# Patient Record
Sex: Male | Born: 1995
Health system: Southern US, Community
[De-identification: ages and names within clinical notes are randomized; demographics above are authoritative.]

## PROBLEM LIST (undated history)

## (undated) DIAGNOSIS — G43909 Migraine, unspecified, not intractable, without status migrainosus: Secondary | ICD-10-CM

## (undated) DIAGNOSIS — E119 Type 2 diabetes mellitus without complications: Secondary | ICD-10-CM

## (undated) DIAGNOSIS — F259 Schizoaffective disorder, unspecified: Secondary | ICD-10-CM

---

## 2020-08-25 ENCOUNTER — Encounter (HOSPITAL_COMMUNITY): Payer: Self-pay | Admitting: Emergency Medicine

## 2020-08-25 ENCOUNTER — Emergency Department (HOSPITAL_COMMUNITY): Payer: Managed Care, Other (non HMO)

## 2020-08-25 ENCOUNTER — Ambulatory Visit (HOSPITAL_COMMUNITY)
Admission: EM | Admit: 2020-08-25 | Discharge: 2020-08-25 | Disposition: A | Discharge status: Other Acute Inpatient Hospital | Payer: Managed Care, Other (non HMO)

## 2020-08-25 ENCOUNTER — Other Ambulatory Visit: Payer: Self-pay

## 2020-08-25 ENCOUNTER — Emergency Department (HOSPITAL_COMMUNITY)
Admission: EM | Admit: 2020-08-25 | Discharge: 2020-08-25 | Discharge status: Against Medical Advice | Payer: Managed Care, Other (non HMO) | Attending: Emergency Medicine | Admitting: Emergency Medicine

## 2020-08-25 DIAGNOSIS — R45850 Homicidal ideations: Secondary | ICD-10-CM | POA: Diagnosis not present

## 2020-08-25 DIAGNOSIS — R45851 Suicidal ideations: Secondary | ICD-10-CM | POA: Diagnosis not present

## 2020-08-25 DIAGNOSIS — F29 Unspecified psychosis not due to a substance or known physiological condition: Secondary | ICD-10-CM | POA: Diagnosis not present

## 2020-08-25 DIAGNOSIS — Z20822 Contact with and (suspected) exposure to covid-19: Secondary | ICD-10-CM | POA: Insufficient documentation

## 2020-08-25 DIAGNOSIS — R519 Headache, unspecified: Secondary | ICD-10-CM | POA: Insufficient documentation

## 2020-08-25 DIAGNOSIS — Z0279 Encounter for issue of other medical certificate: Secondary | ICD-10-CM | POA: Diagnosis present

## 2020-08-25 DIAGNOSIS — F23 Brief psychotic disorder: Secondary | ICD-10-CM | POA: Diagnosis not present

## 2020-08-25 HISTORY — DX: Migraine, unspecified, not intractable, without status migrainosus: G43.909

## 2020-08-25 LAB — COMPREHENSIVE METABOLIC PANEL
ALT: 17 U/L (ref 0–44)
AST: 21 U/L (ref 15–41)
Albumin: 4.2 g/dL (ref 3.5–5.0)
Alkaline Phosphatase: 59 U/L (ref 38–126)
Anion gap: 11 (ref 5–15)
BUN: 12 mg/dL (ref 6–20)
CO2: 26 mmol/L (ref 22–32)
Calcium: 9.1 mg/dL (ref 8.9–10.3)
Chloride: 101 mmol/L (ref 98–111)
Creatinine, Ser: 0.92 mg/dL (ref 0.61–1.24)
GFR, Estimated: >60 mL/min (ref >60)
Glucose, Bld: 95 mg/dL (ref 70–99)
Potassium: 4.1 mmol/L (ref 3.5–5.1)
Sodium: 138 mmol/L (ref 135–145)
Total Bilirubin: 0.7 mg/dL (ref 0.3–1.2)
Total Protein: 7 g/dL (ref 6.5–8.1)

## 2020-08-25 LAB — CBC
HCT: 45.6 % (ref 39.0–52.0)
Hemoglobin: 14.8 g/dL (ref 13.0–17.0)
MCH: 28.5 pg (ref 26.0–34.0)
MCHC: 32.5 g/dL (ref 30.0–36.0)
MCV: 87.7 fL (ref 80.0–100.0)
Platelets: 293 10*3/uL (ref 150–400)
RBC: 5.2 MIL/uL (ref 4.22–5.81)
RDW: 12.1 % (ref 11.5–15.5)
WBC: 8.2 10*3/uL (ref 4.0–10.5)
nRBC: 0 % (ref 0.0–0.2)

## 2020-08-25 LAB — RESPIRATORY PANEL BY RT PCR (FLU A&B, COVID)
Influenza A by PCR: NEGATIVE
Influenza B by PCR: NEGATIVE
SARS Coronavirus 2 by RT PCR: NEGATIVE

## 2020-08-25 LAB — ACETAMINOPHEN LEVEL: Acetaminophen (Tylenol), Serum: <10 ug/mL — ABNORMAL LOW (ref 10–30)

## 2020-08-25 LAB — SALICYLATE LEVEL: Salicylate Lvl: <7 mg/dL — ABNORMAL LOW (ref 7.0–30.0)

## 2020-08-25 LAB — RAPID URINE DRUG SCREEN, HOSP PERFORMED
Amphetamines: NOT DETECTED
Barbiturates: NOT DETECTED
Benzodiazepines: NOT DETECTED
Cocaine: NOT DETECTED
Opiates: NOT DETECTED
Tetrahydrocannabinol: NOT DETECTED

## 2020-08-25 LAB — TSH: TSH: 1.019 u[IU]/mL (ref 0.350–4.500)

## 2020-08-25 LAB — ETHANOL: Alcohol, Ethyl (B): <10 mg/dL (ref <10)
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
## 2020-08-25 NOTE — ED Notes (Signed)
Pt sent to [facility] via safe transport for medical clearance. Safety maintained.

## 2020-08-25 NOTE — ED Triage Notes (Signed)
24 yo caucasian male presents with family stating, "I have a lot of pressure in my head. I feel like my brain expands and decreases. This started about a little over a month now. When the migraines come on, I start having spiritual encounters as if electricity is going through my body. The headaches are in the frontal lobes of my head and that's when the dissociation happens. I need to be more balanced". Pt's father states the pt have become more unbalanced stating the pt say that he is afraid he is going to hurt both of his parents, that the pt can touch people and burn them and that people are after him. The father reports that the pt think his parents are part of the Illuminati. Parents deny psychosis in family.

## 2020-08-25 NOTE — ED Notes (Signed)
Patient does not have a locker. 

## 2020-08-25 NOTE — ED Provider Notes (Signed)
[facility] EMERGENCY DEPARTMENT Provider Note   CSN: [id number] Arrival date & time: 08/25/20  1506     History Chief Complaint  Patient presents with   Migraine    Scott Vargas is a 24 y.o. male.  HPI Patient presents from behavioral health Hospital with psychosis. Sent for medical clearance. Patient states that he had a spiritual awakening in July of last year. States he felt as if lining went to his head and went through to his toes. Since then he has had headaches in the front of his head. States it feels that there is a throbbing in there. No numbness weakness. No confusion. Although he states he is also started having hallucinations where he will hear voices and sometimes see some things. Reviewing notes from behavioral health urgent care shows that he has had psychosis and reportedly has been worried that he would hurt his family. Reportedly is also made some homicidal and suicidal statements. Patient denies substance abuse. No previous psychiatric history. Patient states he is always been spiritual. Denies drug use.    Past Medical History:  Diagnosis Date   Migraine     There are no problems to display for this patient.        No family history on file.  Social History   Tobacco Use   Smoking status: Not on file  Substance Use Topics   Alcohol use: Not on file   Drug use: Not on file    Home Medications Prior to Admission medications   Not on File    Allergies    Patient has no known allergies.  Review of Systems   Review of Systems  Constitutional: Negative for chills and fever.  HENT: Negative for congestion.   Respiratory: Negative for shortness of breath.   Cardiovascular: Negative for chest pain.  Gastrointestinal: Negative for abdominal pain.  Genitourinary: Negative for flank pain.  Musculoskeletal: Negative for back pain.  Skin: Negative for rash.  Neurological: Positive for headaches. Negative for weakness.   Hematological: Does not bruise/bleed easily.  Psychiatric/Behavioral: Positive for hallucinations and suicidal ideas.    Physical Exam Updated Vital Signs BP (!) 160/106 (BP Location: Right Arm)    Pulse 88    Temp 98.5 F (36.9 C) (Oral)    Resp 16    Ht 5\' 10"  (1.778 m)    Wt 77.1 kg    SpO2 99%    BMI 24.39 kg/m   Physical Exam Vitals and nursing note reviewed.  Constitutional:      Appearance: Normal appearance.  HENT:     Head: Atraumatic.  Eyes:     Pupils: Pupils are equal, round, and reactive to light.  Cardiovascular:     Rate and Rhythm: Normal rate and regular rhythm.  Pulmonary:     Effort: Pulmonary effort is normal.  Abdominal:     Tenderness: There is no abdominal tenderness.  Skin:    General: Skin is warm.     Capillary Refill: Capillary refill takes less than 2 seconds.  Neurological:     Mental Status: He is alert and oriented to person, place, and time.  Psychiatric:     Comments: Patient is awake and answers questions appropriately. Does not appear to be responding to internal stimuli.     ED Results / Procedures / Treatments   Labs (all labs ordered are listed, but only abnormal results are displayed) Labs Reviewed  SALICYLATE LEVEL - Abnormal; Notable for the following components:  Result Value   Salicylate Lvl <7.0 (*)    All other components within normal limits  ACETAMINOPHEN LEVEL - Abnormal; Notable for the following components:   Acetaminophen (Tylenol), Serum <10 (*)    All other components within normal limits  RESPIRATORY PANEL BY RT PCR (FLU A&B, COVID)  COMPREHENSIVE METABOLIC PANEL  ETHANOL  CBC  RAPID URINE DRUG SCREEN, HOSP PERFORMED  TSH    EKG None  Radiology CT Head Wo Contrast  Result Date: 08/25/2020 CLINICAL DATA:  Psychosis EXAM: CT HEAD WITHOUT CONTRAST TECHNIQUE: Contiguous axial images were obtained from the base of the skull through the vertex without intravenous contrast. COMPARISON:  None. FINDINGS:  Brain: No evidence of acute infarction, hemorrhage, hydrocephalus, extra-axial collection, visible mass lesion or mass effect. Vascular: No hyperdense vessel or unexpected calcification. Skull: No calvarial fracture or suspicious osseous lesion. No scalp swelling or hematoma. Sinuses/Orbits: Paranasal sinuses and mastoid air cells are predominantly clear. Included orbital structures are unremarkable. Other: None. IMPRESSION: Normal noncontrast CT of the head. Electronically Signed   By: Kreg Shropshire M.D.   On: 08/25/2020 19:41    Procedures Procedures (including critical care time)  Medications Ordered in ED Medications - No data to display  ED Course  I have reviewed the triage vital signs and the nursing notes.  Pertinent labs & imaging results that were available during my care of the patient were reviewed by me and considered in my medical decision making (see chart for details).    MDM Rules/Calculators/A&P                          Patient presents with psychosis.  Transferred from behavioral health urgent care.  New onset psychosis.  Risks of suicidal and homicidal.  Has been seen behavioral health.  Lab work reassuring.  Medically cleared.  To be seen by TTS and likely needs inpatient treatment. Final Clinical Impression(s) / ED Diagnoses Final diagnoses:  Psychosis, unspecified psychosis type [facility])    Rx / DC Orders ED Discharge Orders    None       Benjiman Core, MD 08/26/20 0025
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
## 2020-08-25 NOTE — ED Triage Notes (Signed)
Pt referred to ED by [facility] to get a CT to r/o any head problem. Pt initially when there to ask for some anxiety medication.

## 2020-08-25 NOTE — ED Notes (Signed)
Notified by other staff and brother that pt has left the facility. Pt ambulated out independently. Brother attempted to convince pt to stay but pt refused.

## 2020-08-25 NOTE — Discharge Instructions (Signed)
Discharge to the [facility]

## 2020-10-30 ENCOUNTER — Ambulatory Visit: Payer: 59 | Admitting: Psychiatry

## 2020-12-14 ENCOUNTER — Ambulatory Visit: Payer: Managed Care, Other (non HMO) | Admitting: Psychiatry

## 2020-12-27 ENCOUNTER — Ambulatory Visit: Payer: Managed Care, Other (non HMO) | Admitting: Neurology

## 2020-12-29 ENCOUNTER — Ambulatory Visit (INDEPENDENT_AMBULATORY_CARE_PROVIDER_SITE_OTHER): Payer: Managed Care, Other (non HMO) | Admitting: Psychiatry

## 2020-12-29 ENCOUNTER — Other Ambulatory Visit: Payer: Self-pay

## 2020-12-29 ENCOUNTER — Encounter: Payer: Self-pay | Admitting: Psychiatry

## 2020-12-29 VITALS — BP 145/85 | HR 75 | Ht 69.5 in | Wt 196.0 lb

## 2020-12-29 DIAGNOSIS — F259 Schizoaffective disorder, unspecified: Secondary | ICD-10-CM

## 2020-12-29 MED ORDER — DIVALPROEX SODIUM ER 500 MG PO TB24: ORAL_TABLET | ORAL | 1 refills | Status: DC

## 2020-12-29 NOTE — Progress Notes (Signed)
[facility] MD/PA/NP Initial Note  12/29/2020 9:24 AM Scott Vargas  MRN:  [id number]  Chief Complaint:  Chief Complaint    Anxiety; Paranoid; Schizoaffective Disorder      HPI:   Currently in IOP and was in PHP and psych hosp in Jan.   Paranoia and anxiety and head pressure which is annoying.  Haven't been able to connect to people.  Feels people are out to get him.  Super wound up and I need to chill out.  Only thing that helps is smoking weed and then only with friends.  Now only smokes cigars.   Social skills awful. Dream a lot.  Trazodone and prazosin help NM.   Sleep good 6-8 hours daily. Previously voice giving him ideas and thoughts.   July 25 ,20220 spirtiual encounter and it changed my brain. Way too emotional.   Then started [employer] and had intense spiritual experiences.   Depressed over the last couple of months just hanging out at home. 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   Voice is much better and not daily now.    Paranoia is better now gone.  Still stressed out going to the grocery store and super self aware. Had SI for a lot of his life and don't know why.  History of violent dreams.  Better place than a couple of months ago.  Sometimes violent thoughts but not now.  Was communicating threats a couple of months ago but never hurt anyone.  Does not want to hurt anyone now.   November 169# and now 195#.  Doing a litte working out.  Fatigue has limited working out.  Reviewed the meds.  When awakens feels a bit in a tunnel and it takes a while to wake up. Fatigue for months.  Downhill slide started Oct-November leading to hosptial at [facility].  Dx schizoaffective.    Some mental health issues his whole life but never this bad.     Visit Diagnosis: No diagnosis found.  Past Psychiatric History:  Past Psychiatric Medication Trials: Lorazepam Propranolol Lamotrigine No Depakote or  lithium  Past Medical History:  Past Medical History:  Diagnosis Date  . Migraine    History reviewed. No pertinent surgical history.  Family Psychiatric History: pat aunt bipolar   Family History: History reviewed. No pertinent family history. 3 brothers.  Social History:  Social History   Socioeconomic History  . Marital status: Single    Spouse name: Not on file  . Number of children: Not on file  . Years of education: Not on file  . Highest education level: Not on file  Occupational History  . Not on file  Tobacco Use  . Smoking status: Not on file  . Smokeless tobacco: Not on file  Substance and Sexual Activity  . Alcohol use: Not on file  . Drug use: Not on file  . Sexual activity: Not on file  Other Topics Concern  . Not on file  Social History Narrative  . Not on file   Social Determinants of Health   Financial Resource Strain: Not on file  Food Insecurity: Not on file  Transportation Needs: Not on file  Physical Activity: Not on file  Stress: Not on file  Social Connections: Not on file    Allergies: No Known Allergies  Metabolic Disorder Labs: No results found for: HGBA1C, MPG No results found for: PROLACTIN No results found for:  CHOL, TRIG, HDL, CHOLHDL, VLDL, LDLCALC Lab Results  Component Value Date   TSH 1.019 08/25/2020    Therapeutic Level Labs: No results found for: LITHIUM No results found for: VALPROATE No components found for:  CBMZ  Current Medications: Current Outpatient Medications  Medication Sig Dispense Refill  . hydrOXYzine (VISTARIL) 50 MG capsule Take 50 mg by mouth 3 (three) times daily as needed. 1 tab orally once a day.    . prazosin (MINIPRESS) 2 MG capsule Take 2 mg by mouth at bedtime. Pt is not sure about the mg of this medication    . risperiDONE (RISPERDAL) 2 MG tablet Take 2 mg by mouth at bedtime. 1/2 tablet once a week.    . traZODone (DESYREL) 100 MG tablet Take 100 mg by mouth at bedtime. Pt is not sure  about the mg of this medication.     No current facility-administered medications for this visit.    Medication Side Effects: fatigue/weakness  Orders placed this visit:  No orders of the defined types were placed in this encounter.   Psychiatric Specialty Exam:  Review of Systems  Constitutional: Positive for fatigue. Negative for appetite change, diaphoresis and fever.  HENT: Negative for congestion, hearing loss, tinnitus and voice change.   Eyes: Positive for photophobia. Negative for visual disturbance.  Respiratory: Negative for chest tightness, shortness of breath and wheezing.   Cardiovascular: Negative for chest pain and palpitations.  Gastrointestinal: Negative for constipation, diarrhea, nausea and vomiting.  Endocrine: Negative for polyuria.  Genitourinary: Positive for difficulty urinating. Negative for enuresis, frequency and hematuria.  Musculoskeletal: Negative for back pain, gait problem and neck pain.  Skin: Negative for rash.  Allergic/Immunologic: Negative for environmental allergies.  Neurological: Positive for headaches. Negative for tremors and syncope.  Psychiatric/Behavioral: Positive for agitation, confusion, decreased concentration, dysphoric mood and hallucinations. Negative for self-injury. The patient is nervous/anxious.     Blood pressure (!) 145/85, pulse 75, height 5' 9.5" (1.765 m), weight 196 lb (88.9 kg).Body mass index is 28.53 kg/m.  General Appearance: mildly disorganized  Eye Contact:  Fair  Speech:  Normal Rate and Talkative  Volume:  Normal  Mood:  Anxious and Dysphoric  Affect:  Restricted  Thought Process:  Goal Directed  Orientation:  Full (Time, Place, and Person)  Thought Content: Illogical, Hallucinations: Auditory and Paranoid Ideation   Suicidal Thoughts:  No  Homicidal Thoughts:  some intrusive violent thought without desire to harm anyone  Memory:  WNL  Judgement:  Fair  Insight:  Fair  Psychomotor Activity:  Restlessness   Concentration:  Concentration: Fair  Recall:  Good  Fund of Knowledge: Good  Language: Good  Assets:  Desire for Improvement Housing Intimacy Leisure Time Physical Health Social Support  ADL's:  Intact  Cognition: WNL  Prognosis:  Fair   Screenings: MDQ positive throughout.  Receiving Psychotherapy: IOP  Treatment Plan/Recommendations: Met with the patient alone to discuss diagnosis and treatment plan.  Then met again with the parents to discuss the same.  We will meet again next time to discuss this in more thoroughness.  Reviewed documents provided about hospitalization and medications.  Reviewed his recent hospital records and work-up.  This describes new onset of psychosis.  The patient was diagnosed with schizoaffective disorder which seems consistent with the combination of psychotic symptoms and some mood symptoms of irritability although the diagnosis may be further refined as time goes on.  He has had a partial response to [place] sustained injections monthly.  Discussed the  pros and cons of long-acting injectables.  It is important to keep his psychosis under control.  However it is not as well-controlled as we would like.  At follow-up we will discuss alternatives.  He is having some side effects.  Typically with schizoaffective disorder we will combine an antipsychotic with mood stabilizer.  He has some irritability and intrusive angry thoughts.  Depakote may be helpful for this.  We will initiate low-dose Depakote.  Discussed the side effects in detail.  His next Invega injection is due on March 12 and he will receive it.  He agrees to this plan.  Discussed potential metabolic side effects associated with atypical antipsychotics, as well as potential risk for movement side effects. Advised pt to contact office if movement side effects occur.   FU ASAP as workin bc of urgency  Lynder Parents, MD, DFAPA     Purnell Shoemaker, MD

## 2020-12-31 ENCOUNTER — Encounter: Payer: Self-pay | Admitting: Psychiatry

## 2021-01-03 ENCOUNTER — Ambulatory Visit: Payer: Managed Care, Other (non HMO) | Admitting: Psychiatry

## 2021-01-05 ENCOUNTER — Telehealth: Payer: Self-pay | Admitting: Psychiatry

## 2021-01-05 ENCOUNTER — Other Ambulatory Visit: Payer: Self-pay

## 2021-01-05 MED ORDER — TRAZODONE HCL 50 MG PO TABS
50.0000 mg | ORAL_TABLET | Freq: Every day | ORAL | 0 refills | Status: DC
Start: 2021-01-05 — End: 2021-04-03

## 2021-01-05 NOTE — Telephone Encounter (Signed)
Rx sent 

## 2021-01-05 NOTE — Telephone Encounter (Signed)
Pt would like a refill for Trazadone 50mg . Please send to [facility] [address] in [place], [place].  Father said that pt had his first appt last week and that all his medications will be handled by Dr. [place].

## 2021-01-12 ENCOUNTER — Ambulatory Visit (INDEPENDENT_AMBULATORY_CARE_PROVIDER_SITE_OTHER): Payer: 59 | Admitting: Psychiatry

## 2021-01-12 ENCOUNTER — Other Ambulatory Visit: Payer: Self-pay | Admitting: Psychiatry

## 2021-01-12 ENCOUNTER — Other Ambulatory Visit: Payer: Self-pay

## 2021-01-12 ENCOUNTER — Encounter: Payer: Self-pay | Admitting: Psychiatry

## 2021-01-12 DIAGNOSIS — F515 Nightmare disorder: Secondary | ICD-10-CM | POA: Diagnosis not present

## 2021-01-12 DIAGNOSIS — F259 Schizoaffective disorder, unspecified: Secondary | ICD-10-CM

## 2021-01-12 MED ORDER — INVEGA SUSTENNA 234 MG/1.5ML IM SUSY: 234.0000 mg | PREFILLED_SYRINGE | Freq: Once | INTRAMUSCULAR | 0 refills | Status: DC

## 2021-01-12 MED ORDER — HYDROXYZINE PAMOATE 50 MG PO CAPS: 50.0000 mg | ORAL_CAPSULE | Freq: Three times a day (TID) | ORAL | 0 refills | Status: DC | PRN

## 2021-01-12 MED ORDER — PRAZOSIN HCL 2 MG PO CAPS: 2.0000 mg | ORAL_CAPSULE | Freq: Every day | ORAL | 0 refills | Status: DC

## 2021-01-12 MED ORDER — DIVALPROEX SODIUM ER 500 MG PO TB24: ORAL_TABLET | ORAL | 1 refills | Status: DC

## 2021-01-12 NOTE — Progress Notes (Signed)
Scott Vargas [id number] 09-21-1996 25 y.o.  Subjective:   Patient ID:  Scott Vargas is a 25 y.o. (DOB [birth year]) male.  Chief Complaint:  Chief Complaint  Patient presents with  . Schizoaffective disorder, unspecified type (HCC)  . Follow-up    HPI Scott Vargas presents to the office today for follow-up of schizoaffective disorder with recent hospitalizations. First seen in our office 12/29/2020.  His symptoms were improved but not resolved and included depression, paranoia, intrusive violent images, chronic suicidal thoughts. He was just out of the hospital so no medications were in changed which included Invega IM every 4 weeks, risperidone 2 mg nightly, prazosin 2 mg nightly, trazodone 100 mg nightly, hydroxyzine 50 mg 3 times daily as needed  01/12/2021 appointment with the following noted: Working and going pretty smooth.  Scott Vargas out with friends and it went better than I thought. 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   No pot use since here. Mood pretty chill. Still gets voices negative and positive like "people don't like you".  Voices come and go.  Dealt with it a long time but never told anyone.  Better if busy.   Not sure about character of voices.  They come to me.  Worse when not busy and any time of day.  Voices are frustrating.  Voices got worse July 2020 in sprittual experience.  Not depressed.  Pleased friends accepted him better than expected. Attending young adults group at [facility] which is more centered spiriitually.   Still seeing therapist. Some people bother him but not sure why.  Trying to make new friends. In [place] got 2 restraining orders from 2 different churches.  No outstanding charges. Alcohol 2 -3 per week. Started Depakote without problems.  Pretty well with anger. Sleep OK with mixed type.  NM awaken him once weekly. No SE.  Then with parents Scott Vargas & dad Not as depressed as he was. M & F notes increase anxiety as got closer to injection  date.  F sees it's "night and day"  Difference and I have my son back. M agrees he's exhibited bravery in doing difficult things. Last injection date March 12- day outpatient [facility] [place] [place].   Past Psychiatric Medication Trials: History Dr. Krista Blue outpatient [facility] hosp 10/2020 Lorazepam Propranolol Lamotrigine Invega sustaina started Jan 2022 in hospital [facility] Risperidone 6 Abilify 10 NR and wt gain Lithium 1200 in 2020 level 0.8 Trazodone, hydroxyzine, prazosin No Depakote or lithium  Review of Systems:  Review of Systems  Cardiovascular: Negative for palpitations.  Neurological: Negative for tremors and weakness.    Medications: I have reviewed the patient's current medications.  Current Outpatient Medications  Medication Sig Dispense Refill  . traZODone (DESYREL) 50 MG tablet Take 1 tablet (50 mg total) by mouth at bedtime. 90 tablet 0  . divalproex (DEPAKOTE ER) 500 MG 24 hr tablet 2 at night 180 tablet 1  . hydrOXYzine (VISTARIL) 50 MG capsule Take 1 capsule (50 mg total) by mouth 3 (three) times daily as needed. 90 capsule 0  . paliperidone (INVEGA SUSTENNA) 234 MG/1.5ML SUSY injection Inject 234 mg into the muscle once for 1 dose. Every 3 weeks, next due 01/27/21 1.5 mL 0  . prazosin (MINIPRESS) 2 MG capsule Take 1 capsule (2 mg total) by mouth at bedtime. Pt is not sure about the mg of this medication 90 capsule 0  . risperiDONE (RISPERDAL) 2 MG tablet Take 2 mg by mouth at bedtime. 1/2 tablet once a week prn (Patient not  taking: Reported on 01/12/2021)     No current facility-administered medications for this visit.    Medication Side Effects: None  Allergies: No Known Allergies  Past Medical History:  Diagnosis Date  . Migraine     History reviewed. No pertinent family history.  Social History   Socioeconomic History  . Marital status: Single    Spouse name: Not on file  . Number of children: Not on file  . Years of education: Not on file  .  Highest education level: Not on file  Occupational History  . Not on file  Tobacco Use  . Smoking status: Light Tobacco Smoker  . Smokeless tobacco: Never Used  Substance and Sexual Activity  . Alcohol use: Not on file  . Drug use: Not on file  . Sexual activity: Not on file  Other Topics Concern  . Not on file  Social History Narrative  . Not on file   Social Determinants of Health   Financial Resource Strain: Not on file  Food Insecurity: Not on file  Transportation Needs: Not on file  Physical Activity: Not on file  Stress: Not on file  Social Connections: Not on file  Intimate Partner Violence: Not on file    Past Medical History, Surgical history, Social history, and Family history were reviewed and updated as appropriate.   Fraternal twin brother 2 other brothers  Please see review of systems for further details on the patient's review from today.   Objective:   Physical Exam:  There were no vitals taken for this visit.  Physical Exam Constitutional:      General: He is not in acute distress. Musculoskeletal:        General: No deformity.  Neurological:     Mental Status: He is alert and oriented to person, place, and time.     Coordination: Coordination normal.  Psychiatric:        Attention and Perception: He is inattentive. He perceives auditory hallucinations. He does not perceive visual hallucinations.        Mood and Affect: Mood is anxious. Mood is not depressed. Affect is not labile, blunt, angry, tearful or inappropriate.        Speech: Speech normal.        Behavior: Behavior normal.        Thought Content: Thought content is paranoid. Thought content is not delusional. Thought content does not include homicidal or suicidal ideation. Thought content does not include homicidal or suicidal plan.        Cognition and Memory: Cognition and memory normal.        Judgment: Judgment normal.     Comments: Insight intact Mild paranoia better Mild  inattention     Lab Review:     Component Value Date/Time   NA 138 08/25/2020 1522   K 4.1 08/25/2020 1522   CL 101 08/25/2020 1522   CO2 26 08/25/2020 1522   GLUCOSE 95 08/25/2020 1522   BUN 12 08/25/2020 1522   CREATININE 0.92 08/25/2020 1522   CALCIUM 9.1 08/25/2020 1522   PROT 7.0 08/25/2020 1522   ALBUMIN 4.2 08/25/2020 1522   AST 21 08/25/2020 1522   ALT 17 08/25/2020 1522   ALKPHOS 59 08/25/2020 1522   BILITOT 0.7 08/25/2020 1522   GFRNONAA >60 08/25/2020 1522       Component Value Date/Time   WBC 8.2 08/25/2020 1522   RBC 5.20 08/25/2020 1522   HGB 14.8 08/25/2020 1522   HCT 45.6 08/25/2020 1522  PLT 293 08/25/2020 1522   MCV 87.7 08/25/2020 1522   MCH 28.5 08/25/2020 1522   MCHC 32.5 08/25/2020 1522   RDW 12.1 08/25/2020 1522    No results found for: POCLITH, LITHIUM   No results found for: PHENYTOIN, PHENOBARB, VALPROATE, CBMZ   .res Assessment: Plan:    Jarrah was seen today for schizoaffective disorder, unspecified type (hcc) and follow-up.  Diagnoses and all orders for this visit:  Schizoaffective disorder, unspecified type (HCC) -     divalproex (DEPAKOTE ER) 500 MG 24 hr tablet; 2 at night -     paliperidone (INVEGA SUSTENNA) 234 MG/1.5ML SUSY injection; Inject 234 mg into the muscle once for 1 dose. Every 3 weeks, next due 01/27/21 -     Discontinue: hydrOXYzine (VISTARIL) 50 MG capsule; Take 1 capsule (50 mg total) by mouth 3 (three) times daily as needed. 1 tab orally once a day.  Nightmare -     prazosin (MINIPRESS) 2 MG capsule; Take 1 capsule (2 mg total) by mouth at bedtime. Pt is not sure about the mg of this medication   Greater than 50% of 60 min face to face time with patient was spent on counseling and coordination of care. We extensively discussed the new diagnosis of schizoaffective disorder with the patient and his parents.  We discussed prognosis.  We discussed factors such as marijuana use which could worsen outcome.  We discussed  the importance of med compliance and how that can change future outcomes with psychotic symptoms as well as relapse risk.  We discussed side effects of antipsychotic medications.  We discussed the trial and error nature of finding the optimal antipsychotic at optimal dose.  Answered other questions that the patient's parents had  Overall his psychotic symptoms are much improved but not resolved.   Normal Head CT scan.  They asked whether further neuro work-up was indicated and there does not appear to be focal neurologic signs or symptoms to suggest that.  IOP is winding down next week. Rec continued therapy individually.  Support groups [facility], [facility], [facility]  Continue meds.  Get next [place] 1 week early by 1 week for a couple of mos DT observed wearing off.  We discussed that it can take several months for long-acting injectables to have optimal effect and reach steady state.  He is tolerating the antipsychotic well.  He is tolerating the Depakote well and is not currently having violent thoughts   Tobacco use only rarely bc DDI  Supportive therapy for pt and family dealing with his new onset psychosis.  FU 6 weeks  Meredith Staggers, MD, DFAPA   Please see After Visit Summary for patient specific instructions.  Future Appointments  Date Time Provider Department Center  03/07/2021  1:30 PM Cottle, Steva Ready., MD [facility] None    No orders of the defined types were placed in this encounter.   -------------------------------

## 2021-01-15 NOTE — Telephone Encounter (Signed)
Can you update directions

## 2021-01-27 ENCOUNTER — Other Ambulatory Visit: Payer: Self-pay | Admitting: Psychiatry

## 2021-01-27 DIAGNOSIS — F259 Schizoaffective disorder, unspecified: Secondary | ICD-10-CM

## 2021-02-05 ENCOUNTER — Ambulatory Visit (INDEPENDENT_AMBULATORY_CARE_PROVIDER_SITE_OTHER): Payer: 59

## 2021-02-05 ENCOUNTER — Other Ambulatory Visit: Payer: Self-pay

## 2021-02-05 ENCOUNTER — Telehealth: Payer: Self-pay | Admitting: Psychiatry

## 2021-02-05 DIAGNOSIS — F259 Schizoaffective disorder, unspecified: Secondary | ICD-10-CM | POA: Diagnosis not present

## 2021-02-05 NOTE — Telephone Encounter (Signed)
Rtc to Dad and discussed injection. They do physically have the medication and can bring in for his injection. We decided for him to come at 1 pm today and will get pt scheduled every 28-30 days there after.

## 2021-02-05 NOTE — Telephone Encounter (Signed)
Please review

## 2021-02-05 NOTE — Telephone Encounter (Signed)
Pt father called and said that they went to [facility] to get pt's Invega injection, but the order that was sent to [facility] was for 156mg  and the rx was for 234mg . Pt would like to know if they can come in today to get the injection administered. Pt can't go any longer without it.

## 2021-02-05 NOTE — Progress Notes (Signed)
Nurse Note:  Patient here for his [place] 234 mg/1.5 ml injection, he received in his right upper gluteal muscle. Pt tolerated well voiced no complaints. Pt is due for the next injection in 3 weeks, scheduled 02/26/2021 @ 1 pm. Pt brings in medication from his pharmacy.   EXP: 05/2022 LOT [id number]

## 2021-02-05 NOTE — Telephone Encounter (Signed)
Agreed - thank you.

## 2021-02-11 ENCOUNTER — Other Ambulatory Visit: Payer: Self-pay | Admitting: Psychiatry

## 2021-02-11 DIAGNOSIS — F259 Schizoaffective disorder, unspecified: Secondary | ICD-10-CM

## 2021-02-13 ENCOUNTER — Ambulatory Visit: Payer: Managed Care, Other (non HMO) | Admitting: Psychiatry

## 2021-02-14 NOTE — Telephone Encounter (Signed)
Never mind on the question.  I see he is on the schedule for May 2.

## 2021-02-14 NOTE — Telephone Encounter (Signed)
Please review

## 2021-02-14 NOTE — Telephone Encounter (Signed)
Is he on the schedule for next injection?

## 2021-02-26 ENCOUNTER — Ambulatory Visit (INDEPENDENT_AMBULATORY_CARE_PROVIDER_SITE_OTHER): Payer: 59

## 2021-02-26 ENCOUNTER — Other Ambulatory Visit: Payer: Self-pay

## 2021-02-26 DIAGNOSIS — F259 Schizoaffective disorder, unspecified: Secondary | ICD-10-CM

## 2021-02-26 NOTE — Progress Notes (Signed)
Nurse Note: Pt here for his Invega [place] 234 mg/1.5 ml injection, pt returned after 3 weeks per Dr. Jennelle Human order. Pt has apt next weed on May 11 th to discuss next apt for injection. Pt received injection on Rt upper gluteal area. Tolerated well. Will set up apt following his apt with the doctor.   EXP 06-2022 LOT [id number]

## 2021-03-07 ENCOUNTER — Encounter: Payer: Self-pay | Admitting: Psychiatry

## 2021-03-07 ENCOUNTER — Ambulatory Visit (INDEPENDENT_AMBULATORY_CARE_PROVIDER_SITE_OTHER): Payer: 59 | Admitting: Psychiatry

## 2021-03-07 ENCOUNTER — Other Ambulatory Visit: Payer: Self-pay

## 2021-03-07 DIAGNOSIS — F259 Schizoaffective disorder, unspecified: Secondary | ICD-10-CM

## 2021-03-07 DIAGNOSIS — F515 Nightmare disorder: Secondary | ICD-10-CM | POA: Diagnosis not present

## 2021-03-07 NOTE — Patient Instructions (Addendum)
Trial L-carnitine 1000 mg 1 twice daily to see if energy and focus are better.

## 2021-03-07 NOTE — Progress Notes (Signed)
Scott Vargas [id number] [birth year] 24 y.o.  Subjective:   Patient ID:  Scott Vargas is a 25 y.o. (DOB 31-Mar-1996) male.  Chief Complaint:  Chief Complaint  Patient presents with  . Follow-up  . Schizoaffective disorder, unspecified type (HCC)    HPI Scott Vargas presents to the office today for follow-up of schizoaffective disorder with recent hospitalizations. First seen in our office 12/29/2020.  His symptoms were improved but not resolved and included depression, paranoia, intrusive violent images, chronic suicidal thoughts. He was just out of the hospital so no medications were in changed which included Invega IM every 4 weeks, risperidone 2 mg nightly, prazosin 2 mg nightly, trazodone 100 mg nightly, hydroxyzine 50 mg 3 times daily as needed  01/12/2021 appointment with the following noted: Working and going pretty smooth.  Scott Vargas out with friends and it went better than I thought. 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   No pot use since here. Mood pretty chill. Still gets voices negative and positive like "people don't like you".  Voices come and go.  Dealt with it a long time but never told anyone.  Better if busy.   Not sure about character of voices.  They come to me.  Worse when not busy and any time of day.  Voices are frustrating.  Voices got worse July 2020 in sprittual experience.  Not depressed.  Pleased friends accepted him better than expected. Attending young adults group at [facility] which is more centered spiriitually.   Still seeing therapist. Some people bother him but not sure why.  Trying to make new friends. In [place] got 2 restraining orders from 2 different churches.  No outstanding charges. Alcohol 2 -3 per week. Started Depakote without problems.  Pretty well with anger. Sleep OK with mixed type.  NM awaken him once weekly. No SE.  Then with parents Scott Vargas & dad Not as depressed as he was. M & F notes increase anxiety as got closer to injection  date.  F sees it's "night and day"  Difference and I have my son back. M agrees he's exhibited bravery in doing difficult things. Last injection date March 12- day outpatient [facility] [place].  Plan: Get next [name] 1 week early by 1 week for a couple of mos DT observed wearing off.  03/07/2021 appointment with the following noted:  Alone and with Mother Last received Invega Sustenna 234 mg per 1.5 mL injection on 02/26/2021.  Last 2 injections have it 3-week intervals instead of typical 4-week intervals bc of ongoing psychotic sx.  Doing well and exercising and biking.  Doing ok with voices.  Still hear them somewhat but less prominent and ignoring them more.  Mood pretty calm.  Not lately much fear or anxiety.  Maybe a little better with small talk with people. 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 No problems with meds.  Sometimes lower motivation.    Less hydroxyzine 1/week.   Dreams good and bad still.  Rarely will be awakened by them. No anger outbursts. Mild paranoia out in public about what people are thinking.  With Mom:  Superb cp to 2 mos ago.  Some days lacks motivation.  Better on days he works.   Past Psychiatric Medication Trials: History Dr. Krista Blue outpatient [facility] hosp 10/2020 Lorazepam Propranolol Lamotrigine Invega sustaina started Jan 2022 in hospital [facility] Risperidone 6 Abilify 10 NR and wt gain Lithium 1200 in 2020 level 0.8 Trazodone, hydroxyzine, prazosin lithium Depakote 1000 started 12/29/20  Review  of Systems:  Review of Systems  Constitutional: Positive for fatigue.  Cardiovascular: Negative for palpitations.  Neurological: Negative for tremors and weakness.    Medications: I have reviewed the patient's current medications.  Current Outpatient Medications  Medication Sig Dispense Refill  . divalproex (DEPAKOTE ER) 500 MG 24 hr tablet 2 at night 180 tablet 1  . hydrOXYzine (VISTARIL) 50 MG capsule Take 1 capsule (50 mg  total) by mouth 3 (three) times daily as needed. 90 capsule 0  . prazosin (MINIPRESS) 2 MG capsule Take 1 capsule (2 mg total) by mouth at bedtime. Pt is not sure about the mg of this medication 90 capsule 0  . traZODone (DESYREL) 50 MG tablet Take 1 tablet (50 mg total) by mouth at bedtime. 90 tablet 0  . paliperidone (INVEGA SUSTENNA) 234 MG/1.5ML SUSY injection Inject 234 mg into the muscle once for 1 dose. Every 3 weeks, next due 02/26/21 1.5 mL 1  . risperiDONE (RISPERDAL) 2 MG tablet Take 2 mg by mouth at bedtime. 1/2 tablet once a week prn (Patient not taking: No sig reported)     No current facility-administered medications for this visit.    Medication Side Effects: None  Allergies: No Known Allergies  Past Medical History:  Diagnosis Date  . Migraine     History reviewed. No pertinent family history.  Social History   Socioeconomic History  . Marital status: Single    Spouse name: Not on file  . Number of children: Not on file  . Years of education: Not on file  . Highest education level: Not on file  Occupational History  . Not on file  Tobacco Use  . Smoking status: Light Tobacco Smoker  . Smokeless tobacco: Never Used  Substance and Sexual Activity  . Alcohol use: Not on file  . Drug use: Not on file  . Sexual activity: Not on file  Other Topics Concern  . Not on file  Social History Narrative  . Not on file   Social Determinants of Health   Financial Resource Strain: Not on file  Food Insecurity: Not on file  Transportation Needs: Not on file  Physical Activity: Not on file  Stress: Not on file  Social Connections: Not on file  Intimate Partner Violence: Not on file    Past Medical History, Surgical history, Social history, and Family history were reviewed and updated as appropriate.   Fraternal twin brother 2 other brothers  Please see review of systems for further details on the patient's review from today.   Objective:   Physical Exam:   There were no vitals taken for this visit.  Physical Exam Constitutional:      General: He is not in acute distress. Musculoskeletal:        General: No deformity.  Neurological:     Mental Status: He is alert and oriented to person, place, and time.     Coordination: Coordination normal.  Psychiatric:        Attention and Perception: He is inattentive. He perceives auditory hallucinations. He does not perceive visual hallucinations.        Mood and Affect: Mood is not anxious or depressed. Affect is not labile, blunt, angry, tearful or inappropriate.        Speech: Speech normal.        Behavior: Behavior normal.        Thought Content: Thought content is paranoid. Thought content is not delusional. Thought content does not include homicidal or suicidal  ideation. Thought content does not include homicidal or suicidal plan.        Cognition and Memory: Cognition and memory normal.        Judgment: Judgment normal.     Comments: Insight intact Mild paranoia gradually better  Mild inattention     Lab Review:     Component Value Date/Time   NA 138 08/25/2020 1522   K 4.1 08/25/2020 1522   CL 101 08/25/2020 1522   CO2 26 08/25/2020 1522   GLUCOSE 95 08/25/2020 1522   BUN 12 08/25/2020 1522   CREATININE 0.92 08/25/2020 1522   CALCIUM 9.1 08/25/2020 1522   PROT 7.0 08/25/2020 1522   ALBUMIN 4.2 08/25/2020 1522   AST 21 08/25/2020 1522   ALT 17 08/25/2020 1522   ALKPHOS 59 08/25/2020 1522   BILITOT 0.7 08/25/2020 1522   GFRNONAA >60 08/25/2020 1522       Component Value Date/Time   WBC 8.2 08/25/2020 1522   RBC 5.20 08/25/2020 1522   HGB 14.8 08/25/2020 1522   HCT 45.6 08/25/2020 1522   PLT 293 08/25/2020 1522   MCV 87.7 08/25/2020 1522   MCH 28.5 08/25/2020 1522   MCHC 32.5 08/25/2020 1522   RDW 12.1 08/25/2020 1522    No results found for: POCLITH, LITHIUM   No results found for: PHENYTOIN, PHENOBARB, VALPROATE, CBMZ   .res Assessment: Plan:    [place] was  seen today for follow-up and schizoaffective disorder, unspecified type (hcc).  Diagnoses and all orders for this visit:  Schizoaffective disorder, unspecified type (HCC)  Nightmare   Greater than 50% of 60 min face to face time with patient was spent on counseling and coordination of care. We extensively discussed the new diagnosis of schizoaffective disorder with the patient and his mom.  We discussed prognosis.  We discussed factors such as marijuana use which could worsen outcome.  We discussed the importance of med compliance and how that can change future outcomes with psychotic symptoms as well as relapse risk.  We discussed side effects of antipsychotic medications.  We discussed the trial and error nature of finding the optimal antipsychotic at optimal dose.    Overall his psychotic symptoms are much improved but not resolved. Improving paranoia and voices.   Normal Head CT scan.  They asked whether further neuro work-up was indicated and there does not appear to be focal neurologic signs or symptoms to suggest that.  Start counseling with Elio Forget when possible.  Support groups [facility], [facility], [facility].  Going to [employer] support group by Zoom a couple of times.  Faith based.  Continue meds.  Change Invega Sustaina back to every 4 weeks at 234mg  q weeks DT observed wearing off.  We discussed that it can take several months for long-acting injectables to have optimal effect and reach steady state.  He is tolerating the antipsychotic well.  He is tolerating the Depakote well and is not currently having violent thoughts .  Uncertain yet if this will be needed the longer he takes the .   Trial L-carnitine 1000 mg 1 twice daily to see if energy and focus are better.  Disc dietary issues and risk weight gain.  Discussed potential metabolic side effects associated with atypical antipsychotics, as well as potential risk for movement side effects. Advised pt to contact office if  movement side effects occur.    Tobacco use only rarely bc DDI.  Avoid THC.    Supportive therapy for pt and family dealing  with his new onset psychosis.  FU 6-8 weeks  Meredith Staggers, MD, DFAPA   Please see After Visit Summary for patient specific instructions.  No future appointments.  No orders of the defined types were placed in this encounter.   -------------------------------

## 2021-04-02 ENCOUNTER — Other Ambulatory Visit: Payer: Self-pay

## 2021-04-02 ENCOUNTER — Ambulatory Visit (INDEPENDENT_AMBULATORY_CARE_PROVIDER_SITE_OTHER): Payer: 59

## 2021-04-02 DIAGNOSIS — F259 Schizoaffective disorder, unspecified: Secondary | ICD-10-CM | POA: Diagnosis not present

## 2021-04-02 NOTE — Progress Notes (Signed)
Nurse Note: Pt here for his Gean Birchwood 234 mg/1.5 ml injection, pt is scheduled every4 weeks/28 days per Dr. Jennelle Human last office visit. Pt received injection on Left upper gluteal area. Tolerated well. Advised to set up next apt in 28-30 days. Pt voices no complaints or issues.   EXP 08-2022 LOT [id number]

## 2021-04-03 ENCOUNTER — Other Ambulatory Visit: Payer: Self-pay | Admitting: Psychiatry

## 2021-04-03 DIAGNOSIS — F515 Nightmare disorder: Secondary | ICD-10-CM

## 2021-04-17 ENCOUNTER — Other Ambulatory Visit: Payer: Self-pay | Admitting: Psychiatry

## 2021-04-17 DIAGNOSIS — F259 Schizoaffective disorder, unspecified: Secondary | ICD-10-CM

## 2021-04-23 ENCOUNTER — Ambulatory Visit: Payer: 59 | Admitting: Mental Health

## 2021-05-01 ENCOUNTER — Ambulatory Visit: Payer: 59

## 2021-05-04 ENCOUNTER — Other Ambulatory Visit: Payer: Self-pay

## 2021-05-04 ENCOUNTER — Ambulatory Visit (INDEPENDENT_AMBULATORY_CARE_PROVIDER_SITE_OTHER): Payer: Self-pay

## 2021-05-04 DIAGNOSIS — F259 Schizoaffective disorder, unspecified: Secondary | ICD-10-CM

## 2021-05-04 NOTE — Progress Notes (Signed)
Nurse Note: Pt here for his Gean Birchwood 234 mg/1.5 ml injection, pt is scheduled every4 weeks/28 days per Dr. Jennelle Human last office visit. Pt received injection on Left upper gluteal area. Tolerated well. Advised to set up next apt in 28-30 days. Pt voices no complaints or issues.    EXP 09-2022 LOT [id number]

## 2021-05-28 ENCOUNTER — Ambulatory Visit: Payer: 59

## 2021-05-28 ENCOUNTER — Ambulatory Visit: Payer: 59 | Admitting: Psychiatry

## 2021-05-29 ENCOUNTER — Ambulatory Visit (INDEPENDENT_AMBULATORY_CARE_PROVIDER_SITE_OTHER): Payer: BC Managed Care – PPO | Admitting: Mental Health

## 2021-05-29 ENCOUNTER — Other Ambulatory Visit: Payer: Self-pay

## 2021-05-29 DIAGNOSIS — F259 Schizoaffective disorder, unspecified: Secondary | ICD-10-CM | POA: Diagnosis not present

## 2021-05-29 NOTE — Progress Notes (Signed)
[facility] Counsel[facility] Initial Adult Exam  Name: Scott Vargas Date: 05/29/2021 MRN: [id number] DOB: 30-Oct-1995 PCP: [name], MD  Time spent: 53 minutes  Reason for Visit /Presenting Problem:  Patient stated he had a "breakdown" last Fall, was inpatient psychiatric at [facility]. He began seeing Dr. Jennelle Human for aftercare. Prior to admission, reported having paranoia, disconnected from reality. Was occurring for about 2 years, increasingly last November to December. Reports his thinking became more irrational thinking. An example is "I was believing in an alternate reality, what my friends were thinking about me". He stated he has some religious preoccupation prior to admission, realizing his thinking was irrational.  He stated he continues to cope w/ anxiety and depression, anxiety primary. He has made attempts to get out more w/ friends, went to play pool recently w/ friends. He was struggling to be around crowds but this has improved. Lost some friendships due to challenges w/ his mental health. Symptoms at that time, paranoia were at night primarily, then in the day as they intensified.  When around friends, strangers, he has difficulty relaxing, this has always been challenging. Playing video games at home he is calm. Reports his aunt copes w/ bipolar.  He has 2 brothers who live in [place], [place]; patient lives w/ his parents and twin brother. He is trying to find new work as he currently has worked in Aeronautical engineer. Feels he needs to be busier, working full time "I think I would feel better about myself". He tries to remind himself "this is just for now", how he can make changes.  He has an associates in Clinical research associate. Plans to apply in manufacturing related industry.   Mental Status Exam:    Appearance:    Casual     Behavior:   Appropriate  Motor:   WNL  Speech/Language:    Clear and Coherent  Affect:   Full range   Mood:   Anxious, pleasant  Thought process:   normal   Thought content:     WNL  Sensory/Perceptual disturbances:     none  Orientation:   x4  Attention:   Good  Concentration:   Good  Memory:   Intact  Fund of knowledge:    Consistent with age and development  Insight:     Good  Judgment:    Good  Impulse Control:   Good     Reported Symptoms:  anxiety, depression, rumination, avoidant behavior (crowds at times)  Risk Assessment: Danger to Self:  No Self-injurious Behavior: No Danger to Others: No Duty to Warn:no Physical Aggression / Violence:No  Access to Firearms a concern: No  Gang Involvement:No  Patient / guardian was educated about steps to take if suicide or homicide risk level increases between visits: yes While future psychiatric events cannot be accurately predicted, the patient does not currently require acute inpatient psychiatric care and does not currently meet [facility] involuntary commitment criteria.  Substance Abuse History: Current substance abuse: none   Past Psychiatric History:   Outpatient Providers: Dr Jennelle Human History of Psych Hospitalization:  [facility], [place] [address] Psychological Testing: none  Abuse History: Victim - none Report needed: No. Victim of Neglect:No. Perpetrator of  -none   Witness / Exposure to Domestic Violence: No   Protective Services Involvement: No  Witness to [employer] Violence:  No   Family History: No family history on file.  Living situation: the patient lives with their family  Legal History: Pending legal issue / charges: none History of legal  issue / charges: none  Medical History/Surgical History: Past Medical History:  Diagnosis Date   Migraine      Medications: Current Outpatient Medications  Medication Sig Dispense Refill   divalproex (DEPAKOTE ER) 500 MG 24 hr tablet 2 at night 180 tablet 1   hydrOXYzine (VISTARIL) 50 MG capsule Take 1 capsule (50 mg total) by mouth 3 (three) times daily as needed. 90 capsule 0   paliperidone (INVEGA SUSTENNA) 234  MG/1.5ML SUSY injection Inject 234 mg into the muscle once for 1 dose. Every 4 weeks 1.5 mL 1   prazosin (MINIPRESS) 2 MG capsule TAKE 1 CAPSULE (2 MG TOTAL) BY MOUTH AT BEDTIME. 90 capsule 0   risperiDONE (RISPERDAL) 2 MG tablet Take 2 mg by mouth at bedtime. 1/2 tablet once a week prn (Patient not taking: No sig reported)     traZODone (DESYREL) 50 MG tablet TAKE 1 TABLET BY MOUTH EVERYDAY AT BEDTIME 90 tablet 0   No current facility-administered medications for this visit.    No Known Allergies  Diagnoses:    ICD-10-CM   1. Schizoaffective disorder, unspecified type (HCC)  F25.9       Plan of Care: TBD   Waldron Session, [facility]

## 2021-05-30 ENCOUNTER — Telehealth: Payer: Self-pay

## 2021-05-30 NOTE — Telephone Encounter (Signed)
Pt was in for his counseling apt with Elio Forget, [facility] yesterday, August 2nd and reports he forgot he had a scheduled apt with Dr. Jennelle Human on Monday, August 1st. He reports to [place] that he would like an increase in his monthly injection of Invega Sustenna 234 mg/1.5 ml.reporting he has increased anxiety. He is scheduled this coming Monday, August 8th for his next injection.   Do you need to try and see him before next injection or I can call him?

## 2021-05-30 NOTE — Telephone Encounter (Signed)
Noted thanks °

## 2021-05-30 NOTE — Telephone Encounter (Signed)
He is now my schedule for tomorrow August 4.  I will reevaluate at that time and make a decision

## 2021-05-31 ENCOUNTER — Other Ambulatory Visit: Payer: Self-pay | Admitting: Psychiatry

## 2021-05-31 ENCOUNTER — Other Ambulatory Visit: Payer: Self-pay

## 2021-05-31 ENCOUNTER — Ambulatory Visit (INDEPENDENT_AMBULATORY_CARE_PROVIDER_SITE_OTHER): Payer: BC Managed Care – PPO | Admitting: Psychiatry

## 2021-05-31 ENCOUNTER — Encounter: Payer: Self-pay | Admitting: Psychiatry

## 2021-05-31 DIAGNOSIS — F259 Schizoaffective disorder, unspecified: Secondary | ICD-10-CM

## 2021-05-31 DIAGNOSIS — F515 Nightmare disorder: Secondary | ICD-10-CM | POA: Diagnosis not present

## 2021-05-31 MED ORDER — HYDROXYZINE PAMOATE 50 MG PO CAPS: 50.0000 mg | ORAL_CAPSULE | Freq: Three times a day (TID) | ORAL | 0 refills | Status: DC | PRN

## 2021-05-31 MED ORDER — RISPERIDONE 2 MG PO TABS: 2.0000 mg | ORAL_TABLET | Freq: Every day | ORAL | 0 refills | Status: DC

## 2021-05-31 MED ORDER — INVEGA SUSTENNA 234 MG/1.5ML IM SUSY: PREFILLED_SYRINGE | INTRAMUSCULAR | 1 refills | Status: DC

## 2021-05-31 MED ORDER — DIVALPROEX SODIUM ER 500 MG PO TB24
ORAL_TABLET | ORAL | 1 refills | Status: DC
Start: 2021-05-31 — End: 2022-01-16

## 2021-05-31 NOTE — Patient Instructions (Addendum)
Add risperidone 1/2 tablet nightly for 1-2 weeks and if no improvement in paranoia and anxiety then increase to 1 nightly  try hydroxyzine instead of trazodone for sleep to see if dreams are better.

## 2021-05-31 NOTE — Progress Notes (Signed)
Scott Vargas Vargas [id number] November 26, 1995 25 y.o.  Subjective:   Patient ID:  Scott Vargas is a 25 y.o. (DOB November 26, 1995) male.  Chief Complaint:  Chief Complaint  Patient presents with   Follow-up   Schizoaffective disorder, unspecified type (HCC)    HPI Scott Vargas presents to the office today for follow-up of schizoaffective disorder with recent hospitalizations. First seen in our office 12/29/2020.  His symptoms were improved but not resolved and included depression, paranoia, intrusive violent images, chronic suicidal thoughts. He was just out of the hospital so no medications were in changed which included Invega IM every 4 weeks, risperidone 2 mg nightly, prazosin 2 mg nightly, trazodone 100 mg nightly, hydroxyzine 50 mg 3 times daily as needed  01/12/2021 appointment with the following noted: Working and going pretty smooth.  E[place] HowardHung out with friends and it went better than I thought. 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   No pot use since here. Mood pretty chill. Still gets voices negative and positive like "people don't like you".  Voices come and go.  Dealt with it a long time but never told anyone.  Better if busy.   Not sure about character of voices.  They come to me.  Worse when not busy and any time of day.  Voices are frustrating.  Voices got worse July 2020 in sprittual experience.  Not depressed.  Pleased friends accepted him better than expected. [facility] which is more centered spiriitually.   Still seeing therapist. Some people bother him but not sure why.  Trying to make new friends. [place] got 2 restraining orders from 2 different churches.  No outstanding charges. Alcohol 2 -3 per week. Started Depakote without problems.  Pretty well with anger. Sleep OK with mixed type.  NM awaken him once weekly. No SE.  Then with parents [name] & dad Not as depressed as he was. M & F notes increase anxiety as got closer to injection date.   F sees it's "night and day"  Difference and I have my son back. M agrees he's exhibited bravery in doing difficult things. [facility] [place]  Plan: Get next [name]  03/07/2021 appointment with the following noted:  Alone and with Mother Last received Invega Sustenna 234 mg per 1.5 mL injection on 02/26/2021.  Last 2 injections have it 3-week intervals instead of typical 4-week intervals bc of ongoing psychotic sx. Doing well and exercising and biking.  Doing ok with voices.  Still hear them somewhat but less prominent and ignoring them more.  Mood pretty calm.  Not lately much fear or anxiety.  Maybe a little better with small talk with people. 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 No problems with meds.  Sometimes lower motivation.    Less hydroxyzine 1/week.   Dreams good and bad still.  Rarely will be awakened by them. No anger outbursts. Mild paranoia out in public about what people are thinking. With Mom:  Superb cp to 2 mos ago.  Some days lacks motivation.  Better on days he works. Plan: Trial L-carnitine 1000 mg 1 twice daily to see if energy and focus are better. Change Invega Sustaina back to every 4 weeks at 234mg  q weeks   05/31/2021 appt noted: Called a couple days ago complaining of more anxiety. Lately with job search and mostly unemployed has  a lot of free time and not feeling good about himself.  Looking for job through [employer]. Sees GF regularly in [place].  Likes the summer. When in groups or around strangers has anxiety but it is improved over the past.  Does not feel as paranoid.  But will feel people are looking at him too much.  Causes anxiety but not anger.  Makes me not want to be around people.Takes hydroxyzine with anxiety and it helps. AH "not so bad".  Dreams can be scary or disturbing.   My mind was hijacked several months ago.  Meds  leveled me out.   Taking trazodone at night for sleep.  Tolerating meds except weight gain from 170 to 210# over the year.  Hungry all the time. Risperidone has not bee taken.  Past Psychiatric Medication Trials: History Dr. Krista Blue outpatient [facility] 10/2020 Lorazepam Propranolol Lamotrigine Invega sustaina started Jan 2022 in hospital [facility] Risperidone 6 Abilify 10 NR and wt gain Lithium 1200 in 2020 level 0.8 Trazodone, hydroxyzine, prazosin lithium Depakote 1000 started 12/29/20  Review of Systems:  Review of Systems  Constitutional:  Positive for fatigue.  Cardiovascular:  Negative for chest pain and palpitations.  Neurological:  Negative for tremors and weakness.   Medications: I have reviewed the patient's current medications.  Current Outpatient Medications  Medication Sig Dispense Refill   divalproex (DEPAKOTE ER) 500 MG 24 hr tablet 2 at night 180 tablet 1   hydrOXYzine (VISTARIL) 50 MG capsule Take 1 capsule (50 mg total) by mouth 3 (three) times daily as needed. 90 capsule 0   prazosin (MINIPRESS) 2 MG capsule TAKE 1 CAPSULE (2 MG TOTAL) BY MOUTH AT BEDTIME. 90 capsule 0   propranolol (INDERAL) 10 MG tablet Take 10 mg by mouth at bedtime.     traZODone (DESYREL) 50 MG tablet TAKE 1 TABLET BY MOUTH EVERYDAY AT BEDTIME 90 tablet 0   paliperidone (INVEGA SUSTENNA) 234 MG/1.5ML SUSY injection Inject 234 mg into the muscle once for 1 dose. Every 4 weeks 1.5 mL 1   risperiDONE (RISPERDAL) 2 MG tablet Take 2 mg by mouth at bedtime. 1/2 tablet once a week prn (Patient not taking: No sig reported)     No current facility-administered medications for this visit.    Medication Side Effects: weight  Allergies: No Known Allergies  Past Medical History:  Diagnosis Date   Migraine     History reviewed. No pertinent family history.  Social History   Socioeconomic History   Marital status: Single    Spouse name: Not on file   Number of children: Not on file   Years  of education: Not on file   Highest education level: Not on file  Occupational History   Not on file  Tobacco Use   Smoking status: Light Smoker   Smokeless tobacco: Never  Substance and Sexual Activity   Alcohol use: Not on file   Drug use: Not on file   Sexual activity: Not on file  Other Topics Concern   Not on file  Social History Narrative   Not on file   Social Determinants of Health   Financial Resource Strain: Not on file  Food Insecurity: Not on file  Transportation Needs: Not on file  Physical Activity: Not on file  Stress: Not on file  Social Connections: Not on file  Intimate Partner Violence: Not on file    Past Medical History, Surgical history, Social history, and Family history were reviewed and updated as appropriate.  Fraternal twin brother 2 other brothers  Please see review of systems for further details on the patient's review from today.   Objective:   Physical Exam:  There were no vitals taken for this visit.  Physical Exam Constitutional:      General: He is not in acute distress. Musculoskeletal:        General: No deformity.  Neurological:     Mental Status: He is alert and oriented to person, place, and time.     Coordination: Coordination normal.  Psychiatric:        Attention and Perception: He is inattentive. He perceives auditory hallucinations. He does not perceive visual hallucinations.        Mood and Affect: Mood is not anxious or depressed. Affect is not labile, blunt, angry, tearful or inappropriate.        Speech: Speech normal.        Behavior: Behavior normal.        Thought Content: Thought content is paranoid. Thought content is not delusional. Thought content does not include homicidal or suicidal ideation. Thought content does not include homicidal or suicidal plan.        Cognition and Memory: Cognition and memory normal.        Judgment: Judgment normal.     Comments: Insight intact Mild paranoia gradually better  but persists Voices milder Mild inattention    Lab Review:     Component Value Date/Time   NA 138 08/25/2020 1522   K 4.1 08/25/2020 1522   CL 101 08/25/2020 1522   CO2 26 08/25/2020 1522   GLUCOSE 95 08/25/2020 1522   BUN 12 08/25/2020 1522   CREATININE 0.92 08/25/2020 1522   CALCIUM 9.1 08/25/2020 1522   PROT 7.0 08/25/2020 1522   ALBUMIN 4.2 08/25/2020 1522   AST 21 08/25/2020 1522   ALT 17 08/25/2020 1522   ALKPHOS 59 08/25/2020 1522   BILITOT 0.7 08/25/2020 1522   GFRNONAA >60 08/25/2020 1522       Component Value Date/Time   WBC 8.2 08/25/2020 1522   RBC 5.20 08/25/2020 1522   HGB 14.8 08/25/2020 1522   HCT 45.6 08/25/2020 1522   PLT 293 08/25/2020 1522   MCV 87.7 08/25/2020 1522   MCH 28.5 08/25/2020 1522   MCHC 32.5 08/25/2020 1522   RDW 12.1 08/25/2020 1522    No results found for: POCLITH, LITHIUM   No results found for: PHENYTOIN, PHENOBARB, VALPROATE, CBMZ   .res Assessment: Plan:    Amaury was seen today for follow-up and schizoaffective disorder, unspecified type (hcc).  Diagnoses and all orders for this visit:  Schizoaffective disorder, unspecified type (HCC)  Nightmare  Greater than 50% of 60 min face to face time with patient was spent on counseling and coordination of care. We extensively discussed the new diagnosis of schizoaffective disorder with the patient and his mom.  We discussed prognosis.  We discussed factors such as marijuana use which could worsen outcome.  We discussed the importance of med compliance and how that can change future outcomes with psychotic symptoms as well as relapse risk.  We discussed side effects of antipsychotic medications.  We discussed the trial and error nature of finding the optimal antipsychotic at optimal dose.    Overall his psychotic symptoms are much improved but not resolved. Improving paranoia and voices.   Normal Head CT scan.  They asked whether further neuro work-up was indicated and there does  not appear to be focal neurologic signs or  symptoms to suggest that.  Start counseling with Elio Forget when possible.  Support groups [facility], [facility], [facility].  Going to [employer] support group by Zoom a couple of times.  Faith based.  Continue meds. Except try hydroxyzine instead of trazodone for sleep to see if dreams are better.  Continue Invega Sustaina back to every 4 weeks at 234mg  q weeks DT observed wearing off.  We discussed that it can take several months for long-acting injectables to have optimal effect and reach steady state.  He is tolerating the antipsychotic well. Add risperidone 1/2 tablet nightly for 1-2 weeks and if no improvement in paranoia and anxiety then increase to 1 nightly  He is tolerating the Depakote well and is not currently having violent thoughts .  Uncertain yet if this will be needed the longer he takes the .   Trial L-carnitine 1000 mg 1 twice daily to see if energy and focus are better with minimal results and stopped.  Disc dietary issues and risk weight gain.  Discussed potential metabolic side effects associated with atypical antipsychotics, as well as potential risk for movement side effects. Advised pt to contact office if movement side effects occur.  Option metformin, he declines   Tobacco use only rarely bc DDI.  Avoid THC.    Supportive therapy for pt and family dealing with his new onset psychosis.  FU 6-8 weeks  [place], MD, DFAPA   Please see After Visit Summary for patient specific instructions.  Future Appointments  Date Time Provider Department Center  06/04/2021  2:45 PM CP-NURSE CP-CP None  06/27/2021  1:00 PM 06/29/2021, [facility] CP-CP None  07/11/2021  1:00 PM 07/13/2021, [facility] CP-CP None  07/25/2021  1:00 PM 07/27/2021, [facility] CP-CP None    No orders of the defined types were placed in this encounter.   -------------------------------

## 2021-05-31 NOTE — Telephone Encounter (Signed)
Please review pharmacy note

## 2021-06-04 ENCOUNTER — Ambulatory Visit: Payer: Self-pay

## 2021-06-04 ENCOUNTER — Other Ambulatory Visit: Payer: Self-pay

## 2021-06-04 DIAGNOSIS — F259 Schizoaffective disorder, unspecified: Secondary | ICD-10-CM

## 2021-06-05 NOTE — Progress Notes (Signed)
Nurse Note: Pt here for his Gean Birchwood 234 mg/1.5 ml injection, pt is scheduled every4 weeks/28 days per Dr. Jennelle Human last office visit. Pt received injection on Left upper gluteal area. Tolerated well. Advised to set up next apt in 28-30 days. Pt voices no complaints or issues.    EXP 09-2022 LOT [id number] EXP 10/2022

## 2021-06-06 DIAGNOSIS — L219 Seborrheic dermatitis, unspecified: Secondary | ICD-10-CM | POA: Diagnosis not present

## 2021-06-06 DIAGNOSIS — I10 Essential (primary) hypertension: Secondary | ICD-10-CM | POA: Diagnosis not present

## 2021-06-27 ENCOUNTER — Other Ambulatory Visit: Payer: Self-pay

## 2021-06-27 ENCOUNTER — Ambulatory Visit (INDEPENDENT_AMBULATORY_CARE_PROVIDER_SITE_OTHER): Payer: BC Managed Care – PPO | Admitting: Mental Health

## 2021-06-27 DIAGNOSIS — F259 Schizoaffective disorder, unspecified: Secondary | ICD-10-CM
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
## 2021-06-29 ENCOUNTER — Other Ambulatory Visit: Payer: Self-pay | Admitting: Psychiatry

## 2021-06-29 DIAGNOSIS — F259 Schizoaffective disorder, unspecified: Secondary | ICD-10-CM

## 2021-06-30 ENCOUNTER — Other Ambulatory Visit: Payer: Self-pay | Admitting: Psychiatry

## 2021-06-30 DIAGNOSIS — F515 Nightmare disorder: Secondary | ICD-10-CM

## 2021-07-03 ENCOUNTER — Ambulatory Visit: Payer: BC Managed Care – PPO

## 2021-07-11 ENCOUNTER — Ambulatory Visit (INDEPENDENT_AMBULATORY_CARE_PROVIDER_SITE_OTHER): Payer: BC Managed Care – PPO | Admitting: Mental Health

## 2021-07-11 ENCOUNTER — Ambulatory Visit: Payer: BC Managed Care – PPO

## 2021-07-11 ENCOUNTER — Other Ambulatory Visit: Payer: Self-pay

## 2021-07-11 DIAGNOSIS — F259 Schizoaffective disorder, unspecified: Secondary | ICD-10-CM

## 2021-07-11 NOTE — Progress Notes (Signed)
[facility] Psychotherapy Note  Name: Scott Vargas Date: 07/11/2021 MRN: [id number] DOB: 1996/10/28 PCP: [name], MD  Time spent: 54 minutes  Treatment:  individual therapy  Mental Status Exam:    Appearance:    Casual     Behavior:   Appropriate  Motor:   WNL  Speech/Language:    Clear and Coherent  Affect:   Full range   Mood:   Anxious, pleasant  Thought process:   normal  Thought content:     WNL  Sensory/Perceptual disturbances:     none  Orientation:   x4  Attention:   Good  Concentration:   Good  Memory:   Intact  Fund of knowledge:    Consistent with age and development  Insight:     Good  Judgment:    Good  Impulse Control:   Good     Reported Symptoms:  anxiety, depression, rumination, avoidant behavior (crowds at times)  Risk Assessment: Danger to Self:  No Self-injurious Behavior: No Danger to Others: No Duty to Warn:no Physical Aggression / Violence:No  Access to Firearms a concern: No  Gang Involvement:No  Patient / guardian was educated about steps to take if suicide or homicide risk level increases between visits: yes While future psychiatric events cannot be accurately predicted, the patient does not currently require acute inpatient psychiatric care and does not currently meet [facility] involuntary commitment criteria.   Family History:  Raised by both parents.  3 brothers- age 26, 95 and 1 (patient's feternal twin)  Living situation: the patient lives with their family  Legal History: Pending legal issue / charges: none History of legal issue / charges: none  Medical History/Surgical History: Past Medical History:  Diagnosis Date   Migraine     Diagnoses:    ICD-10-CM   1. Schizoaffective disorder, unspecified type (HCC)  F25.9       Subjective:   Patient presents on time for today's session.  He shared how he did not attend a concert as discussed last session with his friend.  He stated he decided to stay home  and watch football as this is one of his favorite pastimes.  When exploring his anxiety in social situations, which is where he stated it manifest primarily, patient stated he has had less issues recently due to his being at home often.  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  He stated that at this point, he is trying to "give myself time" before seeking full-time employment.  He identified feeling "emotionally exhausted" and we further explored this sentiment.  He stated he remains tired often throughout the day but feels his medications have been helpful.  Through guided discovery, he identified his tendency to compare himself to some same age peers, friends that have full-time jobs and have moved forward with her life in some aspects such as with college education.  He identified the need to work on being more social, trying to spend more time with friends and get out of his house more often.  He states he deals with "negative thoughts" that typically directed toward himself, about himself.  He identified that these are both based in circumstantial life issues but also are incongruent and relate to what he has been coping with over the past 1 to 2 years which relate to his diagnosis.  Assisted him in identifying calming, self supportive thoughts with which to cope between sessions during these occurrences.  Plan of Care: Patient to continue to work on identifying thoughts that make his anxiety increased in social situations and to continue to allow himself to engage in social situations to continue to try and work past his anxiety.  Patient to ground himself with rational, calming self talk when having intrusive thoughts.   Waldron Session, [facility]

## 2021-07-11 NOTE — Progress Notes (Signed)
Nurse Note:   Pt in today for his monthly Invega Sustenna 234 mg/1.5 ml injection, pt is scheduled every 4 weeks/28days per Dr. Jennelle Human. Pt received injection in right upper gluteal area. No complains voice. He reports doing pretty good. He set up his apt on the way out.  EXP 12/2022 LOT [id number]

## 2021-07-25 ENCOUNTER — Ambulatory Visit: Payer: Self-pay | Admitting: Mental Health

## 2021-08-08 ENCOUNTER — Other Ambulatory Visit: Payer: Self-pay

## 2021-08-08 ENCOUNTER — Ambulatory Visit (INDEPENDENT_AMBULATORY_CARE_PROVIDER_SITE_OTHER): Payer: BC Managed Care – PPO

## 2021-08-08 DIAGNOSIS — F259 Schizoaffective disorder, unspecified: Secondary | ICD-10-CM

## 2021-08-08 NOTE — Progress Notes (Signed)
Nurse Note:  Pt here for his monthly Invega Sustenna 234 mg/1.5 ML given in his left upper gluteal buttocks area. Pt tolerated well. No complaints voiced. Pt advised to schedule next apt on his way out for 4 weeks.   LOT #[id number] EXP 01/2023

## 2021-08-09 ENCOUNTER — Ambulatory Visit (INDEPENDENT_AMBULATORY_CARE_PROVIDER_SITE_OTHER): Payer: BC Managed Care – PPO | Admitting: Mental Health

## 2021-08-09 DIAGNOSIS — F259 Schizoaffective disorder, unspecified: Secondary | ICD-10-CM | POA: Diagnosis not present

## 2021-08-09 NOTE — Progress Notes (Signed)
[facility] Psychotherapy Note  Name: Scott Vargas Date: 08/09/2021 MRN: [id number] DOB: January 31, 1996 PCP: [name], MD  Time spent: 51 minutes  Treatment:  individual therapy  Mental Status Exam:    Appearance:    Casual     Behavior:   Appropriate  Motor:   WNL  Speech/Language:    Clear and Coherent  Affect:   Full range   Mood:   Anxious, pleasant  Thought process:   normal  Thought content:     WNL  Sensory/Perceptual disturbances:     none  Orientation:   x4  Attention:   Good  Concentration:   Good  Memory:   Intact  Fund of knowledge:    Consistent with age and development  Insight:     Good  Judgment:    Good  Impulse Control:   Good     Reported Symptoms:  anxiety, depression, rumination, avoidant behavior (crowds at times)  Risk Assessment: Danger to Self:  No Self-injurious Behavior: No Danger to Others: No Duty to Warn:no Physical Aggression / Violence:No  Access to Firearms a concern: No  Gang Involvement:No  Patient / guardian was educated about steps to take if suicide or homicide risk level increases between visits: yes While future psychiatric events cannot be accurately predicted, the patient does not currently require acute inpatient psychiatric care and does not currently meet [facility] involuntary commitment criteria.   Diagnoses:    ICD-10-CM   1. Schizoaffective disorder, unspecified type (HCC)  F25.9        Subjective:   Patient presents on time for today's session. Patient shared progress, how he applied for a job recently and is waiting to hear back. 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 He's hopeful about the job, but states that he can go back to his previous job if this job does not work out. Identify the name to return to work, feeling ready at this point. Report some anxiety at a recent sporting event which he attended with his father.  Facilitate has an inviting subsequent thoughts related or he was able to identify "I just don't want to stand out, I don't want people looking at me". Through guided discovery, he was able to rationalize this and other situations discussed, where he plans to remind himself that these thoughts are coming out of his feelings of anxiety.       Plan: Patient is to use CBT, mindfulness and coping skills to help manage decrease symptoms associated with their diagnosis.  Patient continues to embrace ways to take steps toward his independence and self acceptance.  Patient to utilize his support system during this time due to the recent passing of his friend.   Long-term goal:   Reduce overall level, frequency, and intensity of the feelings of anxiety in social situations from every occurrence causing anxiety to 1 our of 5 occurrences.       Short-term goal:  Patient to continue to work on identifying thoughts that make his anxiety increased in social situations and to continue to allow himself to engage in social situations to continue to try and work past his anxiety.  Patient to ground himself with rational, calming self talk when having intrusive thoughts.   Assessment of progress:  progressing   This record has been created using [employer].  Chart creation errors have been sought, but may not always have been located and  corrected. Such creation errors do not reflect on the standard of medical care.        Waldron Session, [facility]

## 2021-08-13 ENCOUNTER — Encounter: Payer: Self-pay | Admitting: Psychiatry

## 2021-08-13 ENCOUNTER — Other Ambulatory Visit: Payer: Self-pay

## 2021-08-13 ENCOUNTER — Ambulatory Visit (INDEPENDENT_AMBULATORY_CARE_PROVIDER_SITE_OTHER): Payer: BC Managed Care – PPO | Admitting: Psychiatry

## 2021-08-13 DIAGNOSIS — F5105 Insomnia due to other mental disorder: Secondary | ICD-10-CM

## 2021-08-13 DIAGNOSIS — F259 Schizoaffective disorder, unspecified: Secondary | ICD-10-CM

## 2021-08-13 DIAGNOSIS — F515 Nightmare disorder: Secondary | ICD-10-CM | POA: Diagnosis not present

## 2021-08-13 NOTE — Progress Notes (Signed)
Doc Mandala [id number] 11-14-1995 25 y.o.  Subjective:   Patient ID:  Scott Vargas is a 25 y.o. (DOB Jan 17, 1996) male.  Chief Complaint:  Chief Complaint  Patient presents with   Follow-up   Schizoaffective disorder   Sleeping Problem    HPI Scott Vargas presents to the office today for follow-up of schizoaffective disorder with recent hospitalizations. First seen in our office 12/29/2020.  His symptoms were improved but not resolved and included depression, paranoia, intrusive violent images, chronic suicidal thoughts. He was just out of the hospital so no medications were in changed which included Invega IM every 4 weeks, risperidone 2 mg nightly, prazosin 2 mg nightly, trazodone 100 mg nightly, hydroxyzine 50 mg 3 times daily as needed  01/12/2021 appointment with the following noted: Working and going pretty smooth.  Scott Vargas out with friends and it went better than I thought. 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   No pot use since here. Mood pretty chill. Still gets voices negative and positive like "people don't like you".  Voices come and go.  Dealt with it a long time but never told anyone.  Better if busy.   Not sure about character of voices.  They come to me.  Worse when not busy and any time of day.  Voices are frustrating.  Voices got worse July 2020 in sprittual experience.  Not depressed.  Pleased friends accepted him better than expected. Attending young adults group at [facility] which is more centered spiriitually.   Still seeing therapist. Some people bother him but not sure why.  Trying to make new friends. In [place] got 2 restraining orders from 2 different churches.  No outstanding charges. Alcohol 2 -3 per week. Started Depakote without problems.  Pretty well with anger. Sleep OK with mixed type.  NM awaken him once weekly. No SE.  Then with parents Scott Vargas & dad Not as depressed as he was. M & F notes increase anxiety as got closer to injection date.  F  sees it's "night and day"  Difference and I have my son back. M agrees he's exhibited bravery in doing difficult things. Last injection date March 12- day outpatient [facility] [place].  Plan: Get next Scott Vargas 1 week early by 1 week for a couple of mos DT observed wearing off.  03/07/2021 appointment with the following noted:  Alone and with Mother Last received Invega Sustenna 234 mg per 1.5 mL injection on 02/26/2021.  Last 2 injections have it 3-week intervals instead of typical 4-week intervals bc of ongoing psychotic sx. Doing well and exercising and biking.  Doing ok with voices.  Still hear them somewhat but less prominent and ignoring them more.  Mood pretty calm.  Not lately much fear or anxiety.  Maybe a little better with small talk with people. 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 No problems with meds.  Sometimes lower motivation.    Less hydroxyzine 1/week.   Dreams good and bad still.  Rarely will be awakened by them. No anger outbursts. Mild paranoia out in public about what people are thinking. With Mom:  Superb cp to 2 mos ago.  Some days lacks motivation.  Better on days he works. Plan: Trial L-carnitine 1000 mg 1 twice daily to see if energy and focus are better. Change Invega Sustaina back to every 4 weeks at 234mg  q weeks   05/31/2021 appt noted: Called a couple days ago complaining of more anxiety. Lately with job search and mostly unemployed  has a lot of free time and not feeling good about himself.  Looking for job through [employer]. Sees GF regularly in [place].  Likes the summer. When in groups or around strangers has anxiety but it is improved over the past.  Does not feel as paranoid.  But will feel people are looking at him too much.  Causes anxiety but not anger.  Makes me not want to be around people.Takes hydroxyzine with anxiety and it helps. AH "not so bad".  Dreams can be scary or disturbing.   My mind was hijacked several months ago.  Meds  leveled me out.   Taking trazodone at night for sleep.  Tolerating meds except weight gain from 170 to 210# over the year.  Hungry all the time. Risperidone has not bee taken. A/P: overall psychotic sx improved not resolved hydroxyzine instead of trazodone for sleep to see if dreams are better. Continue Scott Vargas back to every 4 weeks at 234mg  q weeks  Add risperidone 1/2 tablet nightly for 1-2 weeks and if no improvement in paranoia and anxiety then increase to 1 nightly  08/13/21 appt noted: alone and with mother Usually sleep good.  Rare NM. Better comfort around people. Went to 08/15/21. No paranoia.  AH less bothersome. Mainly evident intermittently.  Better than last visit. No depression. SE none except weight.   Energy still kind of low. 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 M sees a lot of improvement this year. Scott Vargas done well with counseling per pt and M.  She's concerned about his sleep about 12 hour and longer if nothing scheduled.   He says he doesn't have much to do & lacks motivation to exercise as in past.    Past Psychiatric Medication Trials: History Dr. Kennedy Bucker outpatient [facility] hosp 10/2020 Lorazepam Propranolol Lamotrigine Invega sustaina started Jan 2022 in hospital [facility] Risperidone 6 Abilify 10 NR and wt gain Lithium 1200 in 2020 level 0.8 Trazodone, hydroxyzine, prazosin lithium Depakote 1000 started 12/29/20  Review of Systems:  Review of Systems  Constitutional:  Positive for fatigue.  Cardiovascular:  Negative for chest pain and palpitations.  Neurological:  Negative for tremors, weakness and headaches.   Medications: I have reviewed the patient's current medications.  Current Outpatient Medications  Medication Sig Dispense Refill   divalproex (DEPAKOTE ER) 500 MG 24 hr tablet 2 at night 180 tablet 1   hydrOXYzine (VISTARIL) 50 MG capsule Take 1 capsule (50 mg total) by mouth 3 (three) times daily as needed. (Patient taking  differently: Take 50 mg by mouth 3 (three) times daily as needed. 1 tab at bedtime.) 90 capsule 0   paliperidone (INVEGA SUSTENNA) 234 MG/1.5ML SUSY injection Injection 1.5 ml IM Every 4 weeks 1.5 mL 1   prazosin (MINIPRESS) 2 MG capsule TAKE 1 CAPSULE BY MOUTH AT BEDTIME. 90 capsule 0   propranolol (INDERAL) 10 MG tablet Take 10 mg by mouth at bedtime.     risperiDONE (RISPERDAL) 1 MG tablet Take 1 tablet (1 mg total) by mouth 2 (two) times daily. (Patient taking differently: Take 1 mg by mouth at bedtime.) 180 tablet 0   No current facility-administered medications for this visit.    Medication Side Effects: weight  Allergies: No Known Allergies  Past Medical History:  Diagnosis Date   Migraine     History reviewed. No pertinent family history.  Social History   Socioeconomic History   Marital status: Single    Spouse name: Not on file  Number of children: Not on file   Years of education: Not on file   Highest education level: Not on file  Occupational History   Not on file  Tobacco Use   Smoking status: Light Smoker   Smokeless tobacco: Never  Substance and Sexual Activity   Alcohol use: Not on file   Drug use: Not on file   Sexual activity: Not on file  Other Topics Concern   Not on file  Social History Narrative   Not on file   Social Determinants of Health   Financial Resource Strain: Not on file  Food Insecurity: Not on file  Transportation Needs: Not on file  Physical Activity: Not on file  Stress: Not on file  Social Connections: Not on file  Intimate Partner Violence: Not on file    Past Medical History, Surgical history, Social history, and Family history were reviewed and updated as appropriate.   Fraternal twin brother 2 other brothers  Please see review of systems for further details on the patient's review from today.   Objective:   Physical Exam:  There were no vitals taken for this visit.  Physical Exam Constitutional:       General: He is not in acute distress. Musculoskeletal:        General: No deformity.  Neurological:     Mental Status: He is alert and oriented to person, place, and time.     Coordination: Coordination normal.  Psychiatric:        Attention and Perception: He is inattentive. He perceives auditory hallucinations. He does not perceive visual hallucinations.        Mood and Affect: Mood is not anxious or depressed. Affect is not labile, blunt, angry, tearful or inappropriate.        Speech: Speech normal.        Behavior: Behavior normal.        Thought Content: Thought content is not paranoid or delusional. Thought content does not include homicidal or suicidal ideation. Thought content does not include homicidal or suicidal plan.        Cognition and Memory: Cognition and memory normal.        Judgment: Judgment normal.     Comments: Insight intact Mild paranoia resolved Voices milder each visit. Mild inattention    Lab Review:     Component Value Date/Time   NA 138 08/25/2020 1522   K 4.1 08/25/2020 1522   CL 101 08/25/2020 1522   CO2 26 08/25/2020 1522   GLUCOSE 95 08/25/2020 1522   BUN 12 08/25/2020 1522   CREATININE 0.92 08/25/2020 1522   CALCIUM 9.1 08/25/2020 1522   PROT 7.0 08/25/2020 1522   ALBUMIN 4.2 08/25/2020 1522   AST 21 08/25/2020 1522   ALT 17 08/25/2020 1522   ALKPHOS 59 08/25/2020 1522   BILITOT 0.7 08/25/2020 1522   GFRNONAA >60 08/25/2020 1522       Component Value Date/Time   WBC 8.2 08/25/2020 1522   RBC 5.20 08/25/2020 1522   HGB 14.8 08/25/2020 1522   HCT 45.6 08/25/2020 1522   PLT 293 08/25/2020 1522   MCV 87.7 08/25/2020 1522   MCH 28.5 08/25/2020 1522   MCHC 32.5 08/25/2020 1522   RDW 12.1 08/25/2020 1522    No results found for: POCLITH, LITHIUM   No results found for: PHENYTOIN, PHENOBARB, VALPROATE, CBMZ   .res Assessment: Plan:    Scott Vargas was seen today for follow-up, schizoaffective disorder and sleeping problem.  Diagnoses  and  all orders for this visit:  Schizoaffective disorder, unspecified type (HCC)  Nightmare  Insomnia due to mental condition  Greater than 50% of 60 min face to face time with patient was spent on counseling and coordination of care. We extensively discussed the new diagnosis of schizoaffective disorder with the patient and his mom.  We discussed prognosis.  We discussed factors such as marijuana use which could worsen outcome.  We discussed the importance of med compliance and how that can change future outcomes with psychotic symptoms as well as relapse risk.  We discussed side effects of antipsychotic medications.  We discussed the trial and error nature of finding the optimal antipsychotic at optimal dose.    Overall his psychotic symptoms are much improved and nearly resolved. Improving paranoia and voices.   Normal Head CT scan.  They asked whether further neuro work-up was indicated and there does not appear to be focal neurologic signs or symptoms to suggest that.  Genesight testing disc in detail.  Disc Mother's concerns with his lack of motivation and energy and lack of self direction.  Start counseling with Elio Forget when possible.  Support groups [facility], Kellen, NAMI.  Going to [employer] support group by Zoom a couple of times.  Faith based.  Continue meds. Continue hydroxyzine instead of trazodone for sleep bc dreams are better.  Continue Invega Sustaina back to every 4 weeks at 234mg  q weeks DT observed wearing off.  We discussed that it can take several months for long-acting injectables to have optimal effect and reach steady state.  He is tolerating the antipsychotic well. Continue risperidone 1 mg nightly bc paranoia and voices are better.  He is tolerating the Depakote well and is not currently having violent thoughts .  Uncertain yet if this will be needed the longer he takes the .   Trial L-carnitine 1000 mg 1 twice daily to see if energy and focus are  better with minimal results and stopped.  Consider Wellbutrin, modafinil off label for motivation and energy but defer and see how job helps.  Disc dietary issues and risk weight gain.  Discussed potential metabolic side effects associated with atypical antipsychotics, as well as potential risk for movement side effects. Advised pt to contact office if movement side effects occur.  Option metformin, he declines   Tobacco use only rarely bc DDI.  Avoid THC.   Encourage exercise and stimulation.  Supportive therapy for pt and family dealing with his new onset psychosis. Encoourage NAMI for mom.  FU 8 weeks  [place], MD, DFAPA   Please see After Visit Summary for patient specific instructions.  Future Appointments  Date Time Provider Department Center  08/23/2021  2:00 PM 08/25/2021, [facility] CP-CP None  09/10/2021 10:00 AM 09/12/2021, [facility] CP-CP None  09/10/2021 11:00 AM CP-NURSE CP-CP None    No orders of the defined types were placed in this encounter.   -------------------------------

## 2021-08-21 ENCOUNTER — Other Ambulatory Visit: Payer: Self-pay | Admitting: Psychiatry

## 2021-08-21 DIAGNOSIS — F259 Schizoaffective disorder, unspecified: Secondary | ICD-10-CM

## 2021-08-22 NOTE — Telephone Encounter (Signed)
Do you normally order this?

## 2021-08-23 ENCOUNTER — Other Ambulatory Visit: Payer: Self-pay

## 2021-08-23 ENCOUNTER — Ambulatory Visit (INDEPENDENT_AMBULATORY_CARE_PROVIDER_SITE_OTHER): Payer: BC Managed Care – PPO | Admitting: Mental Health

## 2021-08-23 DIAGNOSIS — F259 Schizoaffective disorder, unspecified: Secondary | ICD-10-CM | POA: Diagnosis not present

## 2021-08-23 NOTE — Progress Notes (Signed)
[facility] Psychotherapy Note  Name: Scott Vargas Date: 08/23/2021 MRN: [id number] DOB: 1996/01/20 PCP: [name], MD  Time spent: 50 minutes  Treatment:  individual therapy  Mental Status Exam:    Appearance:    Casual     Behavior:   Appropriate  Motor:   WNL  Speech/Language:    Clear and Coherent  Affect:   Full range   Mood:   Anxious, pleasant  Thought process:   normal  Thought content:     WNL  Sensory/Perceptual disturbances:     none  Orientation:   x4  Attention:   Good  Concentration:   Good  Memory:   Intact  Fund of knowledge:    Consistent with age and development  Insight:     Good  Judgment:    Good  Impulse Control:   Good     Reported Symptoms:  anxiety, depression, rumination, avoidant behavior (crowds at times)  Risk Assessment: Danger to Self:  No Self-injurious Behavior: No Danger to Others: No Duty to Warn:no Physical Aggression / Violence:No  Access to Firearms a concern: No  Gang Involvement:No  Patient / guardian was educated about steps to take if suicide or homicide risk level increases between visits: yes While future psychiatric events cannot be accurately predicted, the patient does not currently require acute inpatient psychiatric care and does not currently meet [facility] involuntary commitment criteria.   Diagnoses:    ICD-10-CM   1. Schizoaffective disorder, unspecified type (HCC)  F25.9        Subjective:   Patient presents on time for today's session.  Patient shared progress, recent events where he stated he enjoyed spending the week with his grandparents in [place].  He continues to await hearing back from a job that he applied for about a month ago.  He denied having reached out to them to inquire about the position but was motivated to do so in session today by sending an emai  We discussed how his medications can cause some drowsiness as he questioned his feeling drowsy or often over the past few  months.  He identified the need to try and exercise, go for walks and has made a concerted effort to be more engaging with others.  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  He continues to want to obtain a job even if he does get accepted into the academic program for sonography as ways to take steps toward end his independence.   Plan: Patient is to use CBT, mindfulness and coping skills to help manage decrease symptoms associated with their diagnosis.  Patient continues to embrace ways to take steps toward his independence and self acceptance.  Patient to utilize his support system during this time due to the recent passing of his friend.   Long-term goal:   Reduce overall level, frequency, and intensity of the feelings of anxiety in social situations from every occurrence causing anxiety to 1 our of 5 occurrences.       Short-term goal:  Patient to continue to work on identifying thoughts that make his anxiety increased in social situations and to continue to allow himself to engage in social situations to continue to try and work past his anxiety.  Patient to ground himself with rational, calming self talk when having intrusive thoughts.   Assessment of progress:  progressing  This record has been created using [employer].  Chart creation errors have been sought, but may not always have been located and corrected. Such creation errors do not reflect on the standard of medical care.        Waldron Session, [facility]

## 2021-09-10 ENCOUNTER — Ambulatory Visit (INDEPENDENT_AMBULATORY_CARE_PROVIDER_SITE_OTHER): Payer: BC Managed Care – PPO | Admitting: Mental Health

## 2021-09-10 ENCOUNTER — Ambulatory Visit (INDEPENDENT_AMBULATORY_CARE_PROVIDER_SITE_OTHER): Payer: BC Managed Care – PPO

## 2021-09-10 ENCOUNTER — Other Ambulatory Visit: Payer: Self-pay

## 2021-09-10 DIAGNOSIS — F259 Schizoaffective disorder, unspecified: Secondary | ICD-10-CM | POA: Diagnosis not present

## 2021-09-10 NOTE — Progress Notes (Signed)
Nurse Note:   Pt here for his monthly injectable, Invega Sustenna 234 mg/1.5 ML given in his left upper gluteal buttocks area. Pt tolerated well. No complaints voiced. Pt is scheduled next month December 19 th. Injection can be given a week early or a week late if needed.    LOT [id number] EXP 02/2023

## 2021-09-10 NOTE — Progress Notes (Signed)
[facility] Psychotherapy Note  Name: Scott Vargas Date: 09/10/2021 MRN: [id number] DOB: [birth year] PCP: [name], MD  Time spent: 45 minutes  Treatment:  individual therapy  Mental Status Exam:    Appearance:    Casual     Behavior:   Appropriate  Motor:   WNL  Speech/Language:    Clear and Coherent  Affect:   Full range   Mood:   Anxious, pleasant  Thought process:   normal  Thought content:     WNL  Sensory/Perceptual disturbances:     none  Orientation:   x4  Attention:   Good  Concentration:   Good  Memory:   Intact  Fund of knowledge:    Consistent with age and development  Insight:     Good  Judgment:    Good  Impulse Control:   Good     Reported Symptoms:  anxiety, depression, rumination, avoidant behavior (crowds at times)  Risk Assessment: Danger to Self:  No Self-injurious Behavior: No Danger to Others: No Duty to Warn:no Physical Aggression / Violence:No  Access to Firearms a concern: No  Gang Involvement:No  Patient / guardian was educated about steps to take if suicide or homicide risk level increases between visits: yes While future psychiatric events cannot be accurately predicted, the patient does not currently require acute inpatient psychiatric care and does not currently meet [facility] involuntary commitment criteria.   Diagnoses:    ICD-10-CM   1. Schizoaffective disorder, unspecified type (HCC)  F25.9        Subjective:   Patient presents on time for today's session. Progress, recent events.  He stated that he was able to hang out with some friends about a week ago, this was the only instance where he was social with friends since our previous session.  He stated he is working to try to increase his motivation.  He stated he is taking less frequent naps, less watching TV throughout the day.  He stated that he has found it helpful to engage more in video games, as he states that he feels less sleepy throughout the day.   He stated he also has been drinking coffee to get caffeine to help with alertness.  Enjoyed recent interactions with family as they had a vacation to the beach where he enjoyed the trip.  Through motivational interviewing, he identified the need to have a girlfriend however, wants to secure a job first.  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  Plan: Patient is to use CBT, mindfulness and coping skills to help manage decrease symptoms associated with their diagnosis.  Patient continues to embrace ways to take steps toward his independence and self acceptance.  Patient to utilize his support system during this time due to the recent passing of his friend.   Long-term goal:   Reduce overall level, frequency, and intensity of the feelings of anxiety in social situations from every occurrence causing anxiety to 1 our of 5 occurrences.       Short-term goal:  Patient to continue to work on identifying thoughts that make his anxiety increased in social situations and to continue to allow himself to engage in social situations to continue to try and work past his anxiety.  Patient to ground himself with rational, calming self talk when having intrusive thoughts.   Assessment of progress:  progressing   This record has  been created using [employer].  Chart creation errors have been sought, but may not always have been located and corrected. Such creation errors do not reflect on the standard of medical care.        Waldron Session, [facility]

## 2021-10-01 ENCOUNTER — Ambulatory Visit: Payer: BC Managed Care – PPO | Admitting: Mental Health

## 2021-10-15 ENCOUNTER — Ambulatory Visit (INDEPENDENT_AMBULATORY_CARE_PROVIDER_SITE_OTHER): Payer: BC Managed Care – PPO | Admitting: Mental Health

## 2021-10-15 ENCOUNTER — Ambulatory Visit: Payer: BC Managed Care – PPO

## 2021-10-15 ENCOUNTER — Other Ambulatory Visit: Payer: Self-pay

## 2021-10-15 DIAGNOSIS — F259 Schizoaffective disorder, unspecified: Secondary | ICD-10-CM | POA: Diagnosis not present

## 2021-10-15 NOTE — Progress Notes (Signed)
[facility] Psych[facility] Note  Name: Scott Vargas Date: 10/15/2021 MRN: [id number] DOB: 01-29-1996 PCP: [name], MD  Time spent: 50 minutes  Treatment:  individual therapy  Mental Status Exam:    Appearance:    Casual     Behavior:   Appropriate  Motor:   WNL  Speech/Language:    Clear and Coherent  Affect:   Full range   Mood:   Anxious, pleasant  Thought process:   normal  Thought content:     WNL  Sensory/Perceptual disturbances:     none  Orientation:   x4  Attention:   Good  Concentration:   Good  Memory:   Intact  Fund of knowledge:    Consistent with age and development  Insight:     Good  Judgment:    Good  Impulse Control:   Good     Reported Symptoms:  anxiety, depression, rumination, avoidant behavior (crowds at times)  Risk Assessment: Danger to Self:  No Self-injurious Behavior: No Danger to Others: No Duty to Warn:no Physical Aggression / Violence:No  Access to Firearms a concern: No  Gang Involvement:No  Patient / guardian was educated about steps to take if suicide or homicide risk level increases between visits: yes While future psychiatric events cannot be accurately predicted, the patient does not currently require acute inpatient psychiatric care and does not currently meet [facility] involuntary commitment criteria.   Diagnoses:    ICD-10-CM   1. Schizoaffective disorder, unspecified type (HCC)  F25.9       Subjective:   Patient presents on time for today's session.  He shared how he continues to not hear from the job at the local [employer] that he had applied for about 2 months ago.  He stated he was looking forward to this opportunity however, at this point has lost hope.  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  He stated that he is felt very comfortable at home, playing  video games but also the need to break this pattern as he wants and feels he needs to have employment for not only income but getting out of the house and providing more structure to his day.  He has made some efforts to talk to his friends, has not left the house as often recently, is talking to a girl with whom he has some interest but admits he has not gone out of his way to spend time with her either.  His primary focus is finding employment and plans to follow through.    Plan: Patient is to use CBT, mindfulness and coping skills to help manage decrease symptoms associated with their diagnosis.  Patient continues to embrace ways to take steps toward his independence and self acceptance.  Patient to utilize his support system during this time due to the recent passing of his friend.   Long-term goal:   Reduce overall level, frequency, and intensity of the feelings of anxiety in social situations from every occurrence causing anxiety to 1 our of 5 occurrences.       Short-term goal:  Patient to continue to work on identifying thoughts that make his anxiety increased in social situations and to continue to allow himself to engage in social situations to continue to try and work past his anxiety.  Patient to ground himself with rational, calming self talk when  having intrusive thoughts.   Assessment of progress:  progressing   This record has been created using [employer].  Chart creation errors have been sought, but may not always have been located and corrected. Such creation errors do not reflect on the standard of medical care.        Waldron Session, [facility]

## 2021-10-15 NOTE — Progress Notes (Signed)
Nurse Note:   Pt here for his monthly injectable, Invega Sustenna 234 mg/1.5 ML given in his left upper gluteal buttocks area. Pt tolerated well. No complaints voiced. Pt is scheduled next month January 24 th. Injection can be given a week early or a week late if needed.      LOT [id number] EXP 04/2023

## 2021-10-18 ENCOUNTER — Other Ambulatory Visit: Payer: Self-pay

## 2021-10-18 ENCOUNTER — Ambulatory Visit (INDEPENDENT_AMBULATORY_CARE_PROVIDER_SITE_OTHER): Payer: BC Managed Care – PPO | Admitting: Psychiatry

## 2021-10-18 ENCOUNTER — Encounter: Payer: Self-pay | Admitting: Psychiatry

## 2021-10-18 DIAGNOSIS — F5105 Insomnia due to other mental disorder: Secondary | ICD-10-CM

## 2021-10-18 DIAGNOSIS — F515 Nightmare disorder: Secondary | ICD-10-CM | POA: Diagnosis not present

## 2021-10-18 DIAGNOSIS — F259 Schizoaffective disorder, unspecified: Secondary | ICD-10-CM | POA: Diagnosis not present

## 2021-10-18 MED ORDER — HYDROXYZINE HCL 25 MG PO TABS: ORAL_TABLET | ORAL | 2 refills | Status: DC

## 2021-10-18 NOTE — Progress Notes (Signed)
Scott Vargas [id number] Aug 14, 1996 25 y.o.  Subjective:   Patient ID:  Scott Vargas is a 25 y.o. (DOB 04/26/1996) male.  Chief Complaint:  Chief Complaint  Patient presents with   Follow-up    Schizoaffective disorder, unspecified type ([place])    HPI Scott Vargas presents to the office today for follow-up of schizoaffective disorder with recent hospitalizations. First seen in our office 12/29/2020.  His symptoms were improved but not resolved and included depression, paranoia, intrusive violent images, chronic suicidal thoughts. He was just out of the hospital so no medications were in changed which included Invega IM every 4 weeks, risperidone 2 mg nightly, prazosin 2 mg nightly, trazodone 100 mg nightly, hydroxyzine 50 mg 3 times daily as needed  01/12/2021 appointment with the following noted: Working and going pretty smooth.  Scott Vargas out with friends and it went better than I thought. 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   No pot use since here. Mood pretty chill. Still gets voices negative and positive like "people don't like you".  Voices come and go.  Dealt with it a long time but never told anyone.  Better if busy.   Not sure about character of voices.  They come to me.  Worse when not busy and any time of day.  Voices are frustrating.  Voices got worse July 2020 in sprittual experience.  Not depressed.  Pleased friends accepted him better than expected. Attending young adults group at [facility] which is more centered spiriitually.   Still seeing therapist. Some people bother him but not sure why.  Trying to make new friends. In [place] got 2 restraining orders from 2 different churches.  No outstanding charges. Alcohol 2 -3 per week. Started Depakote without problems.  Pretty well with anger. Sleep OK with mixed type.  NM awaken him once weekly. No SE.  Then with parents Scott Vargas & dad Not as depressed as he was. M & F notes increase anxiety as got closer to injection date.   F sees it's "night and day"  Difference and I have my son back. M agrees he's exhibited bravery in doing difficult things. Last injection date March 12- day outpatient [place], [place].  Plan: Get next [place] 1 week early by 1 week for a couple of mos DT observed wearing off.  03/07/2021 appointment with the following noted:  Alone and with Mother Last received Invega Sustenna 234 mg per 1.5 mL injection on 02/26/2021.  Last 2 injections have it 3-week intervals instead of typical 4-week intervals bc of ongoing psychotic sx. Doing well and exercising and biking.  Doing ok with voices.  Still hear them somewhat but less prominent and ignoring them more.  Mood pretty calm.  Not lately much fear or anxiety.  Maybe a little better with small talk with people. 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 No problems with meds.  Sometimes lower motivation.    Less hydroxyzine 1/week.   Dreams good and bad still.  Rarely will be awakened by them. No anger outbursts. Mild paranoia out in public about what people are thinking. With Mom:  Superb cp to 2 mos ago.  Some days lacks motivation.  Better on days he works. Plan: Trial L-carnitine 1000 mg 1 twice daily to see if energy and focus are better. Change Invega Sustaina back to every 4 weeks at 234mg  q weeks   05/31/2021 appt noted: Called a couple days ago complaining of more anxiety. Lately with job search and mostly unemployed  has a lot of free time and not feeling good about himself.  Looking for job through [employer]. Sees GF regularly in [place].  Likes the summer. When in groups or around strangers has anxiety but it is improved over the past.  Does not feel as paranoid.  But will feel people are looking at him too much.  Causes anxiety but not anger.  Makes me not want to be around people.Takes hydroxyzine with anxiety and it helps. AH "not so bad".  Dreams can be scary or disturbing.   My mind was hijacked several months ago.  Meds  leveled me out.   Taking trazodone at night for sleep.  Tolerating meds except weight gain from 170 to 210# over the year.  Hungry all the time. Risperidone has not bee taken. A/P: overall psychotic sx improved not resolved hydroxyzine instead of trazodone for sleep to see if dreams are better. Continue [name] back to every 4 weeks at 234mg  q weeks  Add risperidone 1/2 tablet nightly for 1-2 weeks and if no improvement in paranoia and anxiety then increase to 1 nightly  08/13/21 appt noted: alone and with mother Usually sleep good.  Rare NM. Better comfort around people. Went to 08/15/21. No paranoia.  AH less bothersome. Mainly evident intermittently.  Better than last visit. No depression. SE none except weight.   Energy still kind of low. 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 M sees a lot of improvement this year. [name] done well with counseling per pt and M.  She's concerned about his sleep about 12 hour and longer if nothing scheduled.   He says he doesn't have much to do & lacks motivation to exercise as in past.   Plan:  Continue hydroxyzine instead of trazodone for sleep bc dreams are better. Continue Invega Sustaina back to every 4 weeks at 234mg  q weeks DT observed wearing off.   Continue risperidone 1 mg nightly bc paranoia and voices are better.  10/18/21 appt noted: alone and with father Stopped all tablets including Depakote, risperidone bc didn't think he needed them. Thinks he needs hydroxyzine 50 hs for sleep. No violent thoughts off Depakote.   AH about the same or better off risperidone. Se with them he thinks he felt a little question.  Energy is better right now.  Not needing to nap in afternoon.  Still dreams a lot.  NM once weekly.  Rather not take the prazosin right now..   A little social avoidance ongoing but when he does seems ok. Disappointed he didn't get job at .    Not currently working anywhere. No problems with shot except  sore for a wek after the shot.   Is video gaming again some now and wasn't for awhile.  Including complex games. F says invega has been a miracle drug.  Past Psychiatric Medication Trials: History Dr. 10/20/21 outpatient [facility] hosp 10/2020 Lorazepam Propranolol Lamotrigine Invega sustaina started Jan 2022 in hospital [facility] Risperidone 6 Abilify 10 NR and wt gain Lithium 1200 in 2020 level 0.8 Trazodone, hydroxyzine, prazosin lithium Depakote 1000 started 12/29/20  Review of Systems:  Review of Systems  Constitutional:  Negative for fatigue.  Cardiovascular:  Negative for chest pain and palpitations.  Neurological:  Negative for tremors, weakness and headaches.   Medications: I have reviewed the patient's current medications.  Current Outpatient Medications  Medication Sig Dispense Refill   hydrOXYzine (VISTARIL) 50 MG capsule Take 1 capsule (50 mg total) by mouth 3 (three)  times daily as needed. (Patient taking differently: Take 50 mg by mouth 3 (three) times daily as needed. 1 tab at bedtime.) 90 capsule 0   paliperidone (INVEGA SUSTENNA) 234 MG/1.5ML SUSY injection INJECT 1.5ML INTRAMUSCULARLY EVERY 4 WEEKS 1.5 mL 3   divalproex (DEPAKOTE ER) 500 MG 24 hr tablet 2 at night (Patient not taking: Reported on 10/18/2021) 180 tablet 1   prazosin (MINIPRESS) 2 MG capsule TAKE 1 CAPSULE BY MOUTH AT BEDTIME. (Patient not taking: Reported on 10/18/2021) 90 capsule 0   propranolol (INDERAL) 10 MG tablet Take 10 mg by mouth at bedtime. (Patient not taking: Reported on 10/18/2021)     risperiDONE (RISPERDAL) 1 MG tablet Take 1 tablet (1 mg total) by mouth 2 (two) times daily. (Patient not taking: Reported on 10/18/2021) 180 tablet 0   No current facility-administered medications for this visit.    Medication Side Effects: weight  Allergies: No Known Allergies  Past Medical History:  Diagnosis Date   Migraine     History reviewed. No pertinent family history.  Social History    Socioeconomic History   Marital status: Single    Spouse name: Not on file   Number of children: Not on file   Years of education: Not on file   Highest education level: Not on file  Occupational History   Not on file  Tobacco Use   Smoking status: Light Smoker   Smokeless tobacco: Never  Substance and Sexual Activity   Alcohol use: Not on file   Drug use: Not on file   Sexual activity: Not on file  Other Topics Concern   Not on file  Social History Narrative   Not on file   Social Determinants of Health   Financial Resource Strain: Not on file  Food Insecurity: Not on file  Transportation Needs: Not on file  Physical Activity: Not on file  Stress: Not on file  Social Connections: Not on file  Intimate Partner Violence: Not on file    Past Medical History, Surgical history, Social history, and Family history were reviewed and updated as appropriate.   Fraternal twin brother 2 other brothers  Please see review of systems for further details on the patient's review from today.   Objective:   Physical Exam:  There were no vitals taken for this visit.  Physical Exam Constitutional:      General: He is not in acute distress. Musculoskeletal:        General: No deformity.  Neurological:     Mental Status: He is alert and oriented to person, place, and time.     Coordination: Coordination normal.  Psychiatric:        Attention and Perception: He is attentive. He perceives auditory hallucinations. He does not perceive visual hallucinations.        Mood and Affect: Mood is not anxious or depressed. Affect is not labile, blunt, angry or inappropriate.        Speech: Speech normal.        Behavior: Behavior normal.        Thought Content: Thought content is not paranoid or delusional. Thought content does not include homicidal or suicidal ideation. Thought content does not include suicidal plan.        Cognition and Memory: Cognition and memory normal.         Judgment: Judgment normal.     Comments: Insight intact Mild paranoia resolved Voices milder each visit. Mild inattention mostly    Lab Review:  Component Value Date/Time   NA 138 08/25/2020 1522   K 4.1 08/25/2020 1522   CL 101 08/25/2020 1522   CO2 26 08/25/2020 1522   GLUCOSE 95 08/25/2020 1522   BUN 12 08/25/2020 1522   CREATININE 0.92 08/25/2020 1522   CALCIUM 9.1 08/25/2020 1522   PROT 7.0 08/25/2020 1522   ALBUMIN 4.2 08/25/2020 1522   AST 21 08/25/2020 1522   ALT 17 08/25/2020 1522   ALKPHOS 59 08/25/2020 1522   BILITOT 0.7 08/25/2020 1522   GFRNONAA >60 08/25/2020 1522       Component Value Date/Time   WBC 8.2 08/25/2020 1522   RBC 5.20 08/25/2020 1522   HGB 14.8 08/25/2020 1522   HCT 45.6 08/25/2020 1522   PLT 293 08/25/2020 1522   MCV 87.7 08/25/2020 1522   MCH 28.5 08/25/2020 1522   MCHC 32.5 08/25/2020 1522   RDW 12.1 08/25/2020 1522    No results found for: POCLITH, LITHIUM   No results found for: PHENYTOIN, PHENOBARB, VALPROATE, CBMZ   .res Assessment: Plan:    Treshawn was seen today for follow-up.  Diagnoses and all orders for this visit:  Schizoaffective disorder, unspecified type ([place])  Nightmare  Insomnia due to mental condition   Greater than 50% of 60 min face to face time with patient was spent on counseling and coordination of care. We extensively discussed the new diagnosis of schizoaffective disorder with the patient and his mom.  We discussed prognosis.  We discussed factors such as marijuana use which could worsen outcome.  We discussed the importance of med compliance and how that can change future outcomes with psychotic symptoms as well as relapse risk.  We discussed side effects of antipsychotic medications.  We discussed the trial and error nature of finding the optimal antipsychotic at optimal dose.    Overall his psychotic symptoms are much improved and nearly resolved. Improving paranoia and voices.   Normal Head CT  scan.  They asked whether further neuro work-up was indicated and there does not appear to be focal neurologic signs or symptoms to suggest that.  Genesight testing disc in detail.  Disc Mother's concerns with his lack of motivation and energy and lack of self direction.  Start counseling with Lanetta Inch when possible.  Support groups [facility], [facility], [place].  Going to [employer] support group by Zoom a couple of times.  Faith based.  Continue meds. Continue hydroxyzine instead of trazodone for sleep bc dreams are better.  Continue Invega Sustaina back to every 4 weeks at 234mg  q weeks DT observed wearing off.  We discussed that it can take several months for long-acting injectables to have optimal effect and reach steady state.  He is tolerating the antipsychotic well. Continue risperidone 1 mg nightly bc paranoia and voices are better.  He is tolerating the Depakote well and is not currently having violent thoughts .  Uncertain yet if this will be needed the longer he takes the [place].   Trial L-carnitine 1000 mg 1 twice daily to see if energy and focus are better with minimal results and stopped.  Consider Wellbutrin, modafinil off label for motivation and energy but defer and see how job helps.  Disc dietary issues and risk weight gain.  Discussed potential metabolic side effects associated with atypical antipsychotics, as well as potential risk for movement side effects. Advised pt to contact office if movement side effects occur.  Option metformin, he declines   Tobacco use only rarely bc DDI.  Avoid  THC.   Encourage exercise and stimulation.  Supportive therapy for pt and family dealing with his new onset psychosis. Encoourage NAMI for mom.  FU 8 weeks  Lynder Parents, MD, DFAPA   Please see After Visit Summary for patient specific instructions.  Future Appointments  Date Time Provider [place]  10/30/2021  1:00 PM [name], [facility] CP-CP None  11/20/2021  10:30 AM CP-NURSE CP-CP None  11/20/2021 11:00 AM [name], [facility] CP-CP None  12/04/2021 11:00 AM [name], [facility] CP-CP None    No orders of the defined types were placed in this encounter.    -------------------------------

## 2021-10-30 ENCOUNTER — Ambulatory Visit (INDEPENDENT_AMBULATORY_CARE_PROVIDER_SITE_OTHER): Payer: BC Managed Care – PPO | Admitting: Mental Health

## 2021-10-30 ENCOUNTER — Other Ambulatory Visit: Payer: Self-pay

## 2021-10-30 DIAGNOSIS — F259 Schizoaffective disorder, unspecified: Secondary | ICD-10-CM

## 2021-10-30 NOTE — Progress Notes (Signed)
[facility] Psychotherapy Note  Name: Scott Vargas Date: 10/30/2021 MRN: [id number] DOB: [birth year] PCP: [name], MD  Time spent: 50 minutes  Treatment:  individual therapy  Mental Status Exam:    Appearance:    Casual     Behavior:   Appropriate  Motor:   WNL  Speech/Language:    Clear and Coherent  Affect:   Full range   Mood:   Anxious, pleasant  Thought process:   normal  Thought content:     WNL  Sensory/Perceptual disturbances:     none  Orientation:   x4  Attention:   Good  Concentration:   Good  Memory:   Intact  Fund of knowledge:    Consistent with age and development  Insight:     Good  Judgment:    Good  Impulse Control:   Good     Reported Symptoms:  anxiety, depression, rumination, avoidant behavior (crowds at times)  Risk Assessment: Danger to Self:  No Self-injurious Behavior: No Danger to Others: No Duty to Warn:no Physical Aggression / Violence:No  Access to Firearms a concern: No  Gang Involvement:No  Patient / guardian was educated about steps to take if suicide or homicide risk level increases between visits: yes While future psychiatric events cannot be accurately predicted, the patient does not currently require acute inpatient psychiatric care and does not currently meet [facility] involuntary commitment criteria.   Diagnoses:    ICD-10-CM   1. Schizoaffective disorder, unspecified type (HCC)  F25.9        Subjective:   Patient presents on time for today's session.  He shared recent events in progress, stated that he had a pleasant Christmas holiday with family.  He reports needing to find a job, stated that he plans to look for some more aggressively in the coming week.  He stated he also plans to talk to his job coach through vocational rehab.  He admitted that some of his motivation struggle is due to his not working, sees the value in having a more structured schedule which is provided through employment, also ready  to generate some income.  He is not taking recent steps to be social with friends, plans to possibly talk with one of his friends and possibly visit him in the coming weeks.  Some interest in a young lady however, stated they are more friends currently at this point.  Discusses taking steps to organize his week, to set small but achievable goals to take steps toward finding employment.  He plans to email his job coach today and potentially apply for 1 or 2 positions he is on recently.   Plan: Patient is to use CBT, mindfulness and coping skills to help manage decrease symptoms associated with their diagnosis.  Patient continues to embrace ways to take steps toward his independence and self acceptance.  Patient to utilize his support system during this time due to the recent passing of his friend.   Long-term goal:   Reduce overall level, frequency, and intensity of the feelings of anxiety in social situations from every occurrence causing anxiety to 1 our of 5 occurrences.       Short-term goal:  Patient to continue to work on identifying thoughts that make his anxiety increased in social situations and to continue to allow himself to engage in social situations to continue to try and work past his anxiety.  Patient to ground himself with rational, calming self talk when having intrusive thoughts.   Assessment  of progress:  progressing   This record has been created using [employer].  Chart creation errors have been sought, but may not always have been located and corrected. Such creation errors do not reflect on the standard of medical care.        Waldron Session, [facility]

## 2021-11-01 ENCOUNTER — Other Ambulatory Visit: Payer: Self-pay | Admitting: Psychiatry

## 2021-11-01 DIAGNOSIS — F5105 Insomnia due to other mental disorder: Secondary | ICD-10-CM

## 2021-11-20 ENCOUNTER — Other Ambulatory Visit: Payer: Self-pay

## 2021-11-20 ENCOUNTER — Ambulatory Visit (INDEPENDENT_AMBULATORY_CARE_PROVIDER_SITE_OTHER): Payer: BC Managed Care – PPO | Admitting: Mental Health

## 2021-11-20 ENCOUNTER — Ambulatory Visit (INDEPENDENT_AMBULATORY_CARE_PROVIDER_SITE_OTHER): Payer: BC Managed Care – PPO

## 2021-11-20 DIAGNOSIS — F259 Schizoaffective disorder, unspecified: Secondary | ICD-10-CM | POA: Diagnosis not present

## 2021-11-20 NOTE — Progress Notes (Signed)
Nurse Note:   Pt here for his monthly injectable, Invega Sustenna 234 mg/1.5 ML given in his left upper gluteal buttocks area. Pt tolerated well. No complaints voiced. Pt will schedule his next injection in 28 days.      LOT [id number] EXP 04/2023

## 2021-11-20 NOTE — Progress Notes (Signed)
[facility] Psychotherapy Note  Name: Scott Vargas Date: 11/20/2021 MRN: [id number] DOB: [birth year] PCP: Shirline Frees, MD  Time spent: 50 minutes  Treatment:  individual therapy  Mental Status Exam:    Appearance:    Casual     Behavior:   Appropriate  Motor:   WNL  Speech/Language:    Clear and Coherent  Affect:   Full range   Mood:   Anxious, pleasant  Thought process:   normal  Thought content:     WNL  Sensory/Perceptual disturbances:     none  Orientation:   x4  Attention:   Good  Concentration:   Good  Memory:   Intact  Fund of knowledge:    Consistent with age and development  Insight:     Good  Judgment:    Good  Impulse Control:   Good     Reported Symptoms:  anxiety, depression, rumination, avoidant behavior (crowds at times)  Risk Assessment: Danger to Self:  No Self-injurious Behavior: No Danger to Others: No Duty to Warn:no Physical Aggression / Violence:No  Access to Firearms a concern: No  Gang Involvement:No  Patient / guardian was educated about steps to take if suicide or homicide risk level increases between visits: yes While future psychiatric events cannot be accurately predicted, the patient does not currently require acute inpatient psychiatric care and does not currently meet [facility] involuntary commitment criteria.   Diagnoses:    ICD-10-CM   1. Schizoaffective disorder, unspecified type ([place])  F25.9       Subjective:   Patient presents on time for today's session.  Patient shared recent progress, specifically with his looking for employment.  He stated that he looked for various jobs primarily in [place].  He stated that he has applied for a couple of jobs and also plans to look into other jobs outside of this area.  He continues to endorse needing this change in his life to not only provide him a source of income but also daily structure.  He stated that he continues to sleep about 10 hours per night and when working  in the past full-time would get about 8 hours.  He stated that he goes to bed consistently around the same time every evening but still sleeps in the later morning hours.  He continues to identified needing to be more social and get out of the house more often, however, has trouble to do so as one of his friends lives about an hour away.  He plans to call his friend and possibly set up a time where they can hang out.  He identified also wanting a relationship and has been on some dating as recently but recognizes that this takes time and is not in a hurry to start a relationship.  He expressed some motivation to try and engage in pleasurable interests such as mountain bike riding but wants to wait till the weather improves.   Plan: Patient is to use CBT, mindfulness and coping skills to help manage decrease symptoms associated with their diagnosis.  Patient continues to embrace ways to take steps toward his independence and self acceptance.  Patient to utilize his support system during this time due to the recent passing of his friend.   Long-term goal:   Reduce overall level, frequency, and intensity of the feelings of anxiety in social situations from every occurrence causing anxiety to 1 our of 5 occurrences.       Short-term goal:  Patient to continue  to work on identifying thoughts that make his anxiety increased in social situations and to continue to allow himself to engage in social situations to continue to try and work past his anxiety.  Patient to ground himself with rational, calming self talk when having intrusive thoughts.   Assessment of progress:  progressing   This record has been created using [employer].  Chart creation errors have been sought, but may not always have been located and corrected. Such creation errors do not reflect on the standard of medical care.        [name], [facility]

## 2021-12-04 ENCOUNTER — Ambulatory Visit (INDEPENDENT_AMBULATORY_CARE_PROVIDER_SITE_OTHER): Payer: BC Managed Care – PPO | Admitting: Mental Health

## 2021-12-04 ENCOUNTER — Other Ambulatory Visit: Payer: Self-pay

## 2021-12-04 DIAGNOSIS — F259 Schizoaffective disorder, unspecified: Secondary | ICD-10-CM

## 2021-12-04 NOTE — Progress Notes (Signed)
[facility] Psychotherapy Note  Name: Scott Vargas Date: 12/04/2021 MRN: [id number] DOB: 1995/11/14 PCP: Scott Frees, MD  Time spent: 45 minutes  Treatment:  individual therapy  Mental Status Exam:    Appearance:    Casual     Behavior:   Appropriate  Motor:   WNL  Speech/Language:    Clear and Coherent  Affect:   Full range   Mood:   Anxious, pleasant  Thought process:   normal  Thought content:     WNL  Sensory/Perceptual disturbances:     none  Orientation:   x4  Attention:   Good  Concentration:   Good  Memory:   Intact  Fund of knowledge:    Consistent with age and development  Insight:     Good  Judgment:    Good  Impulse Control:   Good     Reported Symptoms:  anxiety, depression, rumination, avoidant behavior (crowds at times)  Risk Assessment: Danger to Self:  No Self-injurious Behavior: No Danger to Others: No Duty to Warn:no Physical Aggression / Violence:No  Access to Firearms a concern: No  Gang Involvement:No  Patient / guardian was educated about steps to take if suicide or homicide risk level increases between visits: yes While future psychiatric events cannot be accurately predicted, the patient does not currently require acute inpatient psychiatric care and does not currently meet [facility] involuntary commitment criteria.   Diagnoses:    ICD-10-CM   1. Schizoaffective disorder, unspecified type ([place])  F25.9       Subjective:   Patient presents on time for today's session.  Assess progress, recent events.  He stated that he had some good news.  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  He shared details of the interview process and currently is awaiting a call from human resources to confirm his higher date.  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  He feels that the schedule will be helpful for him, allowing him to have a more consistent daily regimen of going to bed and waking up with a certain time as well as making money and having more life fulfillment.  He stated he has gained weight, that he has never been out of shape or weight this amount in his life.  He feels that he will also lose weight as a result of getting this job and he also plans to discuss options at his next med management appointment with Dr. Clovis Vargas about possibly reducing one of his medications which she feels plays a role in his weight issue. Patient is more optimistic currently, expresses some increased motivation and improved mood with his positive news regarding employment.   Plan: Patient is to use CBT, mindfulness and coping skills to help manage decrease symptoms associated with their diagnosis.  Patient continues to embrace ways to take steps toward his independence and self acceptance.     Long-term goal:   Reduce overall level, frequency, and intensity of the feelings of anxiety in social situations from every occurrence causing anxiety to 1 our of 5 occurrences.       Short-term goal:  Patient to continue to work on  identifying thoughts that make his anxiety increased in social situations and to continue to allow himself to engage in social situations to continue to try and work past his anxiety.   Patient to ground himself with rational, calming self talk when having intrusive thoughts. Increased feelings of self-worth by taking steps toward achieving more independence by finding and maintaining employment    Assessment of progress:  progressing   This record has been created using [employer].  Chart creation errors have been sought, but may not always have been located and corrected. Such creation errors do not reflect on the  standard of medical care.        Scott Vargas, [facility]

## 2021-12-11 DIAGNOSIS — R030 Elevated blood-pressure reading, without diagnosis of hypertension: Secondary | ICD-10-CM | POA: Diagnosis not present

## 2021-12-11 DIAGNOSIS — F5101 Primary insomnia: Secondary | ICD-10-CM | POA: Diagnosis not present

## 2021-12-18 ENCOUNTER — Other Ambulatory Visit: Payer: Self-pay

## 2021-12-18 ENCOUNTER — Ambulatory Visit (INDEPENDENT_AMBULATORY_CARE_PROVIDER_SITE_OTHER): Payer: BC Managed Care – PPO | Admitting: Mental Health

## 2021-12-18 ENCOUNTER — Ambulatory Visit: Payer: BC Managed Care – PPO

## 2021-12-18 ENCOUNTER — Ambulatory Visit (INDEPENDENT_AMBULATORY_CARE_PROVIDER_SITE_OTHER): Payer: BC Managed Care – PPO

## 2021-12-18 DIAGNOSIS — F259 Schizoaffective disorder, unspecified: Secondary | ICD-10-CM | POA: Diagnosis not present

## 2021-12-18 NOTE — Progress Notes (Signed)
[facility] Psychotherapy Note  Name: Scott Vargas Date: 12/18/2021 MRN: [id number] DOB: 01-16-1996 PCP: [name], MD  Time spent: 49 minutes  Treatment:  individual therapy  Mental Status Exam:    Appearance:    Casual     Behavior:   Appropriate  Motor:   WNL  Speech/Language:    Clear and Coherent  Affect:   Full range   Mood:   Anxious, pleasant  Thought process:   normal  Thought content:     WNL  Sensory/Perceptual disturbances:     none  Orientation:   x4  Attention:   Good  Concentration:   Good  Memory:   Intact  Fund of knowledge:    Consistent with age and development  Insight:     Good  Judgment:    Good  Impulse Control:   Good     Reported Symptoms:  anxiety, depression, rumination, avoidant behavior (crowds at times)  Risk Assessment: Danger to Self:  No Self-injurious Behavior: No Danger to Others: No Duty to Warn:no Physical Aggression / Violence:No  Access to Firearms a concern: No  Gang Involvement:No  Patient / guardian was educated about steps to take if suicide or homicide risk level increases between visits: yes While future psychiatric events cannot be accurately predicted, the patient does not currently require acute inpatient psychiatric care and does not currently meet [facility] involuntary commitment criteria.   Diagnoses:    ICD-10-CM   1. Schizoaffective disorder, unspecified type ([facility])  F25.9        Subjective:   Patient presents on time for today's session.  He stated that he is to start his new full-time job tomorrow and is excited about the opportunity.  He stated that he is trying to adjust his sleep schedule by waking up early this morning to prepare himself for the adjustment he will have to his sleep schedule.  He plans to keep a consistent sleep schedule, going to bed earlier as this job starts early in the morning.  He shared that he has some anxiety about starting tomorrow and we assisted him in  identifying some thoughts associated.  He identified some worry about if his supervisor will give him too many job duties prior to his being fully ready.  He was able to reframe some of these thoughts with some guidance and was able to remind himself that his initial interactions with his supervisor have all been positive and that he will ask questions if needed.  He continues also want to lose some weight, stated that he weighs 50 pounds more this year than he did about a year ago.  He plans to pay more attention to his diet and also feels that having this work schedule daily will assist him in making better choices and be less sedentary. He shared other recent concerns as he learned that his maternal grandfather is having health issues and may pass soon.  Patient is close to his grandfather and plans to spend what time he can with him when possible.  Plan: Patient is to use CBT, mindfulness and coping skills to help manage decrease symptoms associated with their diagnosis.  Patient continues to embrace ways to take steps toward his independence and self acceptance.     Long-term goal:   Reduce overall level, frequency, and intensity of the feelings of anxiety in social situations from every occurrence causing anxiety to 1 our of 5 occurrences.       Short-term goal:  Patient  to continue to work on identifying thoughts that make his anxiety increased in social situations and to continue to allow himself to engage in social situations to continue to try and work past his anxiety.   Patient to ground himself with rational, calming self talk when having intrusive thoughts. Increased feelings of self-worth by taking steps toward achieving more independence by finding and maintaining employment    Assessment of progress:  progressing   This record has been created using [employer].  Chart creation errors have been sought, but may not always have been located and corrected. Such creation errors do  not reflect on the standard of medical care.        Waldron Session, [facility]

## 2021-12-18 NOTE — Progress Notes (Signed)
Nurse Note:   Pt here for his monthly injectable, Invega Sustenna 234 mg/1.5 ML given in his right upper gluteal buttocks area. Pt tolerated well. No complaints voiced. 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 Pt will schedule his next injection in 28 days.     LOT [id number] EXP 08//2024

## 2021-12-25 ENCOUNTER — Ambulatory Visit: Payer: BC Managed Care – PPO | Admitting: Psychiatry

## 2021-12-31 ENCOUNTER — Ambulatory Visit: Payer: BC Managed Care – PPO | Admitting: Mental Health

## 2022-01-01 IMAGING — CT CT HEAD W/O CM
4 series · 16 of 47 positions shown, 18 images · non-contrast
Comparison: None.

CLINICAL DATA: Psychosis

EXAM:
CT HEAD WITHOUT CONTRAST
TECHNIQUE: Contiguous axial images were obtained from the base of the skull
through the vertex without intravenous contrast.

[Series 3: head without · axial · non-contrast · 0.43mm/px · z∈[+1299,+1419]mm · 7 of 33 slices shown, 9 images]
[im 5/33  brain]
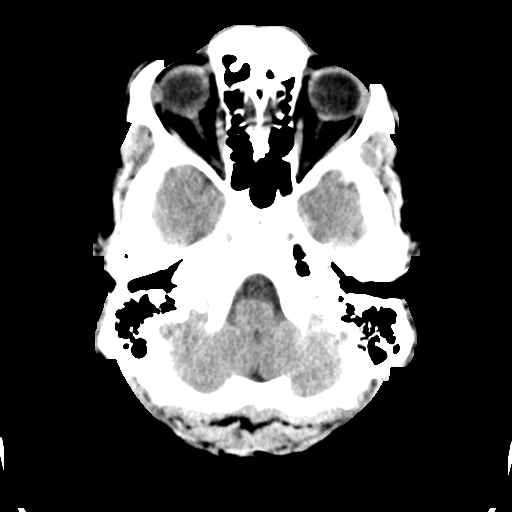
[im 5/33  bone]
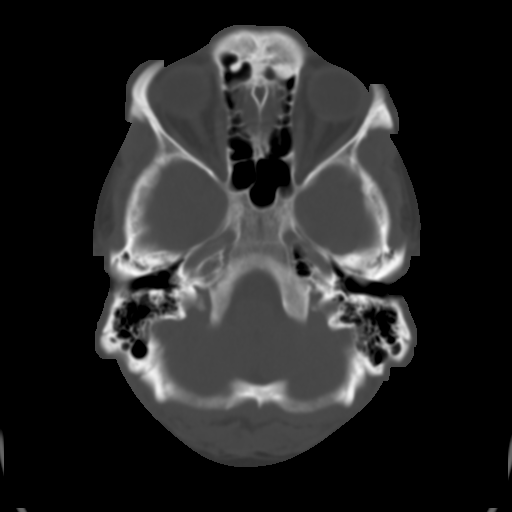
[im 9/33  brain]
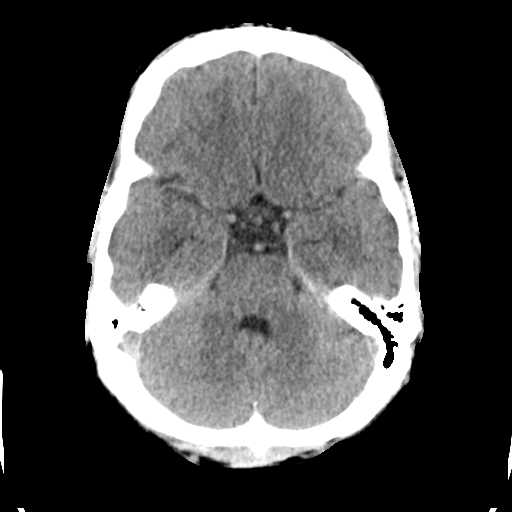
[im 13/33  brain]
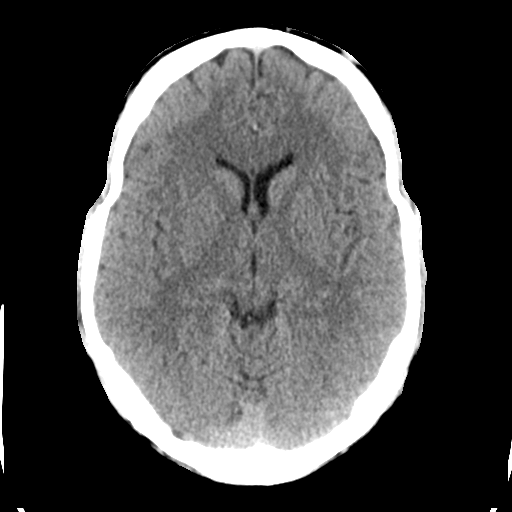
[im 17/33  brain]
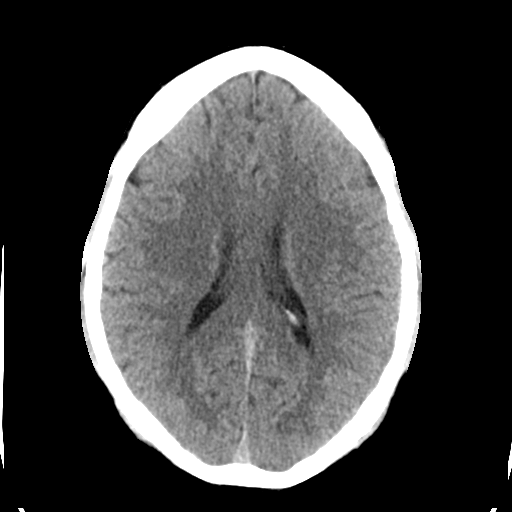
[im 21/33  brain]
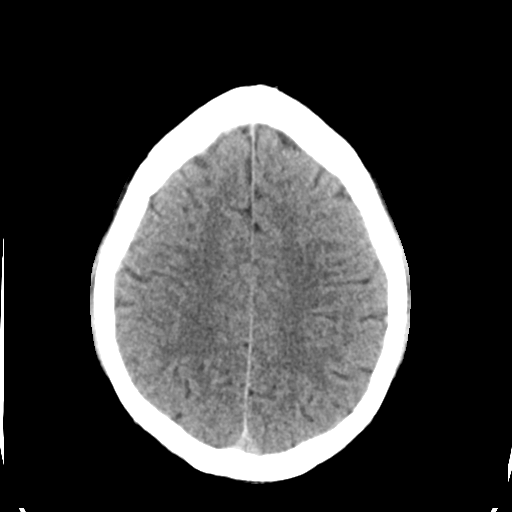
[im 21/33  bone]
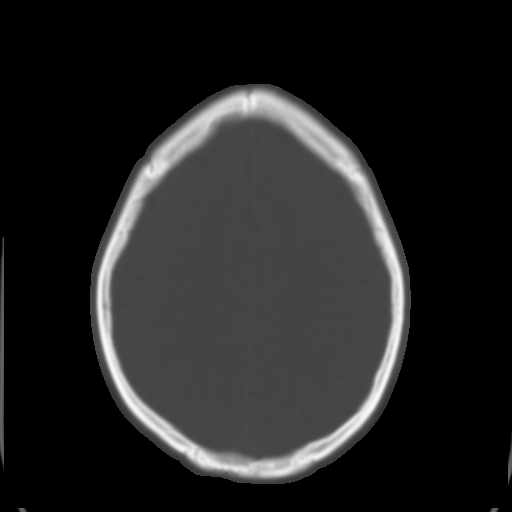
[im 25/33  brain]
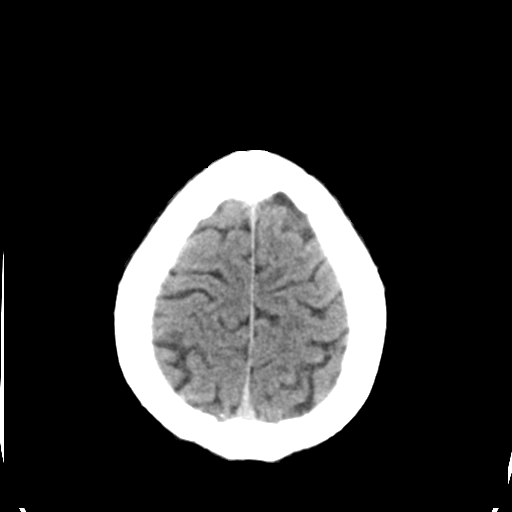
[im 29/33  brain]
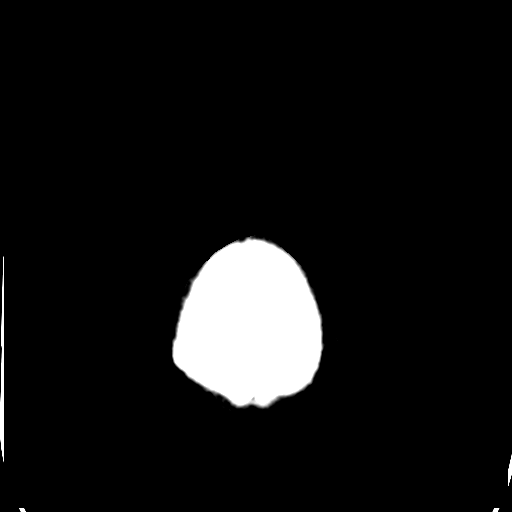

[Series 4: head bone · axial · 0.43mm/px · z∈[+1295,+1327]mm · 3 of 83 slices shown]
[im 9/83  bone]
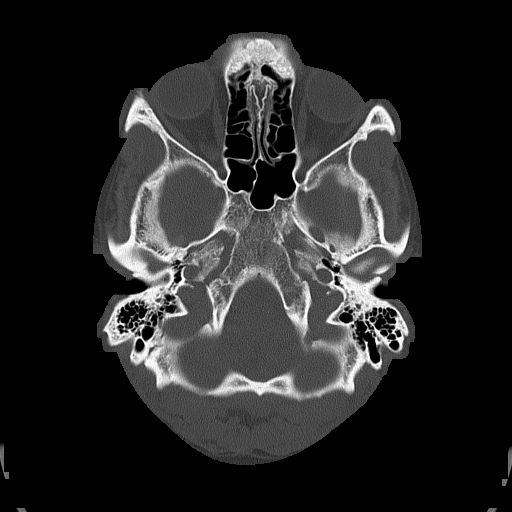
[im 17/83  bone]
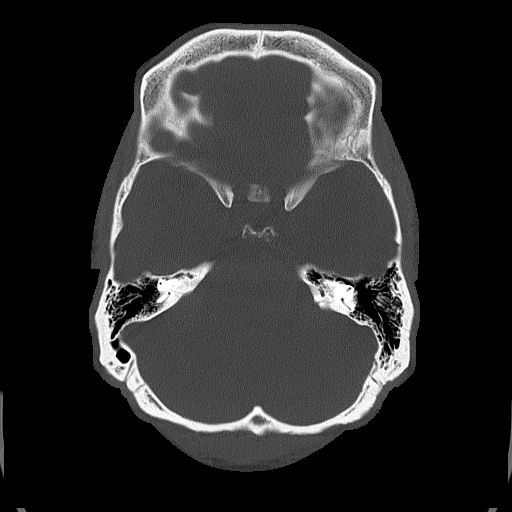
[im 25/83  bone]
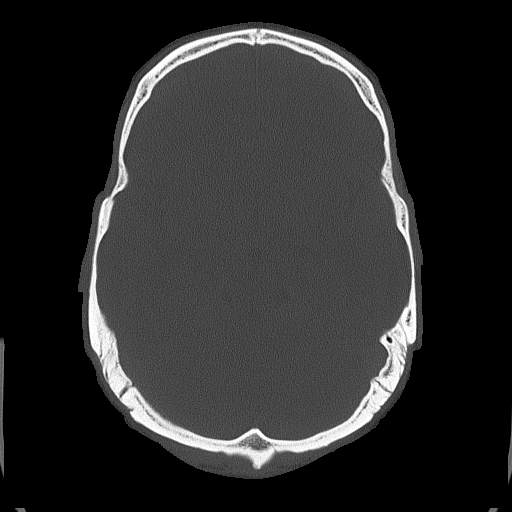

[Series 5: head without cor · coronal · non-contrast · 0.30mm/px · 3 of 74 slices shown]
[im 25/74  brain]
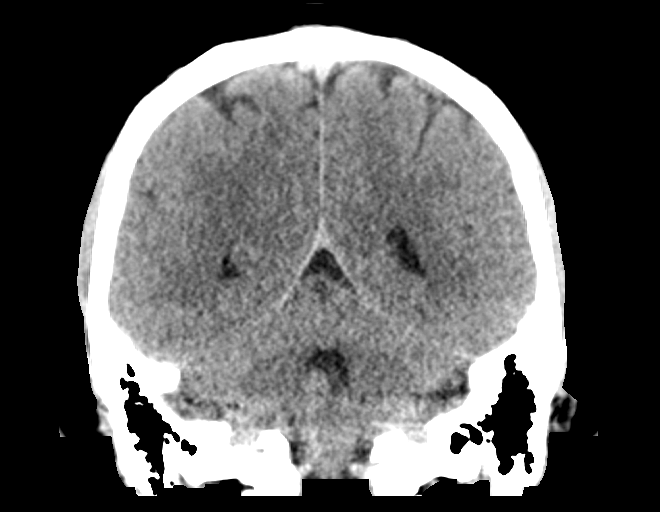
[im 33/74  brain]
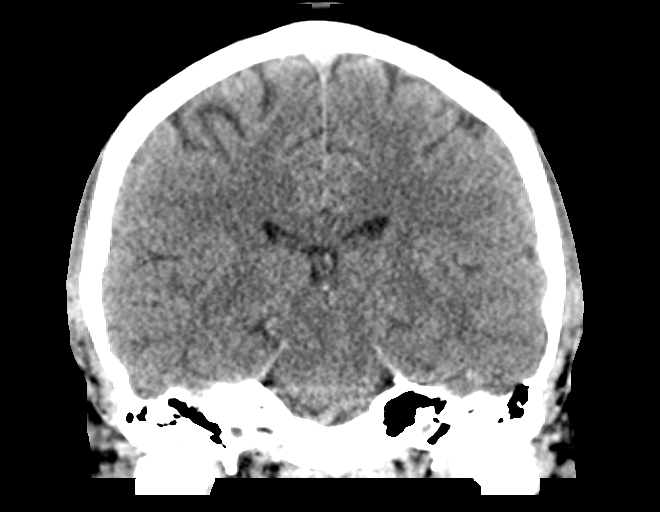
[im 41/74  brain]
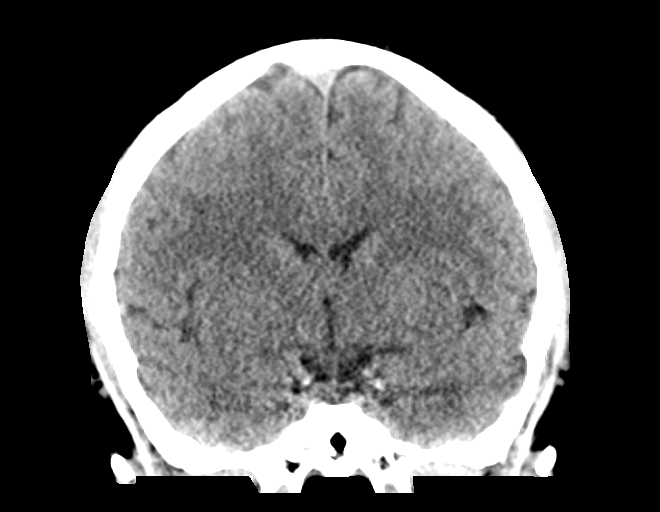

[Series 6: head without sag · sagittal · non-contrast · 0.31mm/px · 3 of 67 slices shown]
[im 23/67  brain]
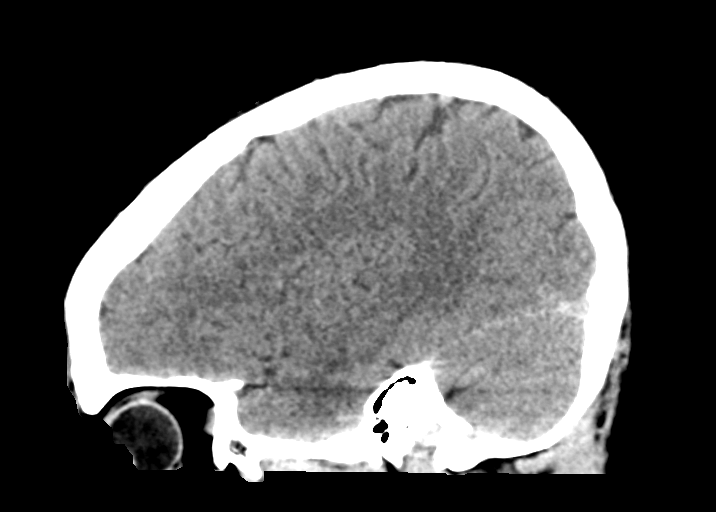
[im 34/67  brain]
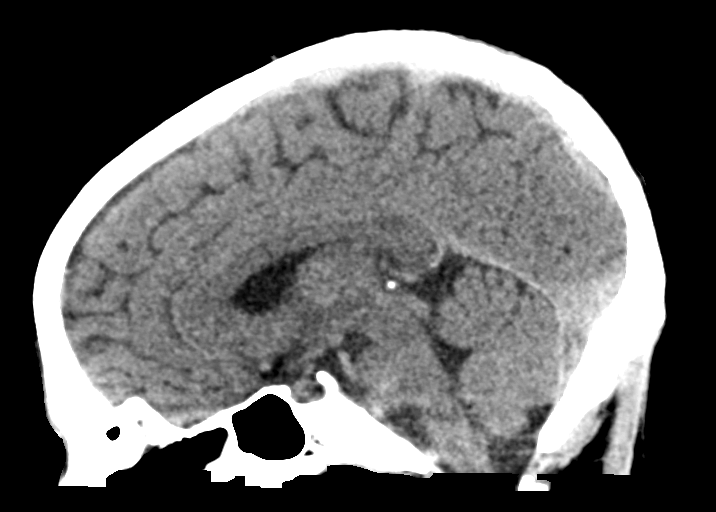
[im 45/67  brain]
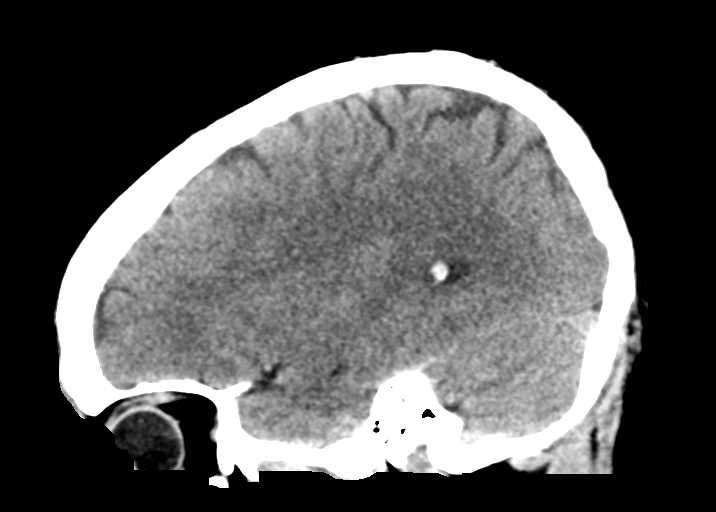

[16 of 47 positions shown; findings below may reference images not displayed]

FINDINGS: Brain: No evidence of acute infarction, hemorrhage, hydrocephalus,
extra-axial collection, visible mass lesion or mass effect.

Vascular: No hyperdense vessel or unexpected calcification.

Skull: No calvarial fracture or suspicious osseous lesion. No scalp
swelling or hematoma.

Sinuses/Orbits: Paranasal sinuses and mastoid air cells are
predominantly clear. Included orbital structures are unremarkable.

Other: None.
IMPRESSION: Normal noncontrast CT of the head.

## 2022-01-07 ENCOUNTER — Other Ambulatory Visit: Payer: Self-pay | Admitting: Psychiatry

## 2022-01-07 DIAGNOSIS — F259 Schizoaffective disorder, unspecified: Secondary | ICD-10-CM

## 2022-01-16 ENCOUNTER — Other Ambulatory Visit: Payer: Self-pay

## 2022-01-16 ENCOUNTER — Ambulatory Visit (INDEPENDENT_AMBULATORY_CARE_PROVIDER_SITE_OTHER): Payer: BC Managed Care – PPO | Admitting: Psychiatry

## 2022-01-16 ENCOUNTER — Encounter: Payer: Self-pay | Admitting: Psychiatry

## 2022-01-16 DIAGNOSIS — F259 Schizoaffective disorder, unspecified: Secondary | ICD-10-CM

## 2022-01-16 DIAGNOSIS — F411 Generalized anxiety disorder: Secondary | ICD-10-CM | POA: Diagnosis not present

## 2022-01-16 MED ORDER — PROPRANOLOL HCL 20 MG PO TABS: ORAL_TABLET | ORAL | 1 refills | Status: DC

## 2022-01-16 NOTE — Progress Notes (Signed)
Jaxzen Hunkins ?[id number] ?August 03, 1996 ?26 y.o. ? ?Subjective:  ? ?Patient ID:  Scott Vargas is a 26 y.o. (DOB 1996-05-02) male. ? ?Chief Complaint:  ?Chief Complaint  ?Patient presents with  ? Follow-up  ? Schizoaffective disorder, unspecified type   ? ? ?HPI ?Delphis Borak presents to the office today for follow-up of schizoaffective disorder with recent hospitalizations. ?First seen in our office 12/29/2020.  His symptoms were improved but not resolved and included depression, paranoia, intrusive violent images, chronic suicidal thoughts. ?He was just out of the hospital so no medications were in changed which included Invega IM every 4 weeks, risperidone 2 mg nightly, prazosin 2 mg nightly, trazodone 100 mg nightly, hydroxyzine 50 mg 3 times daily as needed ? ?01/12/2021 appointment with the following noted: ?Working and going pretty smooth.  [name] out with friends and it went better than I thought. ?Glyn Ade is physically an adjustment.   ?No pot use since here. ?Mood pretty chill. Still gets voices negative and positive like "people don't like you".  Voices come and go.  Dealt with it a long time but never told anyone.  Better if busy.   ?Not sure about character of voices.  They come to me.  Worse when not busy and any time of day.  Voices are frustrating.  Voices got worse July 2020 in sprittual experience.  ?Not depressed.  Pleased friends accepted him better than expected. ?Attending young adults group at [facility] which is more centered spiriitually.   ?Still seeing therapist. ?Some people bother him but not sure why.  ?Trying to make new friends. ?In [place] got 2 restraining orders from 2 different churches.  No outstanding charges. ?Alcohol 2 -3 per week. ?Started Depakote without problems.  Pretty well with anger. ?Sleep OK with mixed type.  NM awaken him once weekly. ?No SE. ? ?Then with parents Mariann Laster & dad ?Not as depressed as he was. ?M & F notes increase anxiety as got closer to injection date.  F  sees it's "night and day"  Difference and I have my son back. M agrees he's exhibited bravery in doing difficult things. ?Last injection date March 12- day outpatient [place], [place].  ?Plan: Get next [place] 1 week early by 1 week for a couple of mos DT observed wearing off. ? ?03/07/2021 appointment with the following noted:  Alone and with Mother ?Last received Invega Sustenna 234 mg per 1.5 mL injection on 02/26/2021.  Last 2 injections have it 3-week intervals instead of typical 4-week intervals bc of ongoing psychotic sx. ?Doing well and exercising and biking.  Doing ok with voices.  Still hear them somewhat but less prominent and ignoring them more.  Mood pretty calm.  Not lately much fear or anxiety.  Maybe a little better with small talk with people. 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 ?No problems with meds.  Sometimes lower motivation.    ?Less hydroxyzine 1/week.   ?Dreams good and bad still.  Rarely will be awakened by them. ?No anger outbursts. ?Mild paranoia out in public about what people are thinking. ?With Mom:  Superb cp to 2 mos ago.  Some days lacks motivation.  Better on days he works. ?Plan: Trial L-carnitine 1000 mg 1 twice daily to see if energy and focus are better. ?Change Invega Sustaina back to every 4 weeks at 234mg  q weeks  ? ?05/31/2021 appt noted: ?Called a couple days ago complaining of more anxiety. ?Lately with job search and mostly unemployed has  a lot of free time and not feeling good about himself.  Looking for job through [employer]. ?Sees GF regularly in [place].  Likes the summer. ?When in groups or around strangers has anxiety but it is improved over the past.  Does not feel as paranoid.  But will feel people are looking at him too much.  Causes anxiety but not anger.  Makes me not want to be around people.Takes hydroxyzine with anxiety and it helps. ?AH "not so bad".  Dreams can be scary or disturbing.   ?My mind was hijacked several months ago.  Meds  leveled me out.   ?Taking trazodone at night for sleep.  ?Tolerating meds except weight gain from 170 to 210# over the year.  Hungry all the time. ?Risperidone has not bee taken. ?A/P: overall psychotic sx improved not resolved ?hydroxyzine instead of trazodone for sleep to see if dreams are better. ?Continue Invega Sustaina back to every 4 weeks at 234mg  q weeks  ?Add risperidone 1/2 tablet nightly for 1-2 weeks and if no improvement in paranoia and anxiety then increase to 1 nightly ? ?08/13/21 appt noted: alone and with mother ?Usually sleep good.  Rare NM. ?Better comfort around people. Went to [employer]. No paranoia.  AH less bothersome. Mainly evident intermittently.  Better than last visit. ?No depression. ?SE none except weight.   ?Energy still kind of low. 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 ?M sees a lot of improvement this year. Rachel done well with counseling per pt and M.  She's concerned about his sleep about 12 hour and longer if nothing scheduled.   ?He says he doesn't have much to do & lacks motivation to exercise as in past.   ?Plan:  Continue hydroxyzine instead of trazodone for sleep bc dreams are better. ?Continue Invega Sustaina back to every 4 weeks at 234mg  q weeks DT observed wearing off.   ?Continue risperidone 1 mg nightly bc paranoia and voices are better. ? ?10/18/21 appt noted: alone and with father ?Stopped all tablets including Depakote, risperidone bc didn't think he needed them. ?Thinks he needs hydroxyzine 50 hs for sleep. ?No violent thoughts off Depakote.   ?AH about the same or better off risperidone. ?Se with them he thinks he felt a little question.  Energy is better right now.  Not needing to nap in afternoon.  Still dreams a lot.  NM once weekly.  Rather not take the prazosin right now.Marland Kitchen   ?A little social avoidance ongoing but when he does seems ok. ?Disappointed he didn't get job at [employer].    Not currently working anywhere. ?No problems with shot except  sore for a wek after the shot.   ?Is video gaming again some now and wasn't for awhile.  Including complex games. ?F says invega has been a miracle drug. ?Plan: Continue [place] back to every 4 weeks at 234mg  q weeks  ?OK to stop risperidone id not needed for voices. ? ?01/16/2022 appointment with the following noted: ?Sleep ok off hyrdroxyzine and prazosin without NM. ?Sleep 10 hours.   ?No risperidone. ?No voices but some intrusive thoughts that are not too bad. ?New job helps his negative thoughts.  Able to manage anger.  But can have thoughts of hurting others that provoke him couple times per week. ?No paranoid or fearful thoughts.  No acting ou tof anger.   ?Greater sense of purpose now that FT job.   ?No depression. ?Consistent with scheduled injection.   ? ?  Past Psychiatric Medication Trials: ?History Dr. Annell Greening outpatient ?[facility] hosp 10/2020 ?Lorazepam ?Propranolol ?Lamotrigine ?Invega sustaina started Jan 2022 in hospital [facility] ?Risperidone 6 ?Abilify 10 NR and wt gain ?Lithium 1200 in 2020 level 0.8 ?Trazodone, hydroxyzine, prazosin ?lithium ?Depakote 1000 started 12/29/20 ? ?Review of Systems:  ?Review of Systems  ?Constitutional:  Negative for fatigue.  ?Cardiovascular:  Negative for chest pain and palpitations.  ?Neurological:  Negative for tremors, weakness and headaches.  ?Psychiatric/Behavioral:  Negative for suicidal ideas.   ? ?Medications: I have reviewed the patient's current medications. ? ?Current Outpatient Medications  ?Medication Sig Dispense Refill  ? Cetirizine HCl (ZYRTEC ALLERGY PO) Take by mouth at bedtime. Unsure of dosing. Takes with Benadryl for allergies and sleep.    ? diphenhydrAMINE (BENADRYL) 25 MG tablet Take 50 mg by mouth at bedtime as needed. Taking for allergies and a sleep aid    ? hydrOXYzine (ATARAX) 25 MG tablet TAKE 1-2 TABLETS AT NIGHT FOR SLEEP (Patient taking differently: TAKE 1-2 TABLETS AT NIGHT FOR SLEEP - takes "somewhat") 180 tablet 0  ? INVEGA SUSTENNA 234  MG/1.5ML SUSY injection INJECT 1.5ML INTRAMUSCULARLY EVERY 4 WEEKS 1.5 mL 0  ? divalproex (DEPAKOTE ER) 500 MG 24 hr tablet 2 at night (Patient not taking: Reported on 10/18/2021) 180 tablet 1  ? prazosin (

## 2022-01-17 ENCOUNTER — Ambulatory Visit (INDEPENDENT_AMBULATORY_CARE_PROVIDER_SITE_OTHER): Payer: BC Managed Care – PPO

## 2022-01-17 ENCOUNTER — Ambulatory Visit (INDEPENDENT_AMBULATORY_CARE_PROVIDER_SITE_OTHER): Payer: BC Managed Care – PPO | Admitting: Mental Health

## 2022-01-17 DIAGNOSIS — F259 Schizoaffective disorder, unspecified: Secondary | ICD-10-CM

## 2022-01-17 DIAGNOSIS — F411 Generalized anxiety disorder: Secondary | ICD-10-CM

## 2022-01-17 NOTE — Progress Notes (Signed)
[facility] Psychotherapy Note ? ?Name: Scott Vargas ?Date: 01/17/2022 ?MRN: [id number] ?DOB: 02-10-1996 ?PCP: [name], MD ? ?Time spent: 50 minutes ? ?Treatment:  individual therapy ? ?Mental Status Exam: ?   ?Appearance:    Casual     ?Behavior:   Appropriate  ?Motor:   WNL  ?Speech/Language:    Clear and Coherent  ?Affect:   Full range   ?Mood:   Anxious, pleasant  ?Thought process:   normal  ?Thought content:     WNL  ?Sensory/Perceptual disturbances:     none  ?Orientation:   x4  ?Attention:   Good  ?Concentration:   Good  ?Memory:   Intact  ?Fund of knowledge:    Consistent with age and development  ?Insight:     Good  ?Judgment:    Good  ?Impulse Control:   Good  ?  ? ?Reported Symptoms:  anxiety, depression, rumination, avoidant behavior (crowds at times) ? ?Risk Assessment: ?Danger to Self:  No ?Self-injurious Behavior: No ?Danger to Others: No ?Duty to Warn:no ?Physical Aggression / Violence:No  ?Access to Firearms a concern: No  ?Gang Involvement:No  ?Patient / guardian was educated about steps to take if suicide or homicide risk level increases between visits: yes ?While future psychiatric events cannot be accurately predicted, the patient does not currently require acute inpatient psychiatric care and does not currently meet [facility] involuntary commitment criteria. ? ? ?Diagnoses:  ?  ICD-10-CM   ?1. Generalized anxiety disorder  F41.1   ?  ?2. Schizoaffective disorder, unspecified type (HCC)  F25.9   ?  ? ? ? ? ?Subjective:   ?Patient presents on time for today's session.  He shared progress, recent events with a focus on his new job experiences.  He stated that he plans to stay at his job, is working full-time and has been able to pay off his credit card.  He plans to save more money to buy a truck.  He shared experiences at work, one of his coworkers chronically complains at various times throughout the day.  Patient stated that he makes many negative comments, patient admits he  can get frustrated but is able to tolerate his behavior.  He stated that he thinks he gets along well enough with him as he went on to share how this individual will do things that indicates to patient that he does like him.  Patient stated this coworker is not just negative or critical of him but also others.  Patient stated he has anxiety in social settings, this includes his going to work.  We facilitated his identifying subsequent cognitions associated "while I fit in, will they like me" as an example.  Assisted him in working to reframe some of these cognitions identified and reviewed potential benefits of being mindful of them between sessions in an effort to better manage his anxiety. ? ? ? ?Plan: Patient is to use CBT, mindfulness and coping skills to help manage decrease symptoms associated with their diagnosis.  Patient continues to embrace ways to take steps toward his independence and self acceptance.   ?  ?Long-term goal:   ?Reduce overall level, frequency, and intensity of the feelings of anxiety in social situations from every occurrence causing anxiety to 1 our of 5 occurrences.   ?  ?  ?Short-term goal:  ?Patient to continue to work on identifying thoughts that make his anxiety increased in social situations and to continue to allow himself to engage in social situations to continue  to try and work past his anxiety.   ?Patient to ground himself with rational, calming self talk when having intrusive thoughts. ?Increased feelings of self-worth by taking steps toward achieving more independence by finding and maintaining employment ? ? ? ?Assessment of progress:  progressing ? ? ?This record has been created using [employer].  Chart creation errors have been sought, but may not always have been located and corrected. Such creation errors do not reflect on the standard of medical care.  ? ? ? ?[name], [facility]  ? ? ? ?

## 2022-01-17 NOTE — Progress Notes (Signed)
Nurse Note: ? ?Pt here for his monthly injectable, Invega Sustenna 234 mg/1.5 ml given in his left upper gluteal area. Pt tolerated well, said it did hurt some today but it would go away soon. Pt is still doing well at his new job. Pt is scheduled in 28 days for his injection. Instructed to call with any concerns or questions. ? ? ?LOT [id number] ?EXP 06/2023 ?

## 2022-02-08 ENCOUNTER — Other Ambulatory Visit: Payer: Self-pay | Admitting: Psychiatry

## 2022-02-08 DIAGNOSIS — F259 Schizoaffective disorder, unspecified: Secondary | ICD-10-CM

## 2022-02-08 DIAGNOSIS — F411 Generalized anxiety disorder: Secondary | ICD-10-CM

## 2022-02-09 ENCOUNTER — Other Ambulatory Visit: Payer: Self-pay | Admitting: Psychiatry

## 2022-02-09 DIAGNOSIS — F259 Schizoaffective disorder, unspecified: Secondary | ICD-10-CM

## 2022-02-11 ENCOUNTER — Other Ambulatory Visit: Payer: Self-pay | Admitting: Psychiatry

## 2022-02-11 DIAGNOSIS — F259 Schizoaffective disorder, unspecified: Secondary | ICD-10-CM

## 2022-02-11 DIAGNOSIS — F411 Generalized anxiety disorder: Secondary | ICD-10-CM

## 2022-02-20 ENCOUNTER — Ambulatory Visit (INDEPENDENT_AMBULATORY_CARE_PROVIDER_SITE_OTHER): Payer: BC Managed Care – PPO

## 2022-02-20 ENCOUNTER — Ambulatory Visit (INDEPENDENT_AMBULATORY_CARE_PROVIDER_SITE_OTHER): Payer: BC Managed Care – PPO | Admitting: Mental Health

## 2022-02-20 DIAGNOSIS — F259 Schizoaffective disorder, unspecified: Secondary | ICD-10-CM

## 2022-02-20 NOTE — Progress Notes (Signed)
[facility] Psychotherapy Note ? ?Name: Scott Vargas ?Date: 02/20/2022 ?MRN: [id number] ?DOB: 1996/06/21 ?PCP: Shirline Frees, MD ? ?Time spent: 54 minutes ? ?Treatment:  individual therapy ? ?Mental Status Exam: ?   ?Appearance:    Casual     ?Behavior:   Appropriate  ?Motor:   WNL  ?Speech/Language:    Clear and Coherent  ?Affect:   Full range   ?Mood:   Anxious, pleasant  ?Thought process:   normal  ?Thought content:     WNL  ?Sensory/Perceptual disturbances:     none  ?Orientation:   x4  ?Attention:   Good  ?Concentration:   Good  ?Memory:   Intact  ?Fund of knowledge:    Consistent with age and development  ?Insight:     Good  ?Judgment:    Good  ?Impulse Control:   Good  ?  ? ?Reported Symptoms:  anxiety, depression, rumination, avoidant behavior (crowds at times) ? ?Risk Assessment: ?Danger to Self:  No ?Self-injurious Behavior: No ?Danger to Others: No ?Duty to Warn:no ?Physical Aggression / Violence:No  ?Access to Firearms a concern: No  ?Gang Involvement:No  ?Patient / guardian was educated about steps to take if suicide or homicide risk level increases between visits: yes ?While future psychiatric events cannot be accurately predicted, the patient does not currently require acute inpatient psychiatric care and does not currently meet [facility] involuntary commitment criteria. ? ? ?Diagnoses:  ?  ICD-10-CM   ?1. Schizoaffective disorder, unspecified type ([place])  F25.9   ?  ? ? ? ? ? ?Subjective:   ?Patient presents on time for today's session.  Patient say that he continues to acclimate well to his new job sharing experiences. He said that one of his co-workers has had some medical issues that have impaired his ability to attend work and, although patient wants him to get well, work has been somewhat less stressful with him around due to his persistent negative comments and foul language. Patient stated that he is getting along well with this individual however and with their other coworker as  well by not taking comments to personally. He reports that he is being more social as a result of getting this job, has been making more of an effort to communicate with his friends, plans has spent some time with one of them on two separate occasions over the past month. He attributes this to his having his job and having more structure in his day as well as and increased motivation as a result. He shared some past experiences related to his religious faith as well as experiences in trying to go on dates. He stated that some of his past experiences were challenging for him to adjust to as they shared some commonality with their religious beliefs but also some notable differences. It continues to express wanting to potentially find a girlfriend at some point.  ? ? ?Interventions:   CBT, supportive therapy ? ?Plan: Patient is to use CBT, mindfulness and coping skills to help manage decrease symptoms associated with their diagnosis.  Patient continues to embrace ways to take steps toward his independence and self acceptance.   ?  ?Long-term goal:   ?Reduce overall level, frequency, and intensity of the feelings of anxiety in social situations from every occurrence causing anxiety to 1 our of 5 occurrences.   ?  ?  ?Short-term goal:  ?Patient to continue to work on identifying thoughts that make his anxiety increased in social situations and to continue  to allow himself to engage in social situations to continue to try and work past his anxiety.   ?Patient to ground himself with rational, calming self talk when having intrusive thoughts. ?Increased feelings of self-worth by taking steps toward achieving more independence by finding and maintaining employment ? ? ? ?Assessment of progress:  progressing ? ? ?This record has been created using [employer].  Chart creation errors have been sought, but may not always have been located and corrected. Such creation errors do not reflect on the standard of medical care.   ? ? ? ?[name], [facility]  ? ? ? ?

## 2022-02-20 NOTE — Progress Notes (Signed)
NURSE NOTE: ? ?Pt here for his monthly injectable of Invega Sustenna 234 mg/1.5 ml given in his right upper glutea area. Pt tolerated well with no complaints. Pt is doing well at his job, pt in pleasant mood. Pt is seeing Lanetta Inch after this apt for counseling. Pt is to schedule next injection 28 days. Instructed to call if having any issues or concerns.  ? ? ?[id number] ?EXP 07-2023 ?

## 2022-03-06 ENCOUNTER — Ambulatory Visit (INDEPENDENT_AMBULATORY_CARE_PROVIDER_SITE_OTHER): Payer: BC Managed Care – PPO | Admitting: Mental Health

## 2022-03-06 DIAGNOSIS — F259 Schizoaffective disorder, unspecified: Secondary | ICD-10-CM

## 2022-03-06 NOTE — Progress Notes (Signed)
[facility] Psychotherapy Note ? ?Name: Scott Vargas ?Date: 03/06/2022 ?MRN: [id number] ?DOB: 01/01/1996 ?PCP: [name], MD ? ?Time spent: 52 minutes ? ?Treatment:  individual therapy ? ?Mental Status Exam: ?   ?Appearance:    Casual     ?Behavior:   Appropriate  ?Motor:   WNL  ?Speech/Language:    Clear and Coherent  ?Affect:   Full range   ?Mood:   Euthymic  ?Thought process:   normal  ?Thought content:     WNL  ?Sensory/Perceptual disturbances:     none  ?Orientation:   x4  ?Attention:   Good  ?Concentration:   Good  ?Memory:   Intact  ?Fund of knowledge:    Consistent with age and development  ?Insight:     Good  ?Judgment:    Good  ?Impulse Control:   Good  ?  ? ?Reported Symptoms:  anxiety, depression, rumination, avoidant behavior (crowds at times) ? ?Risk Assessment: ?Danger to Self:  No ?Self-injurious Behavior: No ?Danger to Others: No ?Duty to Warn:no ?Physical Aggression / Violence:No  ?Access to Firearms a concern: No  ?Gang Involvement:No  ?Patient / guardian was educated about steps to take if suicide or homicide risk level increases between visits: yes ?While future psychiatric events cannot be accurately predicted, the patient does not currently require acute inpatient psychiatric care and does not currently meet [facility] involuntary commitment criteria. ? ? ?Diagnoses:  ?  ICD-10-CM   ?1. Schizoaffective disorder, unspecified type (HCC)  F25.9   ?  ? ? ? ? ?Subjective:   ?Patient presents on time for today's session.  Progress toward achieving goals as well as relevant recent events was discussed.  He stated that he continues to work in his job full-time.  He went on to share how he has struggled with energy levels at times throughout the day, sleeps about 10 to 12 hours per night most nights.  He stated he also continues to struggle with losing weight.  He stated he feels it may be in part due to his medications and plans to further discuss at his next med management  appointment.  Reports having vivid dreams, continues to hear voices which can be negative statements but no commands.  Reports hearing the voices for most of his life going back to childhood.  Reports history of having concussions on 3 different occasions, ages 29, 49, 17 and speculates if those play a factor with any of his mental health issues.  Denies paranoia.  Shared more specifics details related to his history when going inpatient about 3 years ago.  At that time, girlfriend broke up with him leaving him devastated, resulting in his later receiving a restraining order due to threats he made to her boyfriend she had shortly thereafter.  He stated that he also lost his job and was receiving unemployment at that time, had to engage in a stressful court hearing with his previous employer.  He also has lost a considerable amount of money and stock purchases.  All of these events leading up to his inpatient admission in 2020. ?Through guided discovery, he identified the need to work on being more social, get out of the house more when he is off work and spend time with friends, continues to want a girlfriend.  Facilitated his identifying and framing needs as well as steps. ? ? ?Interventions:   CBT, supportive therapy ? ?Plan: Patient is to use CBT, mindfulness and coping skills to help manage decrease symptoms associated  with their diagnosis.  Patient continues to embrace ways to take steps toward his independence and self acceptance.  Patient to work on being more social, spend time with friends when he is off work. ?  ?Long-term goal:   ?Reduce overall level, frequency, and intensity of the feelings of anxiety in social situations from every occurrence causing anxiety to 1 our of 5 occurrences.   ?  ?  ?Short-term goal:  ?Patient to continue to work on identifying thoughts that make his anxiety increased in social situations and to continue to allow himself to engage in social situations to continue to try and  work past his anxiety.   ?Patient to ground himself with rational, calming self talk when having intrusive thoughts. ?Increased feelings of self-worth by taking steps toward achieving more independence by finding and maintaining employment ? ? ? ?Assessment of progress:  progressing ? ? ?This record has been created using [employer].  Chart creation errors have been sought, but may not always have been located and corrected. Such creation errors do not reflect on the standard of medical care.  ? ? ? ?Waldron Session, [facility]  ? ? ? ?

## 2022-03-18 ENCOUNTER — Encounter: Payer: Self-pay | Admitting: Psychiatry

## 2022-03-18 ENCOUNTER — Ambulatory Visit (INDEPENDENT_AMBULATORY_CARE_PROVIDER_SITE_OTHER): Payer: BC Managed Care – PPO | Admitting: Psychiatry

## 2022-03-18 DIAGNOSIS — F411 Generalized anxiety disorder: Secondary | ICD-10-CM | POA: Diagnosis not present

## 2022-03-18 DIAGNOSIS — F5105 Insomnia due to other mental disorder: Secondary | ICD-10-CM

## 2022-03-18 DIAGNOSIS — F259 Schizoaffective disorder, unspecified: Secondary | ICD-10-CM

## 2022-03-18 DIAGNOSIS — F515 Nightmare disorder: Secondary | ICD-10-CM

## 2022-03-18 DIAGNOSIS — G4714 Hypersomnia due to medical condition: Secondary | ICD-10-CM

## 2022-03-18 MED ORDER — MODAFINIL 200 MG PO TABS: ORAL_TABLET | ORAL | 1 refills | Status: DC

## 2022-03-18 NOTE — Progress Notes (Signed)
Scott Vargas [id number] 11/18/1995 26 y.o.  Subjective:   Patient ID:  Scott Vargas is a 26 y.o. (DOB 11/18/1995) malCline Vargas.  Chief Complaint:  Chief Complaint  Patient presents with   Follow-up   Medication Problem    HPI Scott Vargas presents to the office today for follow-up of schizoaffective disorder with recent hospitalizations. First seen in our office 12/29/2020.  His symptoms were improved but not resolved and included depression, paranoia, intrusive violent images, chronic suicidal thoughts. He was just out of the hospital so no medications were in changed which included Invega IM every 4 weeks, risperidone 2 mg nightly, prazosin 2 mg nightly, trazodone 100 mg nightly, hydroxyzine 50 mg 3 times daily as needed  01/12/2021 appointment with the following noted: Working and going pretty smooth.  Scott HowardHung out with friends and it went better than I thought. 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   No pot use since here. Mood pretty chill. Still gets voices negative and positive like "people don't like you".  Voices come and go.  Dealt with it a long time but never told anyone.  Better if busy.   Not sure about character of voices.  They come to me.  Worse when not busy and any time of day.  Voices are frustrating.  Voices got worse July 2020 in sprittual experience.  Not depressed.  Pleased friends accepted him better than expected. [facility] which is more centered spiriitually.   Still seeing therapist. Some people bother him but not sure why.  Trying to make new friends. In [name] got 2 restraining orders from 2 different churches.  No outstanding charges. Alcohol 2 -3 per week. Started Depakote without problems.  Pretty well with anger. Sleep OK with mixed type.  NM awaken him once weekly. No SE.  Then with parents Scott MortimerWanda & dad Not as depressed as he was. Scott Vargas & Scott Vargas notes increase anxiety as got closer to injection date.  Scott Vargas sees it's "night and day"   Difference and I have my son back. Scott Vargas agrees he's exhibited bravery in doing difficult things. Last injection date March 12- [facility] [place] [place].  Plan: Get next Scott Cerisenvega Sustaina 1 week early by 1 week for a couple of mos DT observed wearing off.  03/07/2021 appointment with the following noted:  Alone and with Mother Last received Invega Sustenna 234 mg per 1.5 mL injection on 02/26/2021.  Last 2 injections have it 3-week intervals instead of typical 4-week intervals bc of ongoing psychotic sx. Doing well and exercising and biking.  Doing ok with voices.  Still hear them somewhat but less prominent and ignoring them more.  Mood pretty calm.  Not lately much fear or anxiety.  Maybe a little better with small talk with people. 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 Good work function. No problems with meds.  Sometimes lower motivation.    Less hydroxyzine 1/week.   Dreams good and bad still.  Rarely will be awakened by them. No anger outbursts. Mild paranoia out in public about what people are thinking. With Mom:  Superb cp to 2 mos ago.  Some days lacks motivation.  Better on days he works. Plan: Trial L-carnitine 1000 mg 1 twice daily to see if energy and focus are better. Change Invega Sustaina back to every 4 weeks at 234mg  q weeks   05/31/2021 appt noted: Called a couple days ago complaining of more anxiety. Lately with job search and mostly unemployed has a lot of  free time and not feeling good about himself.  Looking for job through [employer]. Sees GF regularly in [place].  Likes the summer. When in groups or around strangers has anxiety but it is improved over the past.  Does not feel as paranoid.  But will feel people are looking at him too much.  Causes anxiety but not anger.  Makes me not want to be around people.Takes hydroxyzine with anxiety and it helps. AH "not so bad".  Dreams can be scary or disturbing.   My mind was hijacked several months ago.  Meds leveled me out.   Taking  trazodone at night for sleep.  Tolerating meds except weight gain from 170 to 210# over the year.  Hungry all the time. Risperidone has not bee taken. A/P: overall psychotic sx improved not resolved hydroxyzine instead of trazodone for sleep to see if dreams are better. Continue Scott Vargas back to every 4 weeks at 234mg  q weeks  Add risperidone 1/2 tablet nightly for 1-2 weeks and if no improvement in paranoia and anxiety then increase to 1 nightly  08/13/21 appt noted: alone and with mother Usually sleep good.  Rare NM. Better comfort around people. Went to 08/15/21. No paranoia.  AH less bothersome. Mainly evident intermittently.  Better than last visit. No depression. SE none except weight.   Energy still kind of low. 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 Scott Vargas sees a lot of improvement this year. [name] done well with counseling per pt and Scott Vargas.  She's concerned about his sleep about 12 hour and longer if nothing scheduled.   He says he doesn't have much to do & lacks motivation to exercise as in past.   Plan:  Continue hydroxyzine instead of trazodone for sleep bc dreams are better. Continue Invega Sustaina back to every 4 weeks at 234mg  q weeks DT observed wearing off.   Continue risperidone 1 mg nightly bc paranoia and voices are better.  10/18/21 appt noted: alone and with father Stopped all tablets including Depakote, risperidone bc didn't think he needed them. Thinks he needs hydroxyzine 50 hs for sleep. No violent thoughts off Depakote.   AH about the same or better off risperidone. Se with them he thinks he felt a little question.  Energy is better right now.  Not needing to nap in afternoon.  Still dreams a lot.  NM once weekly.  Rather not take the prazosin right now..   A little social avoidance ongoing but when he does seems ok. Disappointed he didn't get job at .    Not currently working anywhere. No problems with shot except sore for a wek after the  shot.   Is video gaming again some now and wasn't for awhile.  Including complex games. Scott Vargas says invega has been a miracle drug. Plan: Continue 10/20/21 back to every 4 weeks at 234mg  q weeks  OK to stop risperidone id not needed for voices.  01/16/2022 appointment with the following noted: Sleep ok off hyrdroxyzine and prazosin without NM. Sleep 10 hours.   No risperidone. No voices but some intrusive thoughts that are not too bad. New job helps his negative thoughts.  Able to manage anger.  But can have thoughts of hurting others that provoke him couple times per week. No paranoid or fearful thoughts.  No acting ou tof anger.   Greater sense of purpose now that FT job.   No depression. Consistent with scheduled injection.   Plan: No med changes  03/18/22 appt noted: Has been off risperidone since December 2022. Still hear the voices but not focusing on them.  Comes and goes and more when quiet and when busy not as much.  Voices remind him of things and can be positive or negative.  Reminded frequently of relationship struggles and how his mental health affected his relationships.   Not socially active. Busy most of the time with work.  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  Works for [employer].  Louann Sjogren is great. No longer has guns.  At workplace does have coworker who yells hat him but learning to tolerate him. Propranolol helps him deal with this at work. Getting counseling through Elio Forget.   Sleeps 11-12 hours Weight gain 220.  2 years ago was 170# before psychosis and meds. CO SE weight gain, fatigue, sleepiness with Invega sustena and asksabout alternatives. Asks if he might eventually be able to stop antipsychotic.  Past Psychiatric Medication Trials: History Dr. Krista Blue outpatient [facility] hosp 10/2020 Lorazepam Propranolol Lamotrigine Invega sustaina started Jan 2022 in hospital Scott Vargas Risperidone 6 Abilify 10 NR and wt gain Lithium 1200 in  2020 level 0.8 Trazodone, hydroxyzine, prazosin lithium Depakote 1000 started 12/29/20  Review of Systems:  Review of Systems  Constitutional:  Positive for fatigue and unexpected weight change.  Cardiovascular:  Negative for chest pain and palpitations.  Neurological:  Negative for tremors, weakness and headaches.  Psychiatric/Behavioral:  Negative for suicidal ideas.    Medications: I have reviewed the patient's current medications.  Current Outpatient Medications  Medication Sig Dispense Refill   Cetirizine HCl (ZYRTEC ALLERGY PO) Take by mouth at bedtime. Unsure of dosing. Takes with Benadryl for allergies and sleep.     diphenhydrAMINE (BENADRYL) 25 MG tablet Take 50 mg by mouth at bedtime as needed. Taking for allergies and a sleep aid     hydrOXYzine (ATARAX) 25 MG tablet TAKE 1-2 TABLETS AT NIGHT FOR SLEEP (Patient taking differently: TAKE 1-2 TABLETS AT NIGHT FOR SLEEP - takes "somewhat") 180 tablet 0   INVEGA SUSTENNA 234 MG/1.5ML SUSY injection INJECT 1.5ML INTRAMUSCULARLY EVERY 4 WEEKS 1.5 mL 5   modafinil (PROVIGIL) 200 MG tablet 1/2 table the morning for 1 week then 1 each morning 30 tablet 1   propranolol (INDERAL) 20 MG tablet TAKE 1-2 TABLETS IN THE AM AS NEEDED FOR WORK STRESS 180 tablet 0   No current facility-administered medications for this visit.    Medication Side Effects: weight  Allergies: No Known Allergies  Past Medical History:  Diagnosis Date   Migraine     History reviewed. No pertinent family history.  Social History   Socioeconomic History   Marital status: Single    Spouse name: Not on file   Number of children: Not on file   Years of education: Not on file   Highest education level: Not on file  Occupational History   Not on file  Tobacco Use   Smoking status: Light Smoker   Smokeless tobacco: Never  Substance and Sexual Activity   Alcohol use: Not on file   Drug use: Not on file   Sexual activity: Not on file  Other Topics  Concern   Not on file  Social History Narrative   Not on file   Social Determinants of Health   Financial Resource Strain: Not on file  Food Insecurity: Not on file  Transportation Needs: Not on file  Physical Activity: Not on file  Stress: Not on file  Social  Connections: Not on file  Intimate Partner Violence: Not on file    Past Medical History, Surgical history, Social history, and Family history were reviewed and updated as appropriate.   Fraternal twin brother 2 other brothers  Please see review of systems for further details on the patient's review from today.   Objective:   Physical Exam:  There were no vitals taken for this visit.  Physical Exam Constitutional:      General: He is not in acute distress. Musculoskeletal:        General: No deformity.  Neurological:     Mental Status: He is alert and oriented to person, place, and time.     Coordination: Coordination normal.  Psychiatric:        Attention and Perception: He is attentive. He perceives auditory hallucinations. He does not perceive visual hallucinations.        Mood and Affect: Mood is anxious. Mood is not depressed. Affect is not labile, blunt, angry or inappropriate.        Speech: Speech normal.        Behavior: Behavior normal.        Thought Content: Thought content is not paranoid or delusional. Thought content does not include homicidal or suicidal ideation. Thought content does not include suicidal plan.        Cognition and Memory: Cognition and memory normal.        Judgment: Judgment normal.     Comments: Insight intact Mild paranoia resolved Voices mild but not gone and can be bothersome.  Some discomfort around people..  No HI or intent. Mild inattention mostly Limited eye contact but  pleasant.    Lab Review:     Component Value Date/Time   NA 138 08/25/2020 1522   K 4.1 08/25/2020 1522   CL 101 08/25/2020 1522   CO2 26 08/25/2020 1522   GLUCOSE 95 08/25/2020 1522   BUN 12  08/25/2020 1522   CREATININE 0.92 08/25/2020 1522   CALCIUM 9.1 08/25/2020 1522   PROT 7.0 08/25/2020 1522   ALBUMIN 4.2 08/25/2020 1522   AST 21 08/25/2020 1522   ALT 17 08/25/2020 1522   ALKPHOS 59 08/25/2020 1522   BILITOT 0.7 08/25/2020 1522   GFRNONAA >60 08/25/2020 1522       Component Value Date/Time   WBC 8.2 08/25/2020 1522   RBC 5.20 08/25/2020 1522   HGB 14.8 08/25/2020 1522   HCT 45.6 08/25/2020 1522   PLT 293 08/25/2020 1522   MCV 87.7 08/25/2020 1522   MCH 28.5 08/25/2020 1522   MCHC 32.5 08/25/2020 1522   RDW 12.1 08/25/2020 1522    No results found for: POCLITH, LITHIUM   No results found for: PHENYTOIN, PHENOBARB, VALPROATE, CBMZ   Normal Head CT scan.  They asked whether further neuro work-up was indicated and there does not appear to be focal neurologic signs or symptoms to suggest that.  Genesight testing disc in detail.  Disc Mother's concerns with his lack of motivation and energy and lack of self direction.  .res Assessment: Plan:    Ugochukwu was seen today for follow-up and medication problem.  Diagnoses and all orders for this visit:  Schizoaffective disorder, unspecified type (HCC)  Generalized anxiety disorder  Nightmare  Insomnia due to mental condition  Hypersomnolence disorder, with medical condition, persistent, moderate -     modafinil (PROVIGIL) 200 MG tablet; 1/2 table the morning for 1 week then 1 each morning   Greater than 50% of 50  min face to face time with patient and at times with his mother was spent on counseling and coordination of care. We extensively discussed the new diagnosis of schizoaffective disorder with the patient and his mom.  We discussed prognosis.  We discussed factors such as marijuana use which could worsen outcome and is likely to do so.  He still uses on occasion.  This is dangerous.  We discussed the importance of med compliance and how that can change future outcomes with psychotic symptoms as well as  relapse risk.  We discussed side effects of antipsychotic medications.  We discussed the trial and error nature of finding the optimal antipsychotic at optimal dose.    Overall his psychotic symptoms are much improved but not resolved..Limited paranoia but voices continue intermittently.  .  Still some intrusive hostile thoughts and social anxiety   Continue counseling with Elio Forget  and work on Pharmacist, community and self care   Support groups [facility], [place], NAMI.  Going to [employer] support group by Zoom a couple of times.  Faith based.  Continue Scott Vargas every 4 weeks at  q weeks .  We discussed that it can take several months for long-acting injectables to have optimal effect and reach steady state.  He is tolerating the antipsychotic well.  Consider alternatives DT SE Vraylar, Latuda, Caplyta.   Consider modafinil Yes start modafinil 100 mg every morning for 1 week and then 200 mg daily.  This is less likely to risk relapse of psychosis than would be switching the antipsychotic..  We had an extensive discussion about the current side effects of antipsychotics that he is experiencing and the potential for switch but that is very risky.  Propranolol 20-40 mg before work to deal with anxiety and neg thoughts with stressful coworker.  Disc dietary issues and risk weight gain.  Discussed potential metabolic side effects associated with atypical antipsychotics, as well as potential risk for movement side effects. Advised pt to contact office if movement side effects occur.  Option metformin, he declines   Tobacco use only rarely bc DDI.  Avoid THC.   Encourage exercise and stimulation.  Supportive therapy for pt and family dealing with his new onset psychosis. Encoourage NAMI for mom.  Call if somethings gers worse esp at work.  FU 8 weeks  Meredith Staggers, MD, DFAPA   Please see After Visit Summary for patient specific instructions.  Future Appointments  Date Time  Provider Department Center  03/20/2022  4:30 PM CP-NURSE CP-CP None  03/20/2022  5:00 PM Waldron Session, [facility] CP-CP None  04/08/2022  5:00 PM Waldron Session, [facility] CP-CP None  04/18/2022  4:30 PM CP-NURSE CP-CP None  05/02/2022  5:00 PM Waldron Session, [facility] CP-CP None  05/16/2022  5:00 PM Waldron Session, [facility] CP-CP None    No orders of the defined types were placed in this encounter.    -------------------------------

## 2022-03-20 ENCOUNTER — Ambulatory Visit (INDEPENDENT_AMBULATORY_CARE_PROVIDER_SITE_OTHER): Payer: BC Managed Care – PPO | Admitting: Mental Health

## 2022-03-20 ENCOUNTER — Ambulatory Visit (INDEPENDENT_AMBULATORY_CARE_PROVIDER_SITE_OTHER): Payer: BC Managed Care – PPO

## 2022-03-20 DIAGNOSIS — F259 Schizoaffective disorder, unspecified: Secondary | ICD-10-CM | POA: Diagnosis not present

## 2022-03-20 NOTE — Progress Notes (Signed)
NURSE NOTE:   Pt here for his monthly injectable of Invega Sustenna 234 mg/1.5 ml given in his left upper glutea area. Pt tolerated well with no complaints. Pt is doing well at his job, pt in pleasant mood. Pt is seeing Elio Forget after this apt for counseling. Pt is to schedule next injection 28 days. Instructed to call if having any issues or concerns.   pt will schedule next apt in 28 days.    [id number] EXP 07-2023

## 2022-03-20 NOTE — Progress Notes (Signed)
[facility] Psychotherapy Note  Name: Scott Vargas Date: 03/20/2022 MRN: [id number] DOB: 02-Dec-1995 PCP: [name], MD  Time spent: 55 minutes  Treatment:  individual therapy  Mental Status Exam:    Appearance:    Casual     Behavior:   Appropriate  Motor:   WNL  Speech/Language:    Clear and Coherent  Affect:   Full range   Mood:   Euthymic  Thought process:   normal  Thought content:     WNL  Sensory/Perceptual disturbances:     none  Orientation:   x4  Attention:   Good  Concentration:   Good  Memory:   Intact  Fund of knowledge:    Consistent with age and development  Insight:     Good  Judgment:    Good  Impulse Control:   Good     Reported Symptoms:  anxiety, depression, rumination, avoidant behavior (crowds at times)  Risk Assessment: Danger to Self:  No Self-injurious Behavior: No Danger to Others: No Duty to Warn:no Physical Aggression / Violence:No  Access to Firearms a concern: No  Gang Involvement:No  Patient / guardian was educated about steps to take if suicide or homicide risk level increases between visits: yes While future psychiatric events cannot be accurately predicted, the patient does not currently require acute inpatient psychiatric care and does not currently meet [facility] involuntary commitment criteria.   Diagnoses:    ICD-10-CM   1. Schizoaffective disorder, unspecified type (HCC)  F25.9          Subjective:   Patient presents on time for today's session.  Patient stated that he had a good meeting with Dr. Jennelle Human to discuss his medications, at this point he states there has been no change but remains hopeful that this can be a possibility.  He expresses understanding about it being an important component of his treatment and plans to adhere to his prescribed regimen.  He went on to share experiences at work, identified a positive experience being able to eat with his 2 other coworkers as he was invited by them  recently.  He shared thoughts, feelings experiences at work with a desire to do well at this job and also get along well with his coworkers; he stated that he has had a challenge with this in the past at previous jobs.  He looks forward to having some time off, is going with family on vacation to the beach, stated that his father is not going as his parents continue to have some relational strain.  Time was spent for him to share additional thoughts and feelings related. He continues to make some efforts to engage socially with others, recognizes this being needed to improve his mood, ultimately would like to have a girlfriend again at some point. Facilitated his framing and sharing thoughts related to progress he has made where he recognizes the benefits of his going back to work, giving himself time to adjust and taking steps in other areas with personal relationship development through making efforts to cultivate friendships.  Interventions:   CBT, supportive therapy  Plan: Patient is to use CBT, mindfulness and coping skills to help decrease symptoms associated with their diagnosis.  Patient continues to embrace ways to take steps toward his independence and self acceptance.  Patient to work on being more social, spend time with friends when he is off work.   Long-term goal:   Reduce overall level, frequency, and intensity of the feelings of anxiety  in social situations from every occurrence causing anxiety to 1 our of 5 occurrences.       Short-term goal:  Patient to continue to work on identifying thoughts that make his anxiety increased in social situations and to continue to allow himself to engage in social situations to continue to try and work past his anxiety.   Patient to ground himself with rational, calming self talk when having intrusive thoughts. Increased feelings of self-worth by taking steps toward achieving more independence by finding and maintaining employment    Assessment  of progress:  progressing   This record has been created using [employer].  Chart creation errors have been sought, but may not always have been located and corrected. Such creation errors do not reflect on the standard of medical care.     Waldron Session, [facility]

## 2022-04-08 ENCOUNTER — Ambulatory Visit: Payer: BC Managed Care – PPO | Admitting: Mental Health

## 2022-04-18 ENCOUNTER — Ambulatory Visit (INDEPENDENT_AMBULATORY_CARE_PROVIDER_SITE_OTHER): Payer: BC Managed Care – PPO

## 2022-04-18 ENCOUNTER — Ambulatory Visit: Payer: BC Managed Care – PPO | Admitting: Mental Health

## 2022-04-18 DIAGNOSIS — F259 Schizoaffective disorder, unspecified: Secondary | ICD-10-CM

## 2022-04-18 NOTE — Progress Notes (Signed)
NURSE NOTE:   Pt here for his monthly injectable of Invega Sustenna 234 mg/1.5 ml given in his left upper glutea area. Pt tolerated well with no complaints. Pt is doing well at his job, pt in pleasant mood. Pt is to schedule next injection 28 days. Instructed to call if having any issues or concerns.     LOT [id number] EXP 09-2023

## 2022-05-02 ENCOUNTER — Ambulatory Visit (INDEPENDENT_AMBULATORY_CARE_PROVIDER_SITE_OTHER): Payer: BC Managed Care – PPO | Admitting: Mental Health

## 2022-05-02 DIAGNOSIS — F259 Schizoaffective disorder, unspecified: Secondary | ICD-10-CM

## 2022-05-02 NOTE — Progress Notes (Signed)
[facility] Psychotherapy Note  Name: Scott Vargas Date: 05/07/2022 MRN: [id number] DOB: 09/11/1996 PCP: [name], MD  Time spent: 55 minutes  Treatment:  individual therapy  Mental Status Exam:    Appearance:    Casual     Behavior:   Appropriate  Motor:   WNL  Speech/Language:    Clear and Coherent  Affect:   Full range   Mood:   Euthymic  Thought process:   normal  Thought content:     WNL  Sensory/Perceptual disturbances:     none  Orientation:   x4  Attention:   Good  Concentration:   Good  Memory:   Intact  Fund of knowledge:    Consistent with age and development  Insight:     Good  Judgment:    Good  Impulse Control:   Good     Reported Symptoms:  anxiety, depression, rumination, avoidant behavior (crowds at times)  Risk Assessment: Danger to Self:  No Self-injurious Behavior: No Danger to Others: No Duty to Warn:no Physical Aggression / Violence:No  Access to Firearms a concern: No  Gang Involvement:No  Patient / guardian was educated about steps to take if suicide or homicide risk level increases between visits: yes While future psychiatric events cannot be accurately predicted, the patient does not currently require acute inpatient psychiatric care and does not currently meet [facility] involuntary commitment criteria.   Diagnoses:    ICD-10-CM   1. Schizoaffective disorder, unspecified type (HCC)  F25.9           Subjective:   Patient presents on time for today's session.  Patient shared relevant recent events with a focus on employment. He identified efforts he has made to be successful at his current job in which he is generally satisfied, however, a recent issue between you and one of his co-workers occurred. This calendar often ordering him around throughout the day and also with a tendency for this individual to become irritable, agitated without provocation. 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  I'll be at initially confused, patient shared his efforts to continue to work with this individual with success, providing details. Facilitate his identifying the various ways he handled the situation effectively and how this is a contrast from some past experiences with previous employment for his reported history. Additionally, family relationships and experience or assessed. Patient continues to tolerate his medication as well for his report, plans to follow up with his next medication management appointment with Dr Jennelle Human.   Interventions:   CBT, supportive therapy  Plan: Patient is to use CBT, mindfulness and coping skills to help decrease symptoms associated with their diagnosis.  Patient continues to embrace ways to take steps toward his independence and self acceptance.  Patient to work on being more social, spend time with friends when he is off work.   Long-term goal:   Reduce overall level, frequency, and intensity of the feelings of anxiety in social situations from every occurrence causing anxiety to 1 our of 5 occurrences.       Short-term goal:  Patient to continue to work on identifying thoughts that make his anxiety increased in social situations and to continue to allow himself to engage in social situations to continue to try and work past his anxiety.  Patient to ground himself with rational, calming self talk when having intrusive thoughts. Increased feelings of self-worth by taking steps toward achieving more independence by finding and maintaining employment    Assessment of progress:  progressing   This record has been created using [employer].  Chart creation errors have been sought, but may not always have been located and corrected. Such creation errors do not reflect on the standard of  medical care.     Waldron Session, [facility]

## 2022-05-13 ENCOUNTER — Encounter: Payer: Self-pay | Admitting: Psychiatry

## 2022-05-13 ENCOUNTER — Ambulatory Visit (INDEPENDENT_AMBULATORY_CARE_PROVIDER_SITE_OTHER): Payer: BC Managed Care – PPO | Admitting: Psychiatry

## 2022-05-13 DIAGNOSIS — F515 Nightmare disorder: Secondary | ICD-10-CM | POA: Diagnosis not present

## 2022-05-13 DIAGNOSIS — L255 Unspecified contact dermatitis due to plants, except food: Secondary | ICD-10-CM | POA: Diagnosis not present

## 2022-05-13 DIAGNOSIS — F259 Schizoaffective disorder, unspecified: Secondary | ICD-10-CM | POA: Diagnosis not present

## 2022-05-13 DIAGNOSIS — F411 Generalized anxiety disorder: Secondary | ICD-10-CM | POA: Diagnosis not present

## 2022-05-13 DIAGNOSIS — L282 Other prurigo: Secondary | ICD-10-CM | POA: Diagnosis not present

## 2022-05-13 DIAGNOSIS — G4714 Hypersomnia due to medical condition: Secondary | ICD-10-CM | POA: Diagnosis not present

## 2022-05-13 MED ORDER — INVEGA SUSTENNA 156 MG/ML IM SUSY: 156.0000 mg | PREFILLED_SYRINGE | INTRAMUSCULAR | 3 refills | Status: DC

## 2022-05-13 NOTE — Progress Notes (Signed)
Scott Vargas [id number] 1996/06/22 26 y.o.  Subjective:   Patient ID:  Scott Vargas is a 26 y.o. (DOB [birth year]) male.  Chief Complaint:  Chief Complaint  Patient presents with   Follow-up    Schizoaffective disorder, unspecified type (HCC)   Anxiety    HPI [name] presents to the office today for follow-up of schizoaffective disorder with recent hospitalizations. First seen in our office 12/29/2020.  His symptoms were improved but not resolved and included depression, paranoia, intrusive violent images, chronic suicidal thoughts. He was just out of the hospital so no medications were in changed which included Invega IM every 4 weeks, risperidone 2 mg nightly, prazosin 2 mg nightly, trazodone 100 mg nightly, hydroxyzine 50 mg 3 times daily as needed  01/12/2021 appointment with the following noted: Working and going pretty smooth.  Scott Vargas out with friends and it went better than I thought. 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   No pot use since here. Mood pretty chill. Still gets voices negative and positive like "people don't like you".  Voices come and go.  Dealt with it a long time but never told anyone.  Better if busy.   Not sure about character of voices.  They come to me.  Worse when not busy and any time of day.  Voices are frustrating.  Voices got worse July 2020 in sprittual experience.  Not depressed.  Pleased friends accepted him better than expected. Attending young adults group at [facility] which is more centered spiriitually.   Still seeing therapist. Some people bother him but not sure why.  Trying to make new friends. In [place] got 2 restraining orders from 2 different churches.  No outstanding charges. Alcohol 2 -3 per week. Started Depakote without problems.  Pretty well with anger. Sleep OK with mixed type.  NM awaken him once weekly. No SE.  Then with parents [name] & dad Not as depressed as he was. M & F notes increase anxiety as got closer to  injection date.  F sees it's "night and day"  Difference and I have my son back. M agrees he's exhibited bravery in doing difficult things. Last injection date March 12- day outpatient [facility] [place] [place].  Plan: Get next [name] 1 week early by 1 week for a couple of mos DT observed wearing off.  03/07/2021 appointment with the following noted:  Alone and with Mother Last received Invega Sustenna 234 mg per 1.5 mL injection on 02/26/2021.  Last 2 injections have it 3-week intervals instead of typical 4-week intervals bc of ongoing psychotic sx. Doing well and exercising and biking.  Doing ok with voices.  Still hear them somewhat but less prominent and ignoring them more.  Mood pretty calm.  Not lately much fear or anxiety.  Maybe a little better with small talk with people. 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 No problems with meds.  Sometimes lower motivation.    Less hydroxyzine 1/week.   Dreams good and bad still.  Rarely will be awakened by them. No anger outbursts. Mild paranoia out in public about what people are thinking. With Mom:  Superb cp to 2 mos ago.  Some days lacks motivation.  Better on days he works. Plan: Trial L-carnitine 1000 mg 1 twice daily to see if energy and focus are better. Change Invega Sustaina back to every 4 weeks at 234mg  q weeks   05/31/2021 appt noted: Called a couple days ago complaining of more anxiety. Lately with job search  and mostly unemployed has a lot of free time and not feeling good about himself.  Looking for job through [place]. Sees GF regularly in [place].  Likes the summer. When in groups or around strangers has anxiety but it is improved over the past.  Does not feel as paranoid.  But will feel people are looking at him too much.  Causes anxiety but not anger.  Makes me not want to be around people.Takes hydroxyzine with anxiety and it helps. AH "not so bad".  Dreams can be scary or disturbing.   My mind was hijacked several  months ago.  Meds leveled me out.   Taking trazodone at night for sleep.  Tolerating meds except weight gain from 170 to 210# over the year.  Hungry all the time. Risperidone has not bee taken. A/P: overall psychotic sx improved not resolved hydroxyzine instead of trazodone for sleep to see if dreams are better. Continue Wende Neighbors back to every 4 weeks at 234mg  q weeks  Add risperidone 1/2 tablet nightly for 1-2 weeks and if no improvement in paranoia and anxiety then increase to 1 nightly  08/13/21 appt noted: alone and with mother Usually sleep good.  Rare NM. Better comfort around people. Went to [employer]. No paranoia.  AH less bothersome. Mainly evident intermittently.  Better than last visit. No depression. SE none except weight.   Energy still kind of low. 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 M sees a lot of improvement this year. Tery done well with counseling per pt and M.  She's concerned about his sleep about 12 hour and longer if nothing scheduled.   He says he doesn't have much to do & lacks motivation to exercise as in past.   Plan:  Continue hydroxyzine instead of trazodone for sleep bc dreams are better. Continue Invega Sustaina back to every 4 weeks at 234mg  q weeks DT observed wearing off.   Continue risperidone 1 mg nightly bc paranoia and voices are better.  10/18/21 appt noted: alone and with father Stopped all tablets including Depakote, risperidone bc didn't think he needed them. Thinks he needs hydroxyzine 50 hs for sleep. No violent thoughts off Depakote.   AH about the same or better off risperidone. Se with them he thinks he felt a little question.  Energy is better right now.  Not needing to nap in afternoon.  Still dreams a lot.  NM once weekly.  Rather not take the prazosin right now..   A little social avoidance ongoing but when he does seems ok. Disappointed he didn't get job at [employer].    Not currently working anywhere. No problems  with shot except sore for a wek after the shot.   Is video gaming again some now and wasn't for awhile.  Including complex games. F says invega has been a miracle drug. Plan: Continue Wende Neighbors back to every 4 weeks at 234mg  q weeks  OK to stop risperidone id not needed for voices.  01/16/2022 appointment with the following noted: Sleep ok off hyrdroxyzine and prazosin without NM. Sleep 10 hours.   No risperidone. No voices but some intrusive thoughts that are not too bad. New job helps his negative thoughts.  Able to manage anger.  But can have thoughts of hurting others that provoke him couple times per week. No paranoid or fearful thoughts.  No acting ou tof anger.   Greater sense of purpose now that FT job.   No depression. Consistent with scheduled  injection.   Plan: No med changes  03/18/22 appt noted: Has been off risperidone since December 2022. Still hear the voices but not focusing on them.  Comes and goes and more when quiet and when busy not as much.  Voices remind him of things and can be positive or negative.  Reminded frequently of relationship struggles and how his mental health affected his relationships.   Not socially active. Busy most of the time with work.  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  Works for [employer].  Louann Sjogren is great. No longer has guns.  At workplace does have coworker who yells hat him but learning to tolerate him. Propranolol helps him deal with this at work. Getting counseling through [facility].   Sleeps 11-12 hours Weight gain 220.  2 years ago was 170# before psychosis and meds. CO SE weight gain, fatigue, sleepiness with Invega sustena and asksabout alternatives. Asks if he might eventually be able to stop antipsychotic. Plan: start modafinil 100 mg every morning for 1 week and then 200 mg daily for sleepiness.  Continue antipsychotic  05/13/22 appt noted: Has been consistent with monthly Invega Sustenna 234  mg per 1.5 mL IM SE wt gain and sleepy.  Hungry. Thinks invega dulled excitement to mountain bike and doesn't do it. Started prednisone today for poison ivy. Used 1/2 bottle modafinil and more alert at work.  Able to stay up later.  Uses it prn.  SE HA initially resolved. Sometimes trouble falling asleep.  Still vivid dreams and some NM.  Themes violent or sexual. It really affected him before the hsopital bc thought they were reality.    Now can tell the difference. Work performance affected by weight.  But still doing ok with work. Voices not very noticeable usually.   No sig paranoia but socially isolated and content about that.  Realizes it's not great long term.  Social interaction at work.  Was not confident talking to servers at restaurants and that's fine now. Aware he has trouble making eye contact with people. No longer feels hostile nor aggressive.  Past Psychiatric Medication Trials: History Dr. Krista Blue outpatient [facility] hosp 10/2020 Lorazepam Propranolol Lamotrigine Invega sustaina started Jan 2022 in hospital [facility] Risperidone 6 Abilify 10 NR and wt gain Lithium 1200 in 2020 level 0.8 Trazodone, hydroxyzine, prazosin lithium Depakote 1000 started 12/29/20  Review of Systems:  Review of Systems  Constitutional:  Positive for fatigue and unexpected weight change.  Cardiovascular:  Negative for chest pain and palpitations.  Neurological:  Negative for tremors, weakness and headaches.  Psychiatric/Behavioral:  Negative for hallucinations and suicidal ideas.     Medications: I have reviewed the patient's current medications.  Current Outpatient Medications  Medication Sig Dispense Refill   Cetirizine HCl (ZYRTEC ALLERGY PO) Take by mouth at bedtime. Unsure of dosing. Takes with Benadryl for allergies and sleep.     diphenhydrAMINE (BENADRYL) 25 MG tablet Take 50 mg by mouth at bedtime as needed. Taking for allergies and a sleep aid     hydrOXYzine (ATARAX) 25 MG tablet  TAKE 1-2 TABLETS AT NIGHT FOR SLEEP (Patient taking differently: TAKE 1-2 TABLETS AT NIGHT FOR SLEEP - takes "somewhat") 180 tablet 0   modafinil (PROVIGIL) 200 MG tablet 1/2 table the morning for 1 week then 1 each morning (Patient taking differently: PRN) 30 tablet 1   predniSONE (DELTASONE) 50 MG tablet Take 50 mg by mouth daily with breakfast.     propranolol (INDERAL) 20 MG  tablet TAKE 1-2 TABLETS IN THE AM AS NEEDED FOR WORK STRESS 180 tablet 0   No current facility-administered medications for this visit.    Medication Side Effects: weight, ? Breast enlargment  Allergies: No Known Allergies  Past Medical History:  Diagnosis Date   Migraine     History reviewed. No pertinent family history.  Social History   Socioeconomic History   Marital status: Single    Spouse name: Not on file   Number of children: Not on file   Years of education: Not on file   Highest education level: Not on file  Occupational History   Not on file  Tobacco Use   Smoking status: Light Smoker   Smokeless tobacco: Never  Substance and Sexual Activity   Alcohol use: Not on file   Drug use: Not on file   Sexual activity: Not on file  Other Topics Concern   Not on file  Social History Narrative   Not on file   Social Determinants of Health   Financial Resource Strain: Not on file  Food Insecurity: Not on file  Transportation Needs: Not on file  Physical Activity: Not on file  Stress: Not on file  Social Connections: Not on file  Intimate Partner Violence: Not on file    Past Medical History, Surgical history, Social history, and Family history were reviewed and updated as appropriate.   Fraternal twin brother 2 other brothers  Please see review of systems for further details on the patient's review from today.   Objective:   Physical Exam:  There were no vitals taken for this visit.  Physical Exam Constitutional:      General: He is not in acute distress. Musculoskeletal:         General: No deformity.  Neurological:     Mental Status: He is alert and oriented to person, place, and time.     Coordination: Coordination normal.  Psychiatric:        Attention and Perception: He is attentive. He does not perceive auditory or visual hallucinations.        Mood and Affect: Mood is anxious. Mood is not depressed. Affect is not labile, blunt, angry or inappropriate.        Speech: Speech normal.        Behavior: Behavior normal.        Thought Content: Thought content is not paranoid or delusional. Thought content does not include homicidal or suicidal ideation. Thought content does not include suicidal plan.        Cognition and Memory: Cognition and memory normal.        Judgment: Judgment normal.     Comments: Insight intact Mild paranoia resolved Voices virtually gone.  Some discomfort around people..  No HI or intent. Mild inattention mostly Limited eye contact but  pleasant.     Lab Review:     Component Value Date/Time   NA 138 08/25/2020 1522   K 4.1 08/25/2020 1522   CL 101 08/25/2020 1522   CO2 26 08/25/2020 1522   GLUCOSE 95 08/25/2020 1522   BUN 12 08/25/2020 1522   CREATININE 0.92 08/25/2020 1522   CALCIUM 9.1 08/25/2020 1522   PROT 7.0 08/25/2020 1522   ALBUMIN 4.2 08/25/2020 1522   AST 21 08/25/2020 1522   ALT 17 08/25/2020 1522   ALKPHOS 59 08/25/2020 1522   BILITOT 0.7 08/25/2020 1522   GFRNONAA >60 08/25/2020 1522       Component Value Date/Time  WBC 8.2 08/25/2020 1522   RBC 5.20 08/25/2020 1522   HGB 14.8 08/25/2020 1522   HCT 45.6 08/25/2020 1522   PLT 293 08/25/2020 1522   MCV 87.7 08/25/2020 1522   MCH 28.5 08/25/2020 1522   MCHC 32.5 08/25/2020 1522   RDW 12.1 08/25/2020 1522    No results found for: "POCLITH", "LITHIUM"   No results found for: "PHENYTOIN", "PHENOBARB", "VALPROATE", "CBMZ"   Normal Head CT scan.  They asked whether further neuro work-up was indicated and there does not appear to be focal  neurologic signs or symptoms to suggest that.  Genesight testing disc in detail.  Disc Mother's concerns with his lack of motivation and energy and lack of self direction.  .res Assessment: Plan:    Mubarak was seen today for follow-up and anxiety.  Diagnoses and all orders for this visit:  Schizoaffective disorder, unspecified type (HCC)  Generalized anxiety disorder  Nightmare  Hypersomnolence disorder, with medical condition, persistent, moderate   Greater than 50% of 50 min face to face time with patient and at times with his mother was spent on counseling and coordination of care. We extensively discussed the new diagnosis of schizoaffective disorder with the patient and his mom.  We discussed prognosis.  We discussed factors such as marijuana use which could worsen outcome and is likely to do so.  He still uses on occasion.  This is dangerous.  We discussed the importance of med compliance and how that can change future outcomes with psychotic symptoms as well as relapse risk.  We discussed side effects of antipsychotic medications.  We discussed the trial and error nature of finding the optimal antipsychotic at optimal dose.    Overall his psychotic symptoms are much improved and generally resolved..still neg sx of schizophrenia .  No hostility   Continue counseling with [facility]io Forget  and work on Pharmacist, community and self care   Support groups [facility], [place], NAMI.  Going to [employer] support group by Zoom a couple of times.  Faith based.  Will reduce  [name] every 4 weeks 156 mg q weeks .  We discussed that it can take several months for long-acting injectables to have optimal effect and reach steady state.  He is tolerating the antipsychotic well.  His psychosis is managed but having SE noted.  Will try lower dose and disc risk relapse to redice SE  Noticed enlarged breasts as he left office.  Conisder checking prolactin  Consider alternatives DT SE Vraylar, Latuda,  Caplyta.   Modafinil helped with alertness.   continue modafinil  200 mg daily.  This is less likely to risk relapse of psychosis than would be switching the antipsychotic..  We had an extensive discussion about the current side effects of antipsychotics that he is experiencing and the potential for switch but that is very risky.  Propranolol 20-40 mg before work to deal with anxiety and neg thoughts with stressful coworker.  Disc dietary issues and risk weight gain.  Discussed potential metabolic side effects associated with atypical antipsychotics, as well as potential risk for movement side effects. Advised pt to contact office if movement side effects occur.  Option metformin, he declines   Tobacco use only rarely bc DDI.  Avoid THC.   Encourage exercise and stimulation.  Supportive therapy for pt and family dealing with his new onset psychosis. Encoourage NAMI for mom.  Call if somethings gets worse esp at work.  FU 8 weeks  Meredith Staggers, MD, DFAPA  Please see After Visit Summary for patient specific instructions.  Future Appointments  Date Time Provider Department Center  05/16/2022  4:30 PM CP-NURSE CP-CP None  05/16/2022  5:00 PM Waldron Session, [facility] CP-CP None  06/05/2022  5:00 PM Waldron Session, [facility] CP-CP None  06/18/2022  4:30 PM CP-NURSE CP-CP None  06/18/2022  5:00 PM Waldron Session, [facility] CP-CP None  08/06/2022  4:30 PM Cottle, Steva Ready., MD CP-CP None    No orders of the defined types were placed in this encounter.    -------------------------------

## 2022-05-16 ENCOUNTER — Ambulatory Visit (INDEPENDENT_AMBULATORY_CARE_PROVIDER_SITE_OTHER): Payer: BC Managed Care – PPO

## 2022-05-16 ENCOUNTER — Ambulatory Visit (INDEPENDENT_AMBULATORY_CARE_PROVIDER_SITE_OTHER): Payer: BC Managed Care – PPO | Admitting: Mental Health

## 2022-05-16 DIAGNOSIS — F259 Schizoaffective disorder, unspecified: Secondary | ICD-10-CM | POA: Diagnosis not present

## 2022-05-16 NOTE — Progress Notes (Signed)
NURSE NOTE:   Pt arrived for his monthly injection of [place], his last dose was 04/18/2022. Pt had visit with Dr. Jennelle Human this week and they decided to reduce his dose due to side effects of fatigue, feeling sleepy, weight gain/hungry. His dose today is 156 mg of [place]. Pt received in his left upper gluteal area, tolerated well no complaints.  We discussed signs and symptoms of medication not working as well but it will take about 4-6 weeks until he noticed anything. He doesn't want to mess up his job or daily living but does want to try and decrease dose. Instructed to call if worsening symptoms or further concerns. He was scheduled with [name] so I walked him to his room.    LOT [id number] EXP 09/2023

## 2022-05-16 NOTE — Progress Notes (Signed)
[facility] Psych[facility] Note  Name: Scott Vargas Date: 05/16/2022 MRN: [id number] DOB: 1996-08-05 PCP: [name], MD  Time spent: 54 minutes  Treatment:  individual therapy  Mental Status Exam:    Appearance:    Casual     Behavior:   Appropriate  Motor:   WNL  Speech/Language:    Clear and Coherent  Affect:   Full range   Mood:   Euthymic  Thought process:   normal  Thought content:     WNL  Sensory/Perceptual disturbances:     none  Orientation:   x4  Attention:   Good  Concentration:   Good  Memory:   Intact  Fund of knowledge:    Consistent with age and development  Insight:     Good  Judgment:    Good  Impulse Control:   Good     Reported Symptoms:  anxiety, depression, rumination, avoidant behavior (crowds at times)  Risk Assessment: Danger to Self:  No Self-injurious Behavior: No Danger to Others: No Duty to Warn:no Physical Aggression / Violence:No  Access to Firearms a concern: No  Gang Involvement:No  Patient / guardian was educated about steps to take if suicide or homicide risk level increases between visits: yes While future psychiatric events cannot be accurately predicted, the patient does not currently require acute inpatient psychiatric care and does not currently meet [facility] involuntary commitment criteria.   Diagnoses:    ICD-10-CM   1. Schizoaffective disorder, unspecified type (HCC)  F25.9        Subjective:   Patient presents on time for today's session.  Assessed recent events and progress toward goals.  Patient reports a recent adjustment with his medication with which he is comfortable and agreed to report any concerns to our office if needed.  Identified and shared some stress related to ongoing work issues with one of his fellow employees.  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  He reported this  individual can be difficult to get along with, has realized that others have taken the position with which he is currently in, which appears to have high turnover rate; he feels having to work with this coworker is being the primary reason for the turnover of staff.  Patient identified the need to request a day off tomorrow which is 1/2-day.  Patient expresses concerns about maintaining his current employment due to some comments this coworker made recently.  Provided support.  Encouraged patient to recognize the efforts he has made with this job, which he has been able to detail in previous sessions.  Facilitated his identifying and framing thoughts with which to cope where he was able to share how he plans to be consistent at work and regardless of what occurs over the next few weeks, he will adjust accordingly and seek other employment if that ultimately becomes necessary.  Interventions:   CBT, supportive therapy  Plan: Patient is to use CBT, mindfulness and coping skills to help decrease symptoms associated with their diagnosis.  Patient continues to embrace ways to take steps toward his independence and self acceptance.  Patient to work on being more social, spend time with friends when he is off work.   Long-term goal:   Reduce overall level, frequency, and intensity of the feelings of anxiety in social situations from every occurrence causing anxiety to 1 our of 5 occurrences.  Short-term goal:  Patient to continue to work on identifying thoughts that make his anxiety increased in social situations and to continue to allow himself to engage in social situations to continue to try and work past his anxiety.   Patient to ground himself with rational, calming self talk when having intrusive thoughts. Increased feelings of self-worth by taking steps toward achieving more independence by finding and maintaining employment    Assessment of progress:  progressing   This record has been  created using [employer].  Chart creation errors have been sought, but may not always have been located and corrected. Such creation errors do not reflect on the standard of medical care.     Waldron Session, [facility]

## 2022-06-05 ENCOUNTER — Ambulatory Visit (INDEPENDENT_AMBULATORY_CARE_PROVIDER_SITE_OTHER): Payer: BC Managed Care – PPO | Admitting: Mental Health

## 2022-06-05 DIAGNOSIS — F259 Schizoaffective disorder, unspecified: Secondary | ICD-10-CM | POA: Diagnosis not present

## 2022-06-05 NOTE — Progress Notes (Signed)
[facility] Psychotherapy Note  Name: Scott Vargas Date: 06/05/2022 MRN: [id number] DOB: 05/08/1996 PCP: [name], MD  Time spent: 51 minutes  Treatment:  individual therapy  Mental Status Exam:    Appearance:    Casual     Behavior:   Appropriate  Motor:   WNL  Speech/Language:    Clear and Coherent  Affect:   Full range   Mood:   Euthymic  Thought process:   normal  Thought content:     WNL  Sensory/Perceptual disturbances:     none  Orientation:   x4  Attention:   Good  Concentration:   Good  Memory:   Intact  Fund of knowledge:    Consistent with age and development  Insight:     Good  Judgment:    Good  Impulse Control:   Good     Reported Symptoms:  anxiety, depression, rumination, avoidant behavior (crowds at times)  Risk Assessment: Danger to Self:  No Self-injurious Behavior: No Danger to Others: No Duty to Warn:no Physical Aggression / Violence:No  Access to Firearms a concern: No  Gang Involvement:No  Patient / guardian was educated about steps to take if suicide or homicide risk level increases between visits: yes While future psychiatric events cannot be accurately predicted, the patient does not currently require acute inpatient psychiatric care and does not currently meet [facility] involuntary commitment criteria.   Diagnoses:    ICD-10-CM   1. Schizoaffective disorder, unspecified type (HCC)  F25.9         Subjective:   Patient presents on time for today's session.  Facilitated patient sharing recent events in progress.  Patient stated that he has worked to focus on his accomplishments at his current job and reported some improvements.  Patient identified how he feels that he will be able to keep this job but will be okay either way.  Patient went on to share how is trying to be effective and handling situations with one of his coworkers that has been challenging.  Other concerns related to family were expressed, centered around  his parents relationship.  Patient expressed optimism that they could work through issues and was able to identify some of his own feelings related.  Facilitated patient identifying how he plans to specifically handle situations at work, cope and care for himself by getting adequate rest and explored peer relationships.  Interventions:   CBT, supportive therapy  Plan: Patient is to use CBT, mindfulness and coping skills to help decrease symptoms associated with their diagnosis.  Patient continues to embrace ways to take steps toward his independence and self acceptance.  Patient to work on being more social, spend time with friends when he is off work.   Long-term goal:   Reduce overall level, frequency, and intensity of the feelings of anxiety in social situations from every occurrence causing anxiety to 1 our of 5 occurrences.       Short-term goal:  Patient to continue to work on identifying thoughts that make his anxiety increased in social situations and to continue to allow himself to engage in social situations to continue to try and work past his anxiety.   Patient to ground himself with rational, calming self talk when having intrusive thoughts. Increased feelings of self-worth by taking steps toward achieving more independence by finding and maintaining employment    Assessment of progress:  progressing   This record has been created using [employer].  Chart creation errors have been sought,  but may not always have been located and corrected. Such creation errors do not reflect on the standard of medical care.     Waldron Session, [facility]

## 2022-06-13 DIAGNOSIS — F2081 Schizophreniform disorder: Secondary | ICD-10-CM | POA: Diagnosis not present

## 2022-06-13 DIAGNOSIS — I10 Essential (primary) hypertension: Secondary | ICD-10-CM | POA: Diagnosis not present

## 2022-06-13 DIAGNOSIS — F5101 Primary insomnia: Secondary | ICD-10-CM | POA: Diagnosis not present

## 2022-06-18 ENCOUNTER — Ambulatory Visit (INDEPENDENT_AMBULATORY_CARE_PROVIDER_SITE_OTHER): Payer: BC Managed Care – PPO

## 2022-06-18 ENCOUNTER — Ambulatory Visit: Payer: BC Managed Care – PPO | Admitting: Mental Health

## 2022-06-18 DIAGNOSIS — F259 Schizoaffective disorder, unspecified: Secondary | ICD-10-CM

## 2022-06-21 NOTE — Progress Notes (Signed)
NURSE NOTE:     Pt arrived for his monthly injection of [place], his last dose was 05/16/2022. Pt received 156 mg of [place]. Pt received in his left upper gluteal area, tolerated well no complaints.  Instructed to call if worsening symptoms or further concerns. He is scheduled back in another 4 weeks, reports no symptoms      LOT [id number] EXP 10-2023

## 2022-07-11 ENCOUNTER — Ambulatory Visit (INDEPENDENT_AMBULATORY_CARE_PROVIDER_SITE_OTHER): Payer: BC Managed Care – PPO | Admitting: Mental Health

## 2022-07-11 DIAGNOSIS — F259 Schizoaffective disorder, unspecified: Secondary | ICD-10-CM | POA: Diagnosis not present

## 2022-07-11 NOTE — Progress Notes (Signed)
[facility] Psychotherapy Note  Name: Scott Vargas Date: 07/11/2022 MRN: [id number] DOB: [birth year] PCP: [name], MD  Time spent: 45 minutes  Treatment:  individual therapy  Mental Status Exam:    Appearance:    Casual     Behavior:   Appropriate  Motor:   WNL  Speech/Language:    Clear and Coherent  Affect:   Full range   Mood:   Euthymic  Thought process:   normal  Thought content:     WNL  Sensory/Perceptual disturbances:     none  Orientation:   x4  Attention:   Good  Concentration:   Good  Memory:   Intact  Fund of knowledge:    Consistent with age and development  Insight:     Good  Judgment:    Good  Impulse Control:   Good     Reported Symptoms:  anxiety, depression, rumination, avoidant behavior (crowds at times)  Risk Assessment: Danger to Self:  No Self-injurious Behavior: No Danger to Others: No Duty to Warn:no Physical Aggression / Violence:No  Access to Firearms a concern: No  Gang Involvement:No  Patient / guardian was educated about steps to take if suicide or homicide risk level increases between visits: yes While future psychiatric events cannot be accurately predicted, the patient does not currently require acute inpatient psychiatric care and does not currently meet [facility] involuntary commitment criteria.   Diagnoses:    ICD-10-CM   1. Schizoaffective disorder, unspecified type (HCC)  F25.9          Subjective:   Patient presents on time for today's session.  Assess progress and events since last visit which was about 1 month ago.  Patient shared how he has had some positive changes at work, going on to share how   Assessed recent events and progress toward goals.  Patient reports a recent adjustment with his medication with which he is comfortable and agreed to report any concerns to our office if needed.  Identified and shared some stress related to ongoing work issues with one of his fellow employees.  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  He reported this individual can be difficult to get along with, has realized that others have taken the position with which he is currently in, which appears to have high turnover rate; he feels having to work with this coworker is being the primary reason for the turnover of staff.  Patient identified the need to request a day off tomorrow which is 1/2-day.  Patient expresses concerns about maintaining his current employment due to some comments this coworker made recently.  Provided support.  Encouraged patient to recognize the efforts he has made with this job, which he has been able to detail in previous sessions.  Facilitated his identifying and framing thoughts with which to cope where he was able to share how he plans to be consistent at work and regardless of what occurs over the next few weeks, he will adjust accordingly and seek other employment if that ultimately becomes necessary.  Interventions:   CBT, supportive therapy  Plan: Patient is to use CBT, mindfulness and coping skills to help decrease symptoms associated with their diagnosis.  Patient continues to embrace ways to take steps toward his independence and self acceptance.  Patient to work on being more social, spend time with friends when he is  off work.   Long-term goal:   Reduce overall level, frequency, and intensity of the feelings of anxiety in social situations from every occurrence causing anxiety to 1 our of 5 occurrences.       Short-term goal:  Patient to continue to work on identifying thoughts that make his anxiety increased in social situations and to continue to allow himself to engage in social situations to continue to try and work past his anxiety.   Patient to ground himself with rational, calming self talk when having intrusive thoughts. Increased feelings of self-worth by  taking steps toward achieving more independence by finding and maintaining employment    Assessment of progress:  progressing   This record has been created using [employer].  Chart creation errors have been sought, but may not always have been located and corrected. Such creation errors do not reflect on the standard of medical care.     Waldron Session, [facility]

## 2022-07-24 ENCOUNTER — Ambulatory Visit (INDEPENDENT_AMBULATORY_CARE_PROVIDER_SITE_OTHER): Payer: BC Managed Care – PPO | Admitting: Mental Health

## 2022-07-24 ENCOUNTER — Ambulatory Visit (INDEPENDENT_AMBULATORY_CARE_PROVIDER_SITE_OTHER): Payer: BC Managed Care – PPO

## 2022-07-24 DIAGNOSIS — F259 Schizoaffective disorder, unspecified: Secondary | ICD-10-CM | POA: Diagnosis not present

## 2022-07-24 NOTE — Progress Notes (Signed)
[facility] Psychotherapy Note  Name: Scott Vargas Date: 07/24/2022 MRN: [id number] DOB: [birth year] PCP: [name], MD  Time spent: 48 minutes  Treatment:  individual therapy  Mental Status Exam:    Appearance:    Casual     Behavior:   Appropriate  Motor:   WNL  Speech/Language:    Clear and Coherent  Affect:   Full range   Mood:   Euthymic  Thought process:   normal  Thought content:     WNL  Sensory/Perceptual disturbances:     none  Orientation:   x4  Attention:   Good  Concentration:   Good  Memory:   Intact  Fund of knowledge:    Consistent with age and development  Insight:     Good  Judgment:    Good  Impulse Control:   Good     Reported Symptoms:  anxiety, depression, rumination, avoidant behavior (crowds at times)  Risk Assessment: Danger to Self:  No Self-injurious Behavior: No Danger to Others: No Duty to Warn:no Physical Aggression / Violence:No  Access to Firearms a concern: No  Gang Involvement:No  Patient / guardian was educated about steps to take if suicide or homicide risk level increases between visits: yes While future psychiatric events cannot be accurately predicted, the patient does not currently require acute inpatient psychiatric care and does not currently meet [facility] involuntary commitment criteria.   Diagnoses:    ICD-10-CM   1. Schizoaffective disorder, unspecified type (HCC)  F25.9         Subjective:   Patient presents on time for today's session.  Patient shared recent events where he stated he had more stress at work recently.  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  He stated that he was questioned about communicating with students, specifically any attempts to solicit dates from girls.  Patient stated that he situation now has been resolved although he stated it caused considerable stress and anxiety at the time.  Patient shared  how he analyzed any communications he had with students as he has been working football games on the weekends.  He stated that he cannot recall any comments or conversations that could be construed as his asking out students, denies asking for any phone numbers but did have conversations with various students that he is around while working which is typical and often during the football games.  At this point patient identified the need to have very little to no conversations with students as to not being misconstrued.  He shared how overall, 1 relationship with one of his coworkers that has been stressful has improved however, patient stated that he wants to feel less self-critical and more confident around this person.  We facilitated his further processing thoughts and feelings related with a focus on identifying self supportive thoughts specifically with which to remind himself of versus how he is made to feel by this individual.   Interventions:   CBT, supportive therapy  Plan: Patient is to use CBT, mindfulness and coping skills to help decrease symptoms associated with their diagnosis.  Patient continues to embrace ways to take steps toward his independence and self acceptance.  Patient to work on being more social, spend time with friends when he is off work.   Long-term goal:   Reduce overall level, frequency, and intensity of the feelings of anxiety in social situations from every occurrence causing anxiety  to 1 our of 5 occurrences.       Short-term goal:  Patient to continue to work on identifying thoughts that make his anxiety increased in social situations and to continue to allow himself to engage in social situations to continue to try and work past his anxiety.   Patient to ground himself with rational, calming self talk when having intrusive thoughts. Increased feelings of self-worth by taking steps toward achieving more independence by finding and maintaining  employment    Assessment of progress:  progressing   This record has been created using [employer].  Chart creation errors have been sought, but may not always have been located and corrected. Such creation errors do not reflect on the standard of medical care.     [name], [facility]

## 2022-07-24 NOTE — Progress Notes (Signed)
NURSE NOTE:     Pt arrived for his monthly injection of [place], his last dose was 06/18/2022. Pt received 156 mg of [place]. Pt received in his left upper gluteal area, tolerated well no complaints. Pt very pleasant. Instructed to call if worsening symptoms or further concerns. He is scheduled back in another 4 weeks, reports no symptoms. Had apt with Gerald Stabs after injection.     LOT [id number] EXP 11-2023

## 2022-08-06 ENCOUNTER — Ambulatory Visit: Payer: BC Managed Care – PPO | Admitting: Psychiatry

## 2022-08-07 ENCOUNTER — Ambulatory Visit (INDEPENDENT_AMBULATORY_CARE_PROVIDER_SITE_OTHER): Payer: BC Managed Care – PPO | Admitting: Mental Health

## 2022-08-07 DIAGNOSIS — F259 Schizoaffective disorder, unspecified: Secondary | ICD-10-CM | POA: Diagnosis not present

## 2022-08-07 NOTE — Progress Notes (Signed)
[facility] Psychotherapy Note  Name: Scott Vargas Date: 08/08/2022 MRN: [id number] DOB: 1996/10/20 PCP: [name], MD  Time spent: 48 minutes  Treatment:  individual therapy  Mental Status Exam:    Appearance:    Casual     Behavior:   Appropriate  Motor:   WNL  Speech/Language:    Clear and Coherent  Affect:   Full range   Mood:   Euthymic  Thought process:   normal  Thought content:     WNL  Sensory/Perceptual disturbances:     none  Orientation:   x4  Attention:   Good  Concentration:   Good  Memory:   Intact  Fund of knowledge:    Consistent with age and development  Insight:     Good  Judgment:    Good  Impulse Control:   Good     Reported Symptoms:  anxiety, depression, rumination, avoidant behavior (crowds at times)  Risk Assessment: Danger to Self:  No Self-injurious Behavior: No Danger to Others: No Duty to Warn:no Physical Aggression / Violence:No  Access to Firearms a concern: No  Gang Involvement:No  Patient / guardian was educated about steps to take if suicide or homicide risk level increases between visits: yes While future psychiatric events cannot be accurately predicted, the patient does not currently require acute inpatient psychiatric care and does not currently meet [facility] involuntary commitment criteria.   Diagnoses:    ICD-10-CM   1. Schizoaffective disorder, unspecified type (HCC)  F25.9          Subjective:   Patient presents on time for today's session.  Assessed progress since last visit where he stated that he has had less work-related stress, getting along well with 1 coworker who is historically caused stress at work for patient.  He shared how he would like to possibly decrease his medication dosage or try a new medication due to often feeling a sense of chronic fatigue most days, unable to lose unwanted weight and generally not feeling energetic or is motivated as he would like.  He reports having some "  invasive thoughts", denies any auditory hallucinations denies hearing any voices and any other positive symptoms.  Assessed peer relationships, some increased steps recently where patient was able to spend time with a few friends over the past weekend.  It has been a few months since patient has been as social.  Also, trying to start dating again has been on recent dating apps and plans to meet up with a someone next week.  Explored family relationships where he stated that he engaged in a family therapy session in support of his parents who are currently in couples counseling.  Time was spent to allow him to process some feelings related to the concerns he has about his parent's marriage.  He verbalizes a sense of pride and accomplishment in maintaining his current job, at times feels working full-time is somewhat overwhelming largely due to his feeling fatigued often.  Facilitated his identifying feelings, needs while providing support throughout.     Interventions:   CBT, supportive therapy  Plan: Patient is to use CBT, mindfulness and coping skills to help decrease symptoms associated with their diagnosis.  Patient continues to embrace ways to take steps toward his independence and self acceptance.  Patient to work on being more social, spend time with friends when he is off work.   Long-term goal:   Reduce overall level, frequency, and intensity of the feelings of anxiety in  social situations from every occurrence causing anxiety to 1 our of 5 occurrences.       Short-term goal:  Patient to continue to work on identifying thoughts that make his anxiety increased in social situations and to continue to allow himself to engage in social situations to continue to try and work past his anxiety.   Patient to ground himself with rational, calming self talk when having intrusive thoughts. Increased feelings of self-worth by taking steps toward achieving more independence by finding and maintaining  employment    Assessment of progress:  progressing   This record has been created using [employer].  Chart creation errors have been sought, but may not always have been located and corrected. Such creation errors do not reflect on the standard of medical care.     [name], [facility]

## 2022-08-21 ENCOUNTER — Ambulatory Visit: Payer: BC Managed Care – PPO | Admitting: Mental Health

## 2022-08-22 ENCOUNTER — Ambulatory Visit (INDEPENDENT_AMBULATORY_CARE_PROVIDER_SITE_OTHER): Payer: Self-pay

## 2022-08-22 DIAGNOSIS — F259 Schizoaffective disorder, unspecified: Secondary | ICD-10-CM

## 2022-08-23 ENCOUNTER — Telehealth: Payer: Self-pay

## 2022-08-23 NOTE — Telephone Encounter (Signed)
Scott Vargas came in yesterday 08/22/2022 and received a sample supply of Invega Sustenna 156 mg/ml to left upper buttocks IM, tolerated well. Informed him I would discuss with Dr. Clovis Pu and get him scheduled to see him asap.   Pt quit his job on Monday, October 29NL due to a conflict with a co-worker. Pt reports he does have another job, which is a work from home position he will be starting second week in November I believe. He is picking out new insurance that his Dad will pay for. Pt was asking to change medication prior to his issue with work due to weight gain. Pt has been on the lower dose of Invega 156 mg since July and needs follow up with Dr. Clovis Pu now as well to see how he's doing.

## 2022-08-23 NOTE — Telephone Encounter (Signed)
Noted.  We should not make any further med changes until he's seen.  He has an appointment with me in about 5 weeks.  We could consider some change if he wanted to see either Aaron Edelman or Anderson Malta, the nurse practitioners, and then I could discuss the case with them.  But if he wants to see me to make the decision he will need to keep his appointment with me and we will go from there at that point

## 2022-08-26 NOTE — Progress Notes (Signed)
NURSE NOTE:    Pt had contacted office earlier in the week reporting he had quit his job and no longer had insurance to cover his injection. Informed pt he could still come get his injection that office had a sample supply of his [place]. He agreed and was pleased to be able to continue taking for another month.  Pt arrived for his monthly injection of [place], his last dose was 07/24/2022. Pt received 156 mg of [place]. Pt received in his left upper gluteal area, tolerated well no complaints. Pt very pleasant. Instructed to call if worsening symptoms or further concerns. He is scheduled back in another 4 weeks, reports no symptoms. Informed pt I would discuss with Dr. Clovis Pu the plan with his medication, he had already wanted to come off of his Invega and change medications due to weight gain.    LOT [id number] EXP 12-2023

## 2022-09-04 ENCOUNTER — Ambulatory Visit (INDEPENDENT_AMBULATORY_CARE_PROVIDER_SITE_OTHER): Payer: BC Managed Care – PPO | Admitting: Mental Health

## 2022-09-04 DIAGNOSIS — F259 Schizoaffective disorder, unspecified: Secondary | ICD-10-CM | POA: Diagnosis not present

## 2022-09-04 NOTE — Progress Notes (Signed)
[facility] Psychotherapy Note  Name: Scott Vargas Date: 09/04/2022 MRN: [id number] DOB: June 17, 1996 PCP: [name], MD  Time spent: 47 minutes  Treatment:  individual therapy  Mental Status Exam:    Appearance:    Casual     Behavior:   Appropriate  Motor:   WNL  Speech/Language:    Clear and Coherent  Affect:   Full range   Mood:   Euthymic  Thought process:   normal  Thought content:     WNL  Sensory/Perceptual disturbances:     none  Orientation:   x4  Attention:   Good  Concentration:   Good  Memory:   Intact  Fund of knowledge:    Consistent with age and development  Insight:     Good  Judgment:    Good  Impulse Control:   Good     Reported Symptoms:  anxiety, depression, rumination, avoidant behavior (crowds at times)  Risk Assessment: Danger to Self:  No Self-injurious Behavior: No Danger to Others: No Duty to Warn:no Physical Aggression / Violence:No  Access to Firearms a concern: No  Gang Involvement:No  Patient / guardian was educated about steps to take if suicide or homicide risk level increases between visits: yes While future psychiatric events cannot be accurately predicted, the patient does not currently require acute inpatient psychiatric care and does not currently meet [facility] involuntary commitment criteria.   Diagnoses:    ICD-10-CM   1. Schizoaffective disorder, unspecified type (HCC)  F25.9       Subjective:   Patient presents on time for today's session.    Interventions:   CBT, supportive therapy  Plan: Patient is to use CBT, mindfulness and coping skills to help decrease symptoms associated with their diagnosis.  Patient continues to embrace ways to take steps toward his independence and self acceptance.  Patient to work on being more social, spend time with friends when he is off work.   Long-term goal:   Reduce overall level, frequency, and intensity of the feelings of anxiety in social situations from every  occurrence causing anxiety to 1 our of 5 occurrences.       Short-term goal:  Patient to continue to work on identifying thoughts that make his anxiety increased in social situations and to continue to allow himself to engage in social situations to continue to try and work past his anxiety.   Patient to ground himself with rational, calming self talk when having intrusive thoughts. Increased feelings of self-worth by taking steps toward achieving more independence by finding and maintaining employment    Assessment of progress:  progressing   This record has been created using [employer].  Chart creation errors have been sought, but may not always have been located and corrected. Such creation errors do not reflect on the standard of medical care.     Waldron Session, [facility]

## 2022-09-18 ENCOUNTER — Ambulatory Visit (INDEPENDENT_AMBULATORY_CARE_PROVIDER_SITE_OTHER): Payer: BC Managed Care – PPO

## 2022-09-18 ENCOUNTER — Ambulatory Visit (INDEPENDENT_AMBULATORY_CARE_PROVIDER_SITE_OTHER): Payer: BC Managed Care – PPO | Admitting: Mental Health

## 2022-09-18 ENCOUNTER — Ambulatory Visit: Payer: BC Managed Care – PPO | Admitting: Psychiatry

## 2022-09-18 DIAGNOSIS — F259 Schizoaffective disorder, unspecified: Secondary | ICD-10-CM

## 2022-09-18 NOTE — Progress Notes (Signed)
NURSES NOTE:   Pt arrived for his monthly injection of Invega Sustenna 156 mg/ml, he received in his left upper gluteal area. He tolerated well and said it didn't hurt today. His last dose was on 08/22/2022. Pt did bring his own injection today filled at [facility], but sounds like his co-pay was very high. Informed him we needed to get a co-pay card for him and he reports they did look into patient assistance with [employer]. He reports his Dad is paying his insurance currently since he's in between jobs. It is with [employer] at this time.  Pt sees Dr. Jennelle Human in 2 weeks and wants to discuss options for a cheaper medication. Informed him we would wait to download a co-pay card until Dr. Jennelle Human reviews things with him.    LOT [id number]  EXP 01/2024

## 2022-09-18 NOTE — Progress Notes (Signed)
[facility] Psychotherapy Note  Name: Kaedan Richert Date: 09/18/2022 MRN: [id number] DOB: [birth year] PCP: [name], MD  Time spent: 49 minutes  Treatment:  individual therapy  Mental Status Exam:    Appearance:    Casual     Behavior:   Appropriate  Motor:   WNL  Speech/Language:    Clear and Coherent  Affect:   Full range   Mood:   Euthymic  Thought process:   normal  Thought content:     WNL  Sensory/Perceptual disturbances:     none  Orientation:   x4  Attention:   Good  Concentration:   Good  Memory:   Intact  Fund of knowledge:    Consistent with age and development  Insight:     Good  Judgment:    Good  Impulse Control:   Good     Reported Symptoms:  anxiety, depression, rumination, avoidant behavior (crowds at times)  Risk Assessment: Danger to Self:  No Self-injurious Behavior: No Danger to Others: No Duty to Warn:no Physical Aggression / Violence:No  Access to Firearms a concern: No  Gang Involvement:No  Patient / guardian was educated about steps to take if suicide or homicide risk level increases between visits: yes While future psychiatric events cannot be accurately predicted, the patient does not currently require acute inpatient psychiatric care and does not currently meet [facility] involuntary commitment criteria.   Diagnoses:  No diagnosis found.   Subjective:   Patient presents on time for today's session.  Patient presented without any signs of distress. He reported experiencing financial strain due to the costs associated with his insurance. Despite this, he receives financial support from his parents while residing with them. 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 He is contemplating obtaining certification and is exploring the possibility of part-time to full-time employment while undergoing schooling. The patient is experiencing anxiety and stress,  particularly in the context of interactions with his girlfriend's parents. He has established boundaries to avoid meeting at a specific church, which he associates with past negative experiences that were linked to the early symptoms of his psychosis. The patient has a history of psychiatric symptoms that necessitated inpatient psychiatric care. During the consultation, he discussed strategies for managing anxiety and the importance of assertive communication within his relationship. Through guided discovery, the patient acknowledged a degree of denial regarding his diagnosis but also recognized that there were unexplainable events that impacted his functioning prior to the initiation of medication therapy.  Interventions:   Motivational interviewing, supportive therapy  Plan: Patient is to use CBT, mindfulness and coping skills to help decrease symptoms associated with their diagnosis.  Patient continues to embrace ways to take steps toward his independence and self acceptance.    Long-term goal:   Reduce overall level, frequency, and intensity of the feelings of anxiety in social situations from every occurrence causing anxiety to 1 our of 5 occurrences.       Short-term goal:  Patient to continue to work on identifying thoughts that make his anxiety increased in social situations and to continue to allow himself to engage in social situations to continue to try and work past his anxiety.   Patient to ground himself with rational, calming self talk when having intrusive thoughts. Increased feelings of self-worth by taking steps toward achieving more independence by finding and maintaining employment    Assessment of progress:  progressing   This record has  been created using [employer].  Chart creation errors have been sought, but may not always have been located and corrected. Such creation errors do not reflect on the standard of medical care.     Waldron Session, [facility]

## 2022-10-02 ENCOUNTER — Encounter: Payer: Self-pay | Admitting: Psychiatry

## 2022-10-02 ENCOUNTER — Ambulatory Visit (INDEPENDENT_AMBULATORY_CARE_PROVIDER_SITE_OTHER): Payer: BC Managed Care – PPO | Admitting: Psychiatry

## 2022-10-02 DIAGNOSIS — F259 Schizoaffective disorder, unspecified: Secondary | ICD-10-CM | POA: Diagnosis not present

## 2022-10-02 DIAGNOSIS — F411 Generalized anxiety disorder: Secondary | ICD-10-CM | POA: Diagnosis not present

## 2022-10-02 NOTE — Progress Notes (Signed)
Scott Vargas [id number] 1996/06/22 26 y.o.  Subjective:   Patient ID:  Scott Vargas is a 26 y.o. (DOB [birth year]) male.  Chief Complaint:  Chief Complaint  Patient presents with   Follow-up    Schizoaffective disorder, unspecified type (HCC)   Anxiety    HPI Scott Vargas presents to the office today for follow-up of schizoaffective disorder with recent hospitalizations. First seen in our office 12/29/2020.  His symptoms were improved but not resolved and included depression, paranoia, intrusive violent images, chronic suicidal thoughts. He was just out of the hospital so no medications were in changed which included Invega IM every 4 weeks, risperidone 2 mg nightly, prazosin 2 mg nightly, trazodone 100 mg nightly, hydroxyzine 50 mg 3 times daily as needed  01/12/2021 appointment with the following noted: Working and going pretty smooth.  Scott Vargas out with friends and it went better than I thought. 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   No pot use since here. Mood pretty chill. Still gets voices negative and positive like "people don't like you".  Voices come and go.  Dealt with it a long time but never told anyone.  Better if busy.   Not sure about character of voices.  They come to me.  Worse when not busy and any time of day.  Voices are frustrating.  Voices got worse July 2020 in sprittual experience.  Not depressed.  Pleased friends accepted him better than expected. Attending young adults group at [facility] which is more centered spiriitually.   Still seeing therapist. Some people bother him but not sure why.  Trying to make new friends. In [place] got 2 restraining orders from 2 different churches.  No outstanding charges. Alcohol 2 -3 per week. Started Depakote without problems.  Pretty well with anger. Sleep OK with mixed type.  NM awaken him once weekly. No SE.  Then with parents Scott Vargas & dad Not as depressed as he was. M & F notes increase anxiety as got closer to  injection date.  F sees it's "night and day"  Difference and I have my son back. M agrees he's exhibited bravery in doing difficult things. Last injection date March 12- day outpatient [facility] [place] [place].  Plan: Get next Scott Vargas 1 week early by 1 week for a couple of mos DT observed wearing off.  03/07/2021 appointment with the following noted:  Alone and with Mother Last received Invega Sustenna 234 mg per 1.5 mL injection on 02/26/2021.  Last 2 injections have it 3-week intervals instead of typical 4-week intervals bc of ongoing psychotic sx. Doing well and exercising and biking.  Doing ok with voices.  Still hear them somewhat but less prominent and ignoring them more.  Mood pretty calm.  Not lately much fear or anxiety.  Maybe a little better with small talk with people. 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 No problems with meds.  Sometimes lower motivation.    Less hydroxyzine 1/week.   Dreams good and bad still.  Rarely will be awakened by them. No anger outbursts. Mild paranoia out in public about what people are thinking. With Mom:  Superb cp to 2 mos ago.  Some days lacks motivation.  Better on days he works. Plan: Trial L-carnitine 1000 mg 1 twice daily to see if energy and focus are better. Change Invega Sustaina back to every 4 weeks at 234mg  q weeks   05/31/2021 appt noted: Called a couple days ago complaining of more anxiety. Lately with job search  and mostly unemployed has a lot of free time and not feeling good about himself.  Looking for job through [place]. Sees GF regularly in [place].  Likes the summer. When in groups or around strangers has anxiety but it is improved over the past.  Does not feel as paranoid.  But will feel people are looking at him too much.  Causes anxiety but not anger.  Makes me not want to be around people.Takes hydroxyzine with anxiety and it helps. AH "not so bad".  Dreams can be scary or disturbing.   My mind was hijacked several  months ago.  Meds leveled me out.   Taking trazodone at night for sleep.  Tolerating meds except weight gain from 170 to 210# over the year.  Hungry all the time. Risperidone has not bee taken. A/P: overall psychotic sx improved not resolved hydroxyzine instead of trazodone for sleep to see if dreams are better. Continue Scott Vargas back to every 4 weeks at 234mg  q weeks  Add risperidone 1/2 tablet nightly for 1-2 weeks and if no improvement in paranoia and anxiety then increase to 1 nightly  08/13/21 appt noted: alone and with mother Usually sleep good.  Rare NM. Better comfort around people. Went to [employer]. No paranoia.  AH less bothersome. Mainly evident intermittently.  Better than last visit. No depression. SE none except weight.   Energy still kind of low. 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 M sees a lot of improvement this year. Scott Vargas done well with counseling per pt and M.  She's concerned about his sleep about 12 hour and longer if nothing scheduled.   He says he doesn't have much to do & lacks motivation to exercise as in past.   Plan:  Continue hydroxyzine instead of trazodone for sleep bc dreams are better. Continue Invega Sustaina back to every 4 weeks at 234mg  q weeks DT observed wearing off.   Continue risperidone 1 mg nightly bc paranoia and voices are better.  10/18/21 appt noted: alone and with father Stopped all tablets including Depakote, risperidone bc didn't think he needed them. Thinks he needs hydroxyzine 50 hs for sleep. No violent thoughts off Depakote.   AH about the same or better off risperidone. Se with them he thinks he felt a little question.  Energy is better right now.  Not needing to nap in afternoon.  Still dreams a lot.  NM once weekly.  Rather not take the prazosin right now..   A little social avoidance ongoing but when he does seems ok. Disappointed he didn't get job at [employer].    Not currently working anywhere. No problems  with shot except sore for a wek after the shot.   Is video gaming again some now and wasn't for awhile.  Including complex games. F says invega has been a miracle drug. Plan: Continue Scott Vargas back to every 4 weeks at 234mg  q weeks  OK to stop risperidone id not needed for voices.  01/16/2022 appointment with the following noted: Sleep ok off hyrdroxyzine and prazosin without NM. Sleep 10 hours.   No risperidone. No voices but some intrusive thoughts that are not too bad. New job helps his negative thoughts.  Able to manage anger.  But can have thoughts of hurting others that provoke him couple times per week. No paranoid or fearful thoughts.  No acting ou tof anger.   Greater sense of purpose now that FT job.   No depression. Consistent with scheduled  injection.   Plan: No med changes  03/18/22 appt noted: Has been off risperidone since December 2022. Still hear the voices but not focusing on them.  Comes and goes and more when quiet and when busy not as much.  Voices remind him of things and can be positive or negative.  Reminded frequently of relationship struggles and how his mental health affected his relationships.   Not socially active. Busy most of the time with work.  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  Works for [employer].  Scott Vargas is great. No longer has guns.  At workplace does have coworker who yells hat him but learning to tolerate him. Propranolol helps him deal with this at work. Getting counseling through [facility].   Sleeps 11-12 hours Weight gain 220.  2 years ago was 170# before psychosis and meds. CO SE weight gain, fatigue, sleepiness with Invega sustena and asksabout alternatives. Asks if he might eventually be able to stop antipsychotic. Plan: start modafinil 100 mg every morning for 1 week and then 200 mg daily for sleepiness.  Continue antipsychotic  05/13/22 appt noted: Has been consistent with monthly Invega Sustenna 234  mg per 1.5 mL IM SE wt gain and sleepy.  Hungry. Thinks invega dulled excitement to mountain bike and doesn't do it. Started prednisone today for poison ivy. Used 1/2 bottle modafinil and more alert at work.  Able to stay up later.  Uses it prn.  SE HA initially resolved. Sometimes trouble falling asleep.  Still vivid dreams and some NM.  Themes violent or sexual. It really affected him before the hsopital bc thought they were reality.    Now can tell the difference. Work performance affected by weight.  But still doing ok with work. Voices not very noticeable usually.   No sig paranoia but socially isolated and content about that.  Realizes it's not great long term.  Social interaction at work.  Was not confident talking to servers at restaurants and that's fine now. Aware he has trouble making eye contact with people. No longer feels hostile nor aggressive. Plan: Will reduce  Scott Vargas every 4 weeks 156 mg q weeks .    08/23/22 noted from nurse: Scott Vargas came in yesterday 08/22/2022 and received a sample supply of Invega Sustenna 156 mg/ml to left upper buttocks IM, tolerated well. Informed him I would discuss with Dr. Clovis Vargas and get him scheduled to see him asap.    Pt quit his job on Monday, October [id number] due to a conflict with a co-worker. Pt reports he does have another job, which is a work from home position he will be starting second week in November I believe. He is picking out new insurance that his Dad will pay for. Pt was asking to change medication prior to his issue with work due to weight gain. Pt has been on the lower dose of Invega 156 mg since July and needs follow up with Dr. Clovis Vargas now as well to see how he's doing    MD response:  Noted.  We should not make any further med changes until he's seen.  He has an appointment with me in about 5 weeks.  We could consider some change if he wanted to see either Aaron Edelman or Anderson Malta, the nurse practitioners, and then I could discuss  the case with them.  But if he wants to see me to make the decision he will need to keep his appointment with me and we will  go from there at that point      10/02/22 appt noted: also with father A little less hunger cravings and a little better energy with less Invega. Felt coworker was harassing him and felt he needed to quit. Was doing well until that week.  Had just gotten a GF at the time he quit.  If married with kids wouldn't have quit but he felt it was a no where job. 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  Has been accepted to the [facility] program. 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  Got financial aid for free school. Lost health insurance with loss of job.  Concerns for cost of Invega.  Applying for pt assistance. Wants firearm for hunting and self defense and target shooting.  Has had a concealed carry permit.  Has had guns for years before. No problems with meds and concerns with meds. No depression and anxiety, nor paranoia, nor agitation.   To bed 9 to 830 AM.  Needs that much sleep right now.    Past Psychiatric Medication Trials: History Dr. Annell Greening outpatient [facility] hosp 10/2020 Lorazepam Propranolol Lamotrigine Invega sustaina started Jan 2022 in hospital [facility] Risperidone 6 Abilify 10 NR and wt gain Lithium 1200 in 2020 level 0.8 Trazodone, hydroxyzine, prazosin lithium Depakote 1000 started 12/29/20  Review of Systems:  Review of Systems  Constitutional:  Positive for fatigue and unexpected weight change.  Cardiovascular:  Negative for chest pain and palpitations.  Neurological:  Negative for tremors, weakness and headaches.  Psychiatric/Behavioral:  Negative for hallucinations and suicidal ideas.     Medications: I have reviewed the patient's current medications.  Current Outpatient Medications  Medication Sig Dispense Refill   Cetirizine HCl (ZYRTEC ALLERGY PO) Take by mouth at bedtime. Unsure of dosing. Takes with Benadryl for allergies and sleep.      diphenhydrAMINE (BENADRYL) 25 MG tablet Take 50 mg by mouth at bedtime as needed. Taking for allergies and a sleep aid     hydrOXYzine (ATARAX) 25 MG tablet TAKE 1-2 TABLETS AT NIGHT FOR SLEEP (Patient taking differently: TAKE 1-2 TABLETS AT NIGHT FOR SLEEP - takes "somewhat") 180 tablet 0   modafinil (PROVIGIL) 200 MG tablet 1/2 table the morning for 1 week then 1 each morning (Patient taking differently: PRN) 30 tablet 1   paliperidone (INVEGA SUSTENNA) 156 MG/ML SUSY injection Inject 1 mL (156 mg total) into the muscle every 30 (thirty) days. 1 mL 3   propranolol (INDERAL) 20 MG tablet TAKE 1-2 TABLETS IN THE AM AS NEEDED FOR WORK STRESS 180 tablet 0   No current facility-administered medications for this visit.    Medication Side Effects: weight, ? Breast enlargment  Allergies: No Known Allergies  Past Medical History:  Diagnosis Date   Migraine     History reviewed. No pertinent family history.  Social History   Socioeconomic History   Marital status: Single    Spouse name: Not on file   Number of children: Not on file   Years of education: Not on file   Highest education level: Not on file  Occupational History   Not on file  Tobacco Use   Smoking status: Light Smoker   Smokeless tobacco: Never  Substance and Sexual Activity   Alcohol use: Not on file   Drug use: Not on file   Sexual activity: Not on file  Other Topics Concern   Not on file  Social History Narrative   Not on file   Social Determinants of Health  Financial Resource Strain: Not on file  Food Insecurity: Not on file  Transportation Needs: Not on file  Physical Activity: Not on file  Stress: Not on file  Social Connections: Not on file  Intimate Partner Violence: Not on file    Past Medical History, Surgical history, Social history, and Family history were reviewed and updated as appropriate.   Fraternal twin brother 2 other brothers  Please see review of systems for further details on the  patient's review from today.   Objective:   Physical Exam:  There were no vitals taken for this visit.  Physical Exam Constitutional:      General: He is not in acute distress. Musculoskeletal:        General: No deformity.  Neurological:     Mental Status: He is alert and oriented to person, place, and time.     Coordination: Coordination normal.  Psychiatric:        Attention and Perception: He is attentive. He does not perceive auditory or visual hallucinations.        Mood and Affect: Mood is anxious. Mood is not depressed. Affect is not labile, blunt, angry or inappropriate.        Speech: Speech normal.        Behavior: Behavior normal.        Thought Content: Thought content is not paranoid or delusional. Thought content does not include homicidal or suicidal ideation. Thought content does not include suicidal plan.        Cognition and Memory: Cognition and memory normal.        Judgment: Judgment normal.     Comments: Insight intact Mild paranoia resolved Some discomfort around people..  No HI or intent. Limited eye contact but  pleasant.     Lab Review:     Component Value Date/Time   NA 138 08/25/2020 1522   K 4.1 08/25/2020 1522   CL 101 08/25/2020 1522   CO2 26 08/25/2020 1522   GLUCOSE 95 08/25/2020 1522   BUN 12 08/25/2020 1522   CREATININE 0.92 08/25/2020 1522   CALCIUM 9.1 08/25/2020 1522   PROT 7.0 08/25/2020 1522   ALBUMIN 4.2 08/25/2020 1522   AST 21 08/25/2020 1522   ALT 17 08/25/2020 1522   ALKPHOS 59 08/25/2020 1522   BILITOT 0.7 08/25/2020 1522   GFRNONAA >60 08/25/2020 1522       Component Value Date/Time   WBC 8.2 08/25/2020 1522   RBC 5.20 08/25/2020 1522   HGB 14.8 08/25/2020 1522   HCT 45.6 08/25/2020 1522   PLT 293 08/25/2020 1522   MCV 87.7 08/25/2020 1522   MCH 28.5 08/25/2020 1522   MCHC 32.5 08/25/2020 1522   RDW 12.1 08/25/2020 1522    No results found for: "POCLITH", "LITHIUM"   No results found for: "PHENYTOIN",  "PHENOBARB", "VALPROATE", "CBMZ"   Normal Head CT scan.  They asked whether further neuro work-up was indicated and there does not appear to be focal neurologic signs or symptoms to suggest that.  Genesight testing disc in detail.  Disc Mother's concerns with his lack of motivation and energy and lack of self direction.  .res Assessment: Plan:    Kennard was seen today for follow-up and anxiety.  Diagnoses and all orders for this visit:  Schizoaffective disorder, unspecified type ([place])  Generalized anxiety disorder   Greater than 50% of 30 min face to face time with patient and at times with his mother was spent on counseling and coordination of  care.  He has not had any problems with dose reduction.  Psychotic sx still under control with still sig reduced eye contract.   We discussed the importance of med compliance and how that can change future outcomes with psychotic symptoms as well as relapse risk.  We discussed side effects of antipsychotic medications.    Overall his psychotic symptoms are generally resolved..still neg sx of schizophrenia .  No hostility   Continue counseling with Elio Forget  and work on Pharmacist, community and self care .  That is helpful  Support groups [facility], [facility], [facility].  Going to [employer] support group by Zoom a couple of times.  Faith based.  continuee  [place] every 4 weeks 156 mg q weeks .  We discussed that it can take several months for long-acting injectables to have optimal effect and reach steady state.  He is tolerating the antipsychotic well.  His psychosis is managed but is still sleeping a good bit.  It is too early to consider another dose reduction.   Conisder checking prolactin  Consider alternatives DT SE Vraylar, Latuda, Caplyta.   Modafinil helped with alertness.   continue modafinil  200 mg daily prn.  This is less likely to risk relapse of psychosis than would be switching the antipsychotic..  We had an extensive discussion  about the current side effects of antipsychotics that he is experiencing and the potential for switch but that is very risky.  Propranolol 20-40 mg before work to deal with anxiety and neg thoughts with stressful coworker.  Disc dietary issues and risk weight gain.  Discussed potential metabolic side effects associated with atypical antipsychotics, as well as potential risk for movement side effects. Advised pt to contact office if movement side effects occur.  Option metformin, he declines   Tobacco use only rarely bc DDI.  Avoid THC.   Encourage exercise and stimulation.  FU 12 weeks  Meredith Staggers, MD, DFAPA   Please see After Visit Summary for patient specific instructions.  Future Appointments  Date Time Provider Department Center  10/18/2022 11:00 AM Waldron Session, [facility] CP-CP None  11/14/2022  3:00 PM Waldron Session, [facility] CP-[facility] None  11/27/2022  3:00 PM Waldron Session, [facility] CP-[facility] None    No orders of the defined types were placed in this encounter.    -------------------------------

## 2022-10-03 ENCOUNTER — Ambulatory Visit: Payer: BC Managed Care – PPO | Admitting: Mental Health

## 2022-10-11 ENCOUNTER — Other Ambulatory Visit: Payer: Self-pay | Admitting: Psychiatry

## 2022-10-11 DIAGNOSIS — F259 Schizoaffective disorder, unspecified: Secondary | ICD-10-CM

## 2022-10-18 ENCOUNTER — Ambulatory Visit (INDEPENDENT_AMBULATORY_CARE_PROVIDER_SITE_OTHER): Payer: BC Managed Care – PPO

## 2022-10-18 ENCOUNTER — Ambulatory Visit: Payer: BC Managed Care – PPO

## 2022-10-18 ENCOUNTER — Ambulatory Visit (INDEPENDENT_AMBULATORY_CARE_PROVIDER_SITE_OTHER): Payer: BC Managed Care – PPO | Admitting: Mental Health

## 2022-10-18 DIAGNOSIS — F259 Schizoaffective disorder, unspecified: Secondary | ICD-10-CM

## 2022-10-24 NOTE — Progress Notes (Signed)
[facility] Counsel[facility] Psychotherapy Note  Name: Scott Vargas Date: 10/18/22 MRN: [id number] DOB: 1995/12/08 PCP: [name], MD  Time spent: 50 minutes  Treatment:  individual therapy  Mental Status Exam:    Appearance:    Casual     Behavior:   Appropriate  Motor:   WNL  Speech/Language:    Clear and Coherent  Affect:   Full range   Mood:   Euthymic  Thought process:   normal  Thought content:     WNL  Sensory/Perceptual disturbances:     none  Orientation:   x4  Attention:   Good  Concentration:   Good  Memory:   Intact  Fund of knowledge:    Consistent with age and development  Insight:     Good  Judgment:    Good  Impulse Control:   Good     Reported Symptoms:  anxiety, depression, rumination, avoidant behavior (crowds at times)  Risk Assessment: Danger to Self:  No Self-injurious Behavior: No Danger to Others: No Duty to Warn:no Physical Aggression / Violence:No  Access to Firearms a concern: No  Gang Involvement:No  Patient / guardian was educated about steps to take if suicide or homicide risk level increases between visits: yes While future psychiatric events cannot be accurately predicted, the patient does not currently require acute inpatient psychiatric care and does not currently meet [facility] involuntary commitment criteria.   Diagnoses:    ICD-10-CM   1. Schizoaffective disorder, unspecified type (HCC)  F25.9        Subjective:   Patient presents on time for today's session.  Patient shared recent changes.  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  He is looking for a job in the meantime, did not obtain a job at [employer] however, he plans to continue to look at job options whether it be in the field directly or not.  He shared how he and the girl he was dating have broken up, time was spent for him to allow to share details related and how ultimately he feels this  change is for the best, particularly due to his learning that she was already, verbally committing to someone else online indicating that she already had another boyfriend the next day.  He stated that he is talking to others, 1 girl in particular but plans to take it slowly and let whatever relationship may occur develop over time.  He continues to report a positive response to his medications, denies any symptoms related.  He wants to integrate exercise into his daily regimen which historically has been mountain biking, but due to recent weather, plans to start that in the spring.  He plans to look into other options.  Interventions:   Motivational interviewing, supportive therapy  Plan: Patient is to use CBT, mindfulness and coping skills to help decrease symptoms associated with their diagnosis.  Patient continues to embrace ways to take steps toward his independence and self acceptance.    Long-term goal:   Reduce overall level, frequency, and intensity of the feelings of anxiety in social situations from every occurrence causing anxiety to 1 our of 5 occurrences.       Short-term goal:  Patient to continue to work on identifying thoughts that make his anxiety increased in social situations and to continue to allow himself to engage in social situations to continue to try and work past his anxiety.  Patient to ground himself with rational, calming self talk when having intrusive thoughts. Increased feelings of self-worth by taking steps toward achieving more independence by finding and maintaining employment    Assessment of progress:  progressing   This record has been created using [employer].  Chart creation errors have been sought, but may not always have been located and corrected. Such creation errors do not reflect on the standard of medical care.     Waldron Session, [facility]

## 2022-10-24 NOTE — Progress Notes (Signed)
NURSES NOTE:    Pt arrived for his monthly injection of Invega Sustenna 156 mg/ml, he received in his left upper gluteal area. He tolerated well and said it didn't hurt today. His last dose was on 09/18/2022. Pt did bring his own injection today filled at [facility], it was only $10.00 with his co-pay card.    LOT [id number]  EXP 003/2025

## 2022-11-14 ENCOUNTER — Ambulatory Visit: Payer: BC Managed Care – PPO | Admitting: Mental Health

## 2022-11-27 ENCOUNTER — Ambulatory Visit (INDEPENDENT_AMBULATORY_CARE_PROVIDER_SITE_OTHER): Payer: BC Managed Care – PPO | Admitting: Mental Health

## 2022-11-27 ENCOUNTER — Ambulatory Visit (INDEPENDENT_AMBULATORY_CARE_PROVIDER_SITE_OTHER): Payer: BC Managed Care – PPO

## 2022-11-27 DIAGNOSIS — F259 Schizoaffective disorder, unspecified: Secondary | ICD-10-CM

## 2022-11-27 NOTE — Progress Notes (Signed)
NURSES NOTE:    Pt arrived for his monthly injection of Invega Sustenna 156 mg/ml. When walking in pt received a phone call , instructed him to answer since he's applied for jobs in case its an interview. It was a potential job, he spoke with them for a little bit and has in person interview tomorrow. He was excited about the potential. He did mention he has dropped out of school. Pt then given injection of Invega Sustenna 156mg /ml in his left upper gluteal area. He tolerated well. His last dose was on 10/18/2022. Pt did bring his own injection today [facility] at [facility], it was only $10.00 with his co-pay card.    LOT [id number] EXP 2025-MAY

## 2022-11-27 NOTE — Progress Notes (Signed)
[facility] Psychotherapy Note  Name: Scott Vargas Date: 11/27/22 MRN: [id number] DOB: 06/26/1996 PCP: Shirline Frees, MD  Time spent: 50 minutes  Treatment:  individual therapy  Mental Status Exam:    Appearance:    Casual     Behavior:   Appropriate  Motor:   WNL  Speech/Language:    Clear and Coherent  Affect:   Full range   Mood:   Euthymic  Thought process:   normal  Thought content:     WNL  Sensory/Perceptual disturbances:     none  Orientation:   x4  Attention:   Good  Concentration:   Good  Memory:   Intact  Fund of knowledge:    Consistent with age and development  Insight:     Good  Judgment:    Good  Impulse Control:   Good     Reported Symptoms:  anxiety, depression, rumination, avoidant behavior (crowds at times)  Risk Assessment: Danger to Self:  No Self-injurious Behavior: No Danger to Others: No Duty to Warn:no Physical Aggression / Violence:No  Access to Firearms a concern: No  Gang Involvement:No  Patient / guardian was educated about steps to take if suicide or homicide risk level increases between visits: yes While future psychiatric events cannot be accurately predicted, the patient does not currently require acute inpatient psychiatric care and does not currently meet [facility] involuntary commitment criteria.   Diagnoses:    ICD-10-CM   1. Schizoaffective disorder, unspecified type ([place]) [F25.9]  F25.9         Subjective:   Patient presents on time for today's session.  Assessed progress since last visit.  He stated that he has been under stress related to finances.  He stated he continues to have support from his family continuing to live with his parents, however, he stated that he has not secured a job although he has been looking diligently over the past few weeks.  He shared experiences of some of the interviews he has had thus far.  He expressed some concerns about working full-time, feels working part-time might be  more suitable as to not get overly stressed and reflecting on his history of work which consists of frequent job changing.  He expressed concern about how this looks when he does try to apply for jobs.  He was planning on taking course work for [employer] however, decided to withdraw from the program due to its intensity level which he stated was going to be 5 days/week up to 8 hours/day.  He stated he is more focused and feels that he needs to try and find work and receive income.  He continues to report a positive response to his injections which he reports are managing symptoms effectively.  He continues to identify how he would like to get to a point where he can reduce his medication dosage or try a different medication and plans to follow-up at his next med management appointment to further discuss.  Provide support and utilized motivational interviewing to facilitate patient identifying needs, clarifying feelings throughout.   Interventions:   Motivational interviewing, supportive therapy  Plan: Patient is to use CBT, mindfulness and coping skills to help decrease symptoms associated with their diagnosis.  Patient continues to embrace ways to take steps toward his independence and self acceptance.    Long-term goal:   Reduce overall level, frequency, and intensity of the feelings of anxiety in social situations from every occurrence causing anxiety to 1 our of 5  occurrences.  Decrease feelings of sadness and anxiety related to life stressors.   Short-term goal:  Patient to continue to work on identifying thoughts that make his anxiety increased in social situations and to continue to allow himself to engage in social situations to continue to try and work past his anxiety.   Patient to ground himself with rational, calming self talk when having intrusive thoughts. Increased feelings of self-worth by taking steps toward achieving more independence by finding and maintaining  employment    Assessment of progress:  progressing   This record has been created using [employer].  Chart creation errors have been sought, but may not always have been located and corrected. Such creation errors do not reflect on the standard of medical care.     [name], [facility]

## 2022-12-05 ENCOUNTER — Ambulatory Visit: Payer: BC Managed Care – PPO | Admitting: Mental Health

## 2022-12-16 ENCOUNTER — Ambulatory Visit: Payer: BC Managed Care – PPO | Admitting: Mental Health

## 2022-12-16 ENCOUNTER — Ambulatory Visit: Payer: BC Managed Care – PPO

## 2022-12-25 ENCOUNTER — Ambulatory Visit (INDEPENDENT_AMBULATORY_CARE_PROVIDER_SITE_OTHER): Payer: BC Managed Care – PPO

## 2022-12-25 DIAGNOSIS — F259 Schizoaffective disorder, unspecified: Secondary | ICD-10-CM | POA: Diagnosis not present

## 2022-12-25 NOTE — Progress Notes (Signed)
NURSES NOTE:    Pt arrived for his monthly injection of Invega Sustenna 156 mg/ml. Pt given injection of [place] '156mg'$ /ml in his left upper gluteal area. He tolerated well. His last dose was on 11/27/2022. Pt did bring his own injection today filled at [facility].    LOT [place] 2025-JUL

## 2023-01-02 ENCOUNTER — Ambulatory Visit: Payer: BC Managed Care – PPO | Admitting: Psychiatry

## 2023-01-16 ENCOUNTER — Telehealth: Payer: Self-pay | Admitting: Psychiatry

## 2023-01-16 ENCOUNTER — Other Ambulatory Visit: Payer: Self-pay | Admitting: Psychiatry

## 2023-01-16 DIAGNOSIS — F259 Schizoaffective disorder, unspecified: Secondary | ICD-10-CM

## 2023-01-16 MED ORDER — INVEGA SUSTENNA 117 MG/0.75ML IM SUSY: 117.0000 mg | PREFILLED_SYRINGE | INTRAMUSCULAR | 1 refills | Status: DC

## 2023-01-16 NOTE — Telephone Encounter (Signed)
Pt asking to reduce Invega sustenna again.  [place] with next lower dose being 117 mg every month.  Sent to pharmacy.  Looks there was generic of higher doses but only brand at this dose.  Don't dknow  if it will require a PA

## 2023-01-16 NOTE — Telephone Encounter (Signed)
Dr. Clovis Vargas would have to determine decreasing his dose.

## 2023-01-16 NOTE — Telephone Encounter (Signed)
Please see message from patient in regards to his shot.

## 2023-01-16 NOTE — Telephone Encounter (Signed)
Noted thanks I will follow up with pharmacy tomorrow.

## 2023-01-16 NOTE — Telephone Encounter (Signed)
Pt called and would like to go down  on the dosage of the shot. The shot is making him feel like a zombie. He is  not able to work. Sleeping 10 to 11 hours and still needs a nap in the middle of day.  His next shot  is on march 26th. Please call him with an answer at [phone]

## 2023-01-17 NOTE — Telephone Encounter (Signed)
Patient called back and I informed him his dose was reduced but I need to talk to his pharmacy to see if we need a PA and if I need to update a new co-pay card.   As soon as I speak with pharmacist I will let pt know. He is scheduled Tuesday to come for his next injection.

## 2023-01-17 NOTE — Telephone Encounter (Signed)
Spoke with pharmacist and they have already processed lower dose of 117 mg, no new PA or co-pay card needed. $10.00 co-pay for pt.   Pt was also notified and will bring in on Tuesday 01/21/2023

## 2023-01-17 NOTE — Telephone Encounter (Signed)
Left pt vm to call back

## 2023-01-21 ENCOUNTER — Ambulatory Visit (INDEPENDENT_AMBULATORY_CARE_PROVIDER_SITE_OTHER): Payer: BC Managed Care – PPO | Admitting: Mental Health

## 2023-01-21 ENCOUNTER — Ambulatory Visit (INDEPENDENT_AMBULATORY_CARE_PROVIDER_SITE_OTHER): Payer: BC Managed Care – PPO

## 2023-01-21 DIAGNOSIS — F259 Schizoaffective disorder, unspecified: Secondary | ICD-10-CM | POA: Diagnosis not present

## 2023-01-21 MED ORDER — PALIPERIDONE PALMITATE ER 117 MG/0.75ML IM SUSY
117.0000 mg | PREFILLED_SYRINGE | Freq: Once | INTRAMUSCULAR | Status: AC
  Administered 2023-02-25: 117 mg via INTRAMUSCULAR

## 2023-01-21 NOTE — Progress Notes (Signed)
NURSES NOTE:    Pt arrived for his monthly injection of [place] but he requested his dose to be lowered. Dr. Clovis Pu ordered the next dose down, which is 117 mg/0.64ml [place]. Pt given injection of [place] 117mg /0.75 ml in his left upper gluteal area. He tolerated well. His last dose was off [place] 156 mg was on 12/25/2022. Pt picked up his injection today [facility] at [facility]. Pt advised to schedule next dose in 28 days.    LOT [id number] EXP 2025-JUL

## 2023-01-21 NOTE — Progress Notes (Signed)
[facility] Psychotherapy Note  Name: Scott Vargas Date: 01/21/23 MRN: [id number] DOB: [birth year] PCP: Shirline Frees, MD  Time spent: 51 minutes  Treatment:  individual therapy  Mental Status Exam:    Appearance:    Casual     Behavior:   Appropriate  Motor:   WNL  Speech/Language:    Clear and Coherent  Affect:   Full range   Mood:   Euthymic  Thought process:   normal  Thought content:     WNL  Sensory/Perceptual disturbances:     none  Orientation:   x4  Attention:   Good  Concentration:   Good  Memory:   Intact  Fund of knowledge:    Consistent with age and development  Insight:     Good  Judgment:    Good  Impulse Control:   Good     Reported Symptoms:  anxiety, depression, rumination, avoidant behavior (crowds at times)  Risk Assessment: Danger to Self:  No Self-injurious Behavior: No Danger to Others: No Duty to Warn:no Physical Aggression / Violence:No  Access to Firearms a concern: No  Gang Involvement:No  Patient / guardian was educated about steps to take if suicide or homicide risk level increases between visits: yes While future psychiatric events cannot be accurately predicted, the patient does not currently require acute inpatient psychiatric care and does not currently meet [facility] involuntary commitment criteria.   Diagnoses:  No diagnosis found.     Subjective:   Patient presents on time for today's session.  Assessed progress since last visit.  He shared how he currently is living with his grandfather as he is elderly and providing some supportive care.  He stated that he continues to receive his injection with a lower dose recently.  Patient is hopeful that he can maintain with a lower dose as there are some side effects such as drowsiness and weight gain.  Explored symptoms where he stated the medication is effective in management.  Provide support as we facilitated his sharing experiences with history of going psychiatric  inpatient, coping with his diagnosis and the impact this had on his ability to try and maintain full-time employment.  Patient currently is unemployed.  He continues to have support from his family as these relationships were further assessed.  Ways to continue to cope and care for himself were explored, continuing to reach out to friends to remain in contact, stated that he is talking to a girl he met online recently and this is also going well.  Interventions:   Motivational interviewing, supportive therapy  Plan: Patient is to use CBT, mindfulness and coping skills to help decrease symptoms associated with their diagnosis.  Patient continues to embrace ways to take steps toward his independence and self acceptance.    Long-term goal:   Reduce overall level, frequency, and intensity of the feelings of anxiety in social situations from every occurrence causing anxiety to 1 our of 5 occurrences.  Decrease feelings of sadness and anxiety related to life stressors.   Short-term goal:  Patient to continue to work on identifying thoughts that make his anxiety increased in social situations and to continue to allow himself to engage in social situations to continue to try and work past his anxiety.   Patient to ground himself with rational, calming self talk when having intrusive thoughts. Increased feelings of self-worth by taking steps toward achieving more independence by finding and maintaining employment    Assessment of progress:  progressing  This record has been created using [employer].  Chart creation errors have been sought, but may not always have been located and corrected. Such creation errors do not reflect on the standard of medical care.     [name], [facility]

## 2023-01-27 DIAGNOSIS — J019 Acute sinusitis, unspecified: Secondary | ICD-10-CM | POA: Diagnosis not present

## 2023-02-11 ENCOUNTER — Telehealth: Payer: Self-pay | Admitting: Psychiatry

## 2023-02-11 NOTE — Telephone Encounter (Signed)
Patient has an appt with you tomorrow. He would like to lower the dose of the [place]. He said it makes him too fatigued. He feels his MH is good and would like to talk about coming off of it altogether.

## 2023-02-11 NOTE — Telephone Encounter (Signed)
Pt called at 1:28p.  He has appt with Dr Jennelle Human tomorrow, but he called saying he wanted Dr Jennelle Human to think about the [facility] SUSTENNA  that he is taking.  He said it's been about 3 yrs since he had his breakdown and he feeling some side effects.  Next appt 4/17

## 2023-02-12 ENCOUNTER — Ambulatory Visit (INDEPENDENT_AMBULATORY_CARE_PROVIDER_SITE_OTHER): Payer: BC Managed Care – PPO | Admitting: Psychiatry

## 2023-02-12 ENCOUNTER — Encounter: Payer: Self-pay | Admitting: Psychiatry

## 2023-02-12 DIAGNOSIS — F411 Generalized anxiety disorder: Secondary | ICD-10-CM | POA: Diagnosis not present

## 2023-02-12 DIAGNOSIS — F4010 Social phobia, unspecified: Secondary | ICD-10-CM | POA: Diagnosis not present

## 2023-02-12 DIAGNOSIS — F259 Schizoaffective disorder, unspecified: Secondary | ICD-10-CM

## 2023-02-12 MED ORDER — PROPRANOLOL HCL 20 MG PO TABS: 20.0000 mg | ORAL_TABLET | Freq: Two times a day (BID) | ORAL | 0 refills | Status: DC

## 2023-02-12 NOTE — Progress Notes (Signed)
Scott Vargas [id number] [birth year] 27 y.o.  Subjective:   Patient ID:  Scott Vargas is a 27 y.o. (DOB Mar 16, 1996) male.  Chief Complaint:  Chief Complaint  Patient presents with   Follow-up    schizoaffective   Fatigue   Medication Reaction    HPI Scott Vargas presents to the office today for follow-up of schizoaffective disorder with recent hospitalizations. First seen in our office 12/29/2020.  His symptoms were improved but not resolved and included depression, paranoia, intrusive violent images, chronic suicidal thoughts. He was just out of the hospital so no medications were in changed which included Invega IM every 4 weeks, risperidone 2 mg nightly, prazosin 2 mg nightly, trazodone 100 mg nightly, hydroxyzine 50 mg 3 times daily as needed  01/12/2021 appointment with the following noted: Working and going pretty smooth.  Scott Vargas out with friends and it went better than I thought. 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   No pot use since here. Mood pretty chill. Still gets voices negative and positive like "people don't like you".  Voices come and go.  Dealt with it a long time but never told anyone.  Better if busy.   Not sure about character of voices.  They come to me.  Worse when not busy and any time of day.  Voices are frustrating.  Voices got worse July 2020 in sprittual experience.  Not depressed.  Pleased friends accepted him better than expected. Scott Vargas which is more centered spiriitually.   Still seeing therapist. Some people bother him but not sure why.  Trying to make new friends. In Scott Vargas got 2 restraining orders from 2 different churches.  No outstanding charges. Alcohol 2 -3 per week. Started Depakote without problems.  Pretty well with anger. Sleep OK with mixed type.  NM awaken him once weekly. No SE.  Then with parents Scott Vargas & dad Not as depressed as he was. M & F notes increase anxiety as got closer to injection  date.  F sees it's "night and day"  Difference and I have my son back. M agrees he's exhibited bravery in doing difficult things. Last injection date March 12- day outpatient [facility] Scott Vargas, Scott Vargas.  Plan: Get next Scott Vargas 1 week early by 1 week for a couple of mos DT observed wearing off.  03/07/2021 appointment with the following noted:  Alone and with Mother Last received Invega Sustenna 234 mg per 1.5 mL injection on 02/26/2021.  Last 2 injections have it 3-week intervals instead of typical 4-week intervals bc of ongoing psychotic sx. Doing well and exercising and biking.  Doing ok with voices.  Still hear them somewhat but less prominent and ignoring them more.  Mood pretty calm.  Not lately much fear or anxiety.  Maybe a little better with small talk with people. 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 No problems with meds.  Sometimes lower motivation.    Less hydroxyzine 1/week.   Dreams good and bad still.  Rarely will be awakened by them. No anger outbursts. Mild paranoia out in public about what people are thinking. With Mom:  Superb cp to 2 mos ago.  Some days lacks motivation.  Better on days he works. Plan: Trial L-carnitine 1000 mg 1 twice daily to see if energy and focus are better. Change Invega Sustaina back to every 4 weeks at 234mg  q weeks   05/31/2021 appt noted: Called a couple days ago complaining of more anxiety. Lately with job search  and mostly unemployed has a lot of free time and not feeling good about himself.  Looking for job through [employer]. Sees GF regularly in [place].  Likes the summer. When in groups or around strangers has anxiety but it is improved over the past.  Does not feel as paranoid.  But will feel people are looking at him too much.  Causes anxiety but not anger.  Makes me not want to be around people.Takes hydroxyzine with anxiety and it helps. AH "not so bad".  Dreams can be scary or disturbing.   My mind was hijacked several months ago.   Meds leveled me out.   Taking trazodone at night for sleep.  Tolerating meds except weight gain from 170 to 210# over the year.  Hungry all the time. Risperidone has not bee taken. A/P: overall psychotic sx improved not resolved hydroxyzine instead of trazodone for sleep to see if dreams are better. Continue Scott Vargas back to every 4 weeks at 234mg  q weeks  Add risperidone 1/2 tablet nightly for 1-2 weeks and if no improvement in paranoia and anxiety then increase to 1 nightly  08/13/21 appt noted: alone and with mother Usually sleep good.  Rare NM. Better comfort around people. Went to [employer]. No paranoia.  AH less bothersome. Mainly evident intermittently.  Better than last visit. No depression. SE none except weight.   Energy still kind of low. 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 M sees a lot of improvement this year. Clare done well with counseling per pt and M.  She's concerned about his sleep about 12 hour and longer if nothing scheduled.   He says he doesn't have much to do & lacks motivation to exercise as in past.   Plan:  Continue hydroxyzine instead of trazodone for sleep bc dreams are better. Continue Invega Sustaina back to every 4 weeks at 234mg  q weeks DT observed wearing off.   Continue risperidone 1 mg nightly bc paranoia and voices are better.  10/18/21 appt noted: alone and with father Stopped all tablets including Depakote, risperidone bc didn't think he needed them. Thinks he needs hydroxyzine 50 hs for sleep. No violent thoughts off Depakote.   AH about the same or better off risperidone. Se with them he thinks he felt a little question.  Energy is better right now.  Not needing to nap in afternoon.  Still dreams a lot.  NM once weekly.  Rather not take the prazosin right now..   A little social avoidance ongoing but when he does seems ok. Disappointed he didn't get job at [employer].    Not currently working anywhere. No problems with shot  except sore for a wek after the shot.   Is video gaming again some now and wasn't for awhile.  Including complex games. F says invega has been a miracle drug. Plan: Continue Scott Vargas back to every 4 weeks at 234mg  q weeks  OK to stop risperidone id not needed for voices.  01/16/2022 appointment with the following noted: Sleep ok off hyrdroxyzine and prazosin without NM. Sleep 10 hours.   No risperidone. No voices but some intrusive thoughts that are not too bad. New job helps his negative thoughts.  Able to manage anger.  But can have thoughts of hurting others that provoke him couple times per week. No paranoid or fearful thoughts.  No acting ou tof anger.   Greater sense of purpose now that FT job.   No depression. Consistent with scheduled  injection.   Plan: No med changes  03/18/22 appt noted: Has been off risperidone since December 2022. Still hear the voices but not focusing on them.  Comes and goes and more when quiet and when busy not as much.  Voices remind him of things and can be positive or negative.  Reminded frequently of relationship struggles and how his mental health affected his relationships.   Not socially active. Busy most of the time with work.  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  Works for [employer].  Louann Sjogren is great. No longer has guns.  At workplace does have coworker who yells hat him but learning to tolerate him. Propranolol helps him deal with this at work. Getting counseling through [facility].   Sleeps 11-12 hours Weight gain 220.  2 years ago was 170# before psychosis and meds. CO SE weight gain, fatigue, sleepiness with Invega sustena and asksabout alternatives. Asks if he might eventually be able to stop antipsychotic. Plan: start modafinil 100 mg every morning for 1 week and then 200 mg daily for sleepiness.  Continue antipsychotic  05/13/22 appt noted: Has been consistent with monthly Invega Sustenna 234 mg per 1.5  mL IM SE wt gain and sleepy.  Hungry. Thinks invega dulled excitement to mountain bike and doesn't do it. Started prednisone today for poison ivy. Used 1/2 bottle modafinil and more alert at work.  Able to stay up later.  Uses it prn.  SE HA initially resolved. Sometimes trouble falling asleep.  Still vivid dreams and some NM.  Themes violent or sexual. It really affected him before the hsopital bc thought they were reality.    Now can tell the difference. Work performance affected by weight.  But still doing ok with work. Voices not very noticeable usually.   No sig paranoia but socially isolated and content about that.  Realizes it's not great long term.  Social interaction at work.  Was not confident talking to servers at restaurants and that's fine now. Aware he has trouble making eye contact with people. No longer feels hostile nor aggressive. Plan: Will reduce  Scott Vargas every 4 weeks 156 mg q weeks .    08/23/22 noted from nurse: Jamear came in yesterday 08/22/2022 and received a sample supply of Invega Sustenna 156 mg/ml to left upper buttocks IM, tolerated well. Informed him I would discuss with Dr. Jennelle Human and get him scheduled to see him asap.    Pt quit his job on Monday, October 23rd due to a conflict with a co-worker. Pt reports he does have another job, which is a work from home position he will be starting second week in November I believe. He is picking out new insurance that his Dad will pay for. Pt was asking to change medication prior to his issue with work due to weight gain. Pt has been on the lower dose of Invega 156 mg since July and needs follow up with Dr. Jennelle Human now as well to see how he's doing    MD response:  Noted.  We should not make any further med changes until he's seen.  He has an appointment with me in about 5 weeks.  We could consider some change if he wanted to see either Arlys John or Victorino Dike, the nurse practitioners, and then I could discuss the case with  them.  But if he wants to see me to make the decision he will need to keep his appointment with me and we will  go from there at that point      10/02/22 appt noted: also with father A little less hunger cravings and a little better energy with less Invega. Felt coworker was harassing him and felt he needed to quit. Was doing well until that week.  Had just gotten a GF at the time he quit.  If married with kids wouldn't have quit but he felt it was a no where job. 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  Has been accepted to the [employer] [facility] program. 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  Got financial aid for free school. Lost health insurance with loss of job.  Concerns for cost of Invega.  Applying for pt assistance. Wants firearm for hunting and self defense and target shooting.  Has had a concealed carry permit.  Has had guns for years before. No problems with meds and concerns with meds. No depression and anxiety, nor paranoia, nor agitation.   To bed 9 to 830 AM.  Needs that much sleep right now.    02/12/23 appt noted: aSking to reduce Invega again due to fatigue.  Called a couple of days ago. Hinda Glatter Sustenna dosage was reduced to 117 mg/mL as of 01/21/2023. Notice more energy with reduction and lost 10#.   No increase in anxiety or worry.  Got emotional for no reason talking to someone yesterday.  Overall has good social confidence.  But did have a lot of social anxiety in past when illness was more severe. Wanting to get gun rights restored and will need an attorney.  Was told it would need to be 7 years since IVC.  Wants to stop all meds.  He doesn't think he'll relapse off meds.   Applying for disability bc can't have energy to work outside.   Lives at home.  Parents say he can't stop meds AMA.   Daily HA for several years.  Can function through.  No aura.  No nausea or light sensitivity.  Not disabling.      Past Psychiatric Medication Trials: History Dr. Krista Blue outpatient [facility] hosp  10/2020 Lorazepam Propranolol Lamotrigine Invega sustaina started Jan 2022 in hospital [facility] Risperidone 6 Abilify 10 NR and wt gain Lithium 1200 in 2020 level 0.8 Trazodone, hydroxyzine, prazosin lithium Depakote 1000 started 12/29/20 Modafinil HA  Review of Systems:  Review of Systems  Constitutional:  Positive for fatigue and unexpected weight change.  Cardiovascular:  Negative for chest pain and palpitations.  Neurological:  Positive for headaches. Negative for tremors and weakness.  Psychiatric/Behavioral:  Negative for hallucinations and suicidal ideas.     Medications: I have reviewed the patient's current medications.  Current Outpatient Medications  Medication Sig Dispense Refill   Cetirizine HCl (ZYRTEC ALLERGY PO) Take by mouth at bedtime. Unsure of dosing. Takes with Benadryl for allergies and sleep.     diphenhydrAMINE (BENADRYL) 25 MG tablet Take 50 mg by mouth at bedtime as needed. Taking for allergies and a sleep aid     hydrOXYzine (ATARAX) 25 MG tablet TAKE 1-2 TABLETS AT NIGHT FOR SLEEP (Patient taking differently: TAKE 1-2 TABLETS AT NIGHT FOR SLEEP - takes "somewhat") 180 tablet 0   modafinil (PROVIGIL) 200 MG tablet 1/2 table the morning for 1 week then 1 each morning (Patient taking differently: PRN) 30 tablet 1   Paliperidone ER (INVEGA SUSTENNA) injection Inject 117 mg into the muscle every 30 (thirty) days. 0.9 mL 1   propranolol (INDERAL) 20 MG tablet Take 1 tablet (20 mg total) by mouth  2 (two) times daily. 180 tablet 0   Current Facility-Administered Medications  Medication Dose Route Frequency Provider Last Rate Last Admin   Paliperidone ER (INVEGA SUSTENNA) injection 117 mg  117 mg Intramuscular Once Cottle, Steva Ready., MD        Medication Side Effects: weight, ? Breast enlargment  Allergies: No Known Allergies  Past Medical History:  Diagnosis Date   Migraine     History reviewed. No pertinent family history.  Social History    Socioeconomic History   Marital status: Single    Spouse name: Not on file   Number of children: Not on file   Years of education: Not on file   Highest education level: Not on file  Occupational History   Not on file  Tobacco Use   Smoking status: Light Smoker   Smokeless tobacco: Never  Substance and Sexual Activity   Alcohol use: Not on file   Drug use: Not on file   Sexual activity: Not on file  Other Topics Concern   Not on file  Social History Narrative   Not on file   Social Determinants of Health   Financial Resource Strain: Not on file  Food Insecurity: Not on file  Transportation Needs: Not on file  Physical Activity: Not on file  Stress: Not on file  Social Connections: Not on file  Intimate Partner Violence: Not on file    Past Medical History, Surgical history, Social history, and Family history were reviewed and updated as appropriate.   Fraternal twin brother 2 other brothers  Please see review of systems for further details on the patient's review from today.   Objective:   Physical Exam:  There were no vitals taken for this visit.  Physical Exam Constitutional:      General: He is not in acute distress. Musculoskeletal:        General: No deformity.  Neurological:     Mental Status: He is alert and oriented to person, place, and time.     Coordination: Coordination normal.  Psychiatric:        Attention and Perception: He is attentive. He does not perceive auditory or visual hallucinations.        Mood and Affect: Mood is anxious. Mood is not depressed. Affect is not labile, blunt, angry or inappropriate.        Speech: Speech normal.        Behavior: Behavior normal.        Thought Content: Thought content is not paranoid or delusional. Thought content does not include homicidal or suicidal ideation. Thought content does not include suicidal plan.        Cognition and Memory: Cognition and memory normal.        Judgment: Judgment normal.      Comments: Insight intact Mild paranoia resolved Some discomfort around people..  No HI or intent. Limited eye contact but  pleasant.  Is a little better     Lab Review:     Component Value Date/Time   NA 138 08/25/2020 1522   K 4.1 08/25/2020 1522   CL 101 08/25/2020 1522   CO2 26 08/25/2020 1522   GLUCOSE 95 08/25/2020 1522   BUN 12 08/25/2020 1522   CREATININE 0.92 08/25/2020 1522   CALCIUM 9.1 08/25/2020 1522   PROT 7.0 08/25/2020 1522   ALBUMIN 4.2 08/25/2020 1522   AST 21 08/25/2020 1522   ALT 17 08/25/2020 1522   ALKPHOS 59 08/25/2020 1522   BILITOT  0.7 08/25/2020 1522   GFRNONAA >60 08/25/2020 1522       Component Value Date/Time   WBC 8.2 08/25/2020 1522   RBC 5.20 08/25/2020 1522   HGB 14.8 08/25/2020 1522   HCT 45.6 08/25/2020 1522   PLT 293 08/25/2020 1522   MCV 87.7 08/25/2020 1522   MCH 28.5 08/25/2020 1522   MCHC 32.5 08/25/2020 1522   RDW 12.1 08/25/2020 1522    No results found for: "POCLITH", "LITHIUM"   No results found for: "PHENYTOIN", "PHENOBARB", "VALPROATE", "CBMZ"   Normal Head CT scan.  They asked whether further neuro work-up was indicated and there does not appear to be focal neurologic signs or symptoms to suggest that.  Genesight testing disc in detail.  Disc Mother's concerns with his lack of motivation and energy and lack of self direction.  .res Assessment: Plan:    Swayze was seen today for follow-up, fatigue and medication reaction.  Diagnoses and all orders for this visit:  Schizoaffective disorder, unspecified type -     propranolol (INDERAL) 20 MG tablet; Take 1 tablet (20 mg total) by mouth 2 (two) times daily.  Generalized anxiety disorder -     propranolol (INDERAL) 20 MG tablet; Take 1 tablet (20 mg total) by mouth 2 (two) times daily.  Social anxiety disorder  Headaches   30 min face to face time with patient  He has not had any problems with dose reduction in [place] from 156 to 117 mg as of  01/21/2023.Marland Kitchen  Psychotic sx still under control with still sig reduced eye contract but is able to make eye contact.   We discussed the importance of med compliance and how that can change future outcomes with psychotic symptoms as well as relapse risk.  We discussed side effects of antipsychotic medications.   Overall his psychotic symptoms are generally resolved..still neg sx of schizophrenia .  No hostility   Continue counseling with Elio Forget  and work on Pharmacist, community and self care .  That is helpful  Support groups [facility], Kellen, N[facility]I.  Going to [employer] support group by Zoom a couple of times.  Faith based.  Just reduced [place] every 4 weeks to 117 mg q 4 weeks .  We discussed that it can take several months for long-acting injectables to have optimal effect and reach steady state.  His psychosis is managed but is still sleeping a good bit.  It is too early to consider another dose reduction.  Extensive discussion around my opinion that he has a version of schizophrenia that is highly likely to recur if he stops all antipsychotic.  In addition if he has recurrence of psychosis it is not guarantee that he will be able to recover as fully after such an episode as he is currently.  Therefore I will not agree to stopping antipsychotic at any time in the foreseeable future.  We could discuss options of switching to a less sedating antipsychotic.  But my recommendation would be to gradually reduce the 1 he is on rather than switch to a different antipsychotic because we know that Hinda Glatter has been effective.We had an extensive discussion about the current side effects of antipsychotics that he is experiencing and the potential for switch but that is very risky.  Conisder checking prolactin  Consider alternatives DT SE Vraylar, Latuda, Caplyta.    Propranolol 20-40 mg before work to deal with anxiety and neg thoughts with stressful coworker.  Can take it regularly to help  prevent HA.     DC modafinil because it caused headaches  Disc dietary issues and risk weight gain.  Discussed potential metabolic side effects associated with atypical antipsychotics, as well as potential risk for movement side effects. Advised pt to contact office if movement side effects occur.  Option metformin, he declined   Tobacco use only rarely bc DDI.  Avoid THC.   Encourage exercise and stimulation.  I support his pursuit of disability DT residual negative sx of schizophrenia and fatigue DT meds interfering with his ability to work outside.    FU 12 weeks  Meredith Staggers, MD, DFAPA   Please see After Visit Summary for patient specific instructions.  Future Appointments  Date Time Provider Department Center  02/21/2023 10:30 AM CP-NURSE CP-CP None  02/21/2023 11:00 AM Waldron Session, [facility] CP-CP None  03/25/2023  3:00 PM Waldron Session, [facility] CP-CP None  03/25/2023  4:00 PM CP-NURSE CP-CP None  05/13/2023  3:00 PM Cottle, Steva Ready., MD CP-CP None    No orders of the defined types were placed in this encounter.    -------------------------------

## 2023-02-18 ENCOUNTER — Telehealth: Payer: Self-pay | Admitting: Psychiatry

## 2023-02-18 NOTE — Telephone Encounter (Signed)
Scott Vargas called at 4:15 to report that he is still having headaches.  You told him it would not be the medication so he is wondering if you could referral him to a Dr. Claiborne Billings specializes in headaches.

## 2023-02-19 NOTE — Telephone Encounter (Signed)
Asking for a referral about his headaches?

## 2023-02-19 NOTE — Telephone Encounter (Signed)
Dr. Neale Burly is expert in HA,

## 2023-02-21 ENCOUNTER — Ambulatory Visit (INDEPENDENT_AMBULATORY_CARE_PROVIDER_SITE_OTHER): Payer: BC Managed Care – PPO | Admitting: Mental Health

## 2023-02-21 ENCOUNTER — Ambulatory Visit: Payer: BC Managed Care – PPO

## 2023-02-21 DIAGNOSIS — F259 Schizoaffective disorder, unspecified: Secondary | ICD-10-CM

## 2023-02-21 NOTE — Telephone Encounter (Signed)
Thank you :)

## 2023-02-21 NOTE — Telephone Encounter (Signed)
Referral was faxed to Dr. Neale Burly and [facility].

## 2023-02-21 NOTE — Progress Notes (Unsigned)
[facility] Psychotherapy Note  Name: Scott Vargas Date: 02/21/23 MRN: [id number] DOB: January 02, 1996 PCP: Scott Blamer, MD  Time spent: 50 minutes  Treatment:  individual therapy  Mental Status Exam:    Appearance:    Casual     Behavior:   Appropriate  Motor:   WNL  Speech/Language:    Clear and Coherent  Affect:   Full range   Mood:   Euthymic  Thought process:   normal  Thought content:     WNL  Sensory/Perceptual disturbances:     none  Orientation:   x4  Attention:   Good  Concentration:   Good  Memory:   Intact  Fund of knowledge:    Consistent with age and development  Insight:     Good  Judgment:    Good  Impulse Control:   Good     Reported Symptoms:  anxiety, depression, rumination, avoidant behavior (crowds at times)  Risk Assessment: Danger to Self:  No Self-injurious Behavior: No Danger to Others: No Duty to Warn:no Physical Aggression / Violence:No  Access to Firearms a concern: No  Gang Involvement:No  Patient / guardian was educated about steps to take if suicide or homicide risk level increases between visits: yes While future psychiatric events cannot be accurately predicted, the patient does not currently require acute inpatient psychiatric care and does not currently meet [facility] involuntary commitment criteria.   Diagnoses:    ICD-10-CM   1. Schizoaffective disorder, unspecified type (HCC)  F25.9          Subjective:   Patient presents on time for today's session.  Assessed progress. Patient shared how he has had a significant headache describing sensations that have occurred over the past 3 weeks. He stated the headache has not subsided. He stated that he did have a headache about 3 years ago prior to his developing more acute psychotic symptoms. He denies any other concerns, particularly any related to his having symptoms of psychosis. He plans to follow up at his next doctor appointment to discuss with Dr Scott Vargas. Social  relationships were assessed, he spent time with some friends recently which was helpful for his mood as he stated he enjoyed seeing them and noticed the benefits of getting out of the house more often. He feels that the decrease in his medication has also been helpful as he states that he can have more emotional range versus it being more subdued. He said he's also lost about 10 lbs, which is closer to his normal more healthy weight. Other family issues were discussed, feeling a sense of emotional disconnectedness from his mother over the past couple of years due to changes she may be going through in which he is not fully aware. Facilitated his identifying needs, ways to continue to cope and care for himself getting adequate rest, ensuring that he takes steps to be more social and communicate with his friends.  Interventions:   Motivational interviewing, supportive therapy  Plan: Patient is to use CBT, mindfulness and coping skills to help decrease symptoms associated with their diagnosis.  Patient continues to embrace ways to take steps toward his independence and self acceptance.    Long-term goal:   Reduce overall level, frequency, and intensity of the feelings of anxiety in social situations from every occurrence causing anxiety to 1 our of 5 occurrences.  Decrease feelings of sadness and anxiety related to life stressors.   Short-term goal:  Patient to continue to work on identifying thoughts that  make his anxiety increased in social situations and to continue to allow himself to engage in social situations to continue to try and work past his anxiety.   Patient to ground himself with rational, calming self talk when having intrusive thoughts. Increased feelings of self-worth by taking steps toward achieving more independence by finding and maintaining employment    Assessment of progress:  progressing   This record has been created using [employer].  Chart creation errors have been  sought, but may not always have been located and corrected. Such creation errors do not reflect on the standard of medical care.     Scott Vargas Session, [facility]

## 2023-02-24 ENCOUNTER — Telehealth: Payer: Self-pay | Admitting: Psychiatry

## 2023-02-24 DIAGNOSIS — R519 Headache, unspecified: Secondary | ICD-10-CM | POA: Diagnosis not present

## 2023-02-24 DIAGNOSIS — I10 Essential (primary) hypertension: Secondary | ICD-10-CM | POA: Diagnosis not present

## 2023-02-24 NOTE — Telephone Encounter (Signed)
Refereral faxed to Dr. Santiago Glad at [facility].

## 2023-02-25 ENCOUNTER — Ambulatory Visit: Payer: BC Managed Care – PPO

## 2023-02-25 ENCOUNTER — Ambulatory Visit (INDEPENDENT_AMBULATORY_CARE_PROVIDER_SITE_OTHER): Payer: BC Managed Care – PPO

## 2023-02-25 DIAGNOSIS — F259 Schizoaffective disorder, unspecified: Secondary | ICD-10-CM | POA: Diagnosis not present

## 2023-02-25 NOTE — Progress Notes (Signed)
NURSES NOTE:    Pt arrived for his monthly injection of [place], this will be his second dose on the lower dose of 117 mg/0.75 ml. Pt given injection of [place] 117mg /0.75 ml in his left upper gluteal area. He tolerated well. His last dose was off [place] 156 mg was on 01/21/2023. Pt picked up his injection today filled at [facility]. Pt advised to schedule next dose in 28 days.    LOT [id number] EXP 2025-AUG

## 2023-02-26 ENCOUNTER — Telehealth: Payer: Self-pay

## 2023-02-26 NOTE — Telephone Encounter (Signed)
Noted.  Thank you.  Glad pt got his htn addressed and may solve HA problem.

## 2023-02-26 NOTE — Telephone Encounter (Signed)
FYI, pt came in yesterday for his monthly Invega and reports he did go to his PCP and the headaches are stemming from hypertension. His PCP put him on a medication for his blood pressure, he couldn't remember the name. The doctor told him to stop the Propranolol. Pt presented well yesterday and reports losing 10-15 pounds, not sure the time frame. He received his 2 nd dose of [place] at 117 mg. Pt has a schduled apt with Dr. Santiago Glad and pt still wants to keep that apt too. Not sure if his elevated B/P is from weight gain or injection, but he's had them about 3 weeks now.

## 2023-02-27 NOTE — Telephone Encounter (Signed)
From Dr Alwyn Ren 4/17 note:    Just reduced [name] every 4 weeks to 117 mg q 4 weeks .  We discussed that it can take several months for long-acting injectables to have optimal effect and reach steady state.  His psychosis is managed but is still sleeping a good bit.  It is too early to consider another dose reduction.  Extensive discussion around my opinion that he has a version of schizophrenia that is highly likely to recur if he stops all antipsychotic.  In addition if he has recurrence of psychosis it is not guarantee that he will be able to recover as fully after such an episode as he is currently.  Therefore I will not agree to stopping antipsychotic at any time in the foreseeable future.  We could discuss options of switching to a less sedating antipsychotic.  But my recommendation would be to gradually reduce the 1 he is on rather than switch to a different antipsychotic because we know that Hinda Glatter has been effective.We had an extensive discussion about the current side effects of antipsychotics that he is experiencing and the potential for switch but that is very risky.

## 2023-02-27 NOTE — Telephone Encounter (Signed)
Pt called and said that he would like to go down on his invega injection in the next month or two. He said that the headaches are not from the invega injections. He just wanted dr. Jennelle Human to know

## 2023-03-11 NOTE — Telephone Encounter (Signed)
Pt called back again and would like to know dr. Alwyn Ren opinion on lower the dosage of his invegga shot. Please call him at [phone]

## 2023-03-11 NOTE — Telephone Encounter (Signed)
Read him from Dr. Alwyn Ren visit note of 4/17. He said he figured Dr. Jennelle Human would say that.

## 2023-03-14 ENCOUNTER — Other Ambulatory Visit: Payer: Self-pay | Admitting: Psychiatry

## 2023-03-14 DIAGNOSIS — F259 Schizoaffective disorder, unspecified: Secondary | ICD-10-CM

## 2023-03-25 ENCOUNTER — Ambulatory Visit (INDEPENDENT_AMBULATORY_CARE_PROVIDER_SITE_OTHER): Payer: BC Managed Care – PPO | Admitting: Mental Health

## 2023-03-25 ENCOUNTER — Ambulatory Visit (INDEPENDENT_AMBULATORY_CARE_PROVIDER_SITE_OTHER): Payer: BC Managed Care – PPO

## 2023-03-25 DIAGNOSIS — F259 Schizoaffective disorder, unspecified: Secondary | ICD-10-CM | POA: Diagnosis not present

## 2023-03-25 NOTE — Progress Notes (Signed)
NURSES NOTE:    Pt arrived for his monthly injection of [place], he previously had apt with [name]. Pt given injection of [place] 117mg /0.75 ml in his left upper gluteal area. He tolerated well. His last dose was of [place]  was on 02/25/2023. Pt brings his medication [facility] from [facility]. Pt advised to schedule next dose in 28 days.    LOT [id number] EXP 2025-AUG

## 2023-03-25 NOTE — Progress Notes (Signed)
[facility] Psychotherapy Note  Name: Scott Vargas Date: 03/25/23 MRN: [id number] DOB: Jul 02, 1996 PCP: [name], MD  Time spent: 35 minutes  Treatment:  individual therapy  Mental Status Exam:    Appearance:    Casual     Behavior:   Appropriate  Motor:   WNL  Speech/Language:    Clear and Coherent  Affect:   Full range   Mood:   Euthymic  Thought process:   normal  Thought content:     WNL  Sensory/Perceptual disturbances:     none  Orientation:   x4  Attention:   Good  Concentration:   Good  Memory:   Intact  Fund of knowledge:    Consistent with age and development  Insight:     Good  Judgment:    Good  Impulse Control:   Good     Reported Symptoms:  anxiety, depression, rumination, avoidant behavior (crowds at times)  Risk Assessment: Danger to Self:  No Self-injurious Behavior: No Danger to Others: No Duty to Warn:no Physical Aggression / Violence:No  Access to Firearms a concern: No  Gang Involvement:No  Patient / guardian was educated about steps to take if suicide or homicide risk level increases between visits: yes While future psychiatric events cannot be accurately predicted, the patient does not currently require acute inpatient psychiatric care and does not currently meet [facility] involuntary commitment criteria.   Diagnoses:  No diagnosis found.      Subjective:   Patient presents on time for today's session.  He shared progress, how he has had more energy over the past couple of months which has helped him be more social.  He attributes this to his medication dosage being lowered.  This in combination with his starting to work out again recently has helped with his energy levels.  Reports getting adequate rest at night, continues his search for a part-time position of employment.  Family relationships were assessed where he stated that some differences in terms of religious beliefs have been of debate recently but generally  speaking, family relationships are going well.  Reports that his parents continue to have marital strain and went on to share how this can affect the family as a whole.  He expressed motivation to make more friendships, has made attempts to meet people recently, 1 girl in particular with him he has some interest he plans to continue to follow through in communication.  He is and hopes to have a girlfriend again soon possibly as he shared the positive interactions he has had with her thus far.  He denies any SI, denies any intrusive thoughts. Interventions:   Motivational interviewing, supportive therapy  Plan: Patient is to use CBT, mindfulness and coping skills to help decrease symptoms associated with their diagnosis.  Patient continues to embrace ways to take steps toward his independence and self acceptance.    Long-term goal:   Reduce overall level, frequency, and intensity of the feelings of anxiety in social situations from every occurrence causing anxiety to 1 our of 5 occurrences.  Decrease feelings of sadness and anxiety related to life stressors.   Short-term goal:  Patient to continue to work on identifying thoughts that make his anxiety increased in social situations and to continue to allow himself to engage in social situations to continue to try and work past his anxiety.   Patient to ground himself with rational, calming self talk when having intrusive thoughts. Increased feelings of self-worth by taking steps toward  achieving more independence by finding and maintaining employment    Assessment of progress:  progressing   This record has been created using [employer].  Chart creation errors have been sought, but may not always have been located and corrected. Such creation errors do not reflect on the standard of medical care.     Waldron Session, [facility]

## 2023-04-09 ENCOUNTER — Ambulatory Visit: Payer: BC Managed Care – PPO | Admitting: Mental Health

## 2023-04-22 ENCOUNTER — Ambulatory Visit (INDEPENDENT_AMBULATORY_CARE_PROVIDER_SITE_OTHER): Payer: BC Managed Care – PPO

## 2023-04-22 ENCOUNTER — Ambulatory Visit: Payer: BC Managed Care – PPO | Admitting: Mental Health

## 2023-04-22 DIAGNOSIS — F259 Schizoaffective disorder, unspecified: Secondary | ICD-10-CM

## 2023-04-22 NOTE — Addendum Note (Signed)
Addended by: Lambert Keto on: 04/22/2023 02:08 PM   Modules accepted: Orders

## 2023-04-24 DIAGNOSIS — Z049 Encounter for examination and observation for unspecified reason: Secondary | ICD-10-CM | POA: Diagnosis not present

## 2023-04-24 DIAGNOSIS — G43719 Chronic migraine without aura, intractable, without status migrainosus: Secondary | ICD-10-CM | POA: Diagnosis not present

## 2023-04-25 NOTE — Progress Notes (Unsigned)
NURSES NOTE:    Pt arrived for his monthly injection of [place]. Pt given injection of [place] 117mg /0.75 ml in his left upper gluteal area. He tolerated well. His last dose was of [place]  was on 03/25/2023. Pt brings his medication [facility] from [facility]. Pt advised to schedule next dose in 28 days.    LOT [id number] EXP 2025-AUG

## 2023-04-29 MED ORDER — PALIPERIDONE PALMITATE ER 117 MG/0.75ML IM SUSY
117.0000 mg | PREFILLED_SYRINGE | Freq: Once | INTRAMUSCULAR | Status: AC
  Administered 2023-03-25: 117 mg via INTRAMUSCULAR

## 2023-04-29 MED ORDER — PALIPERIDONE PALMITATE ER 117 MG/0.75ML IM SUSY
117.0000 mg | PREFILLED_SYRINGE | Freq: Once | INTRAMUSCULAR | Status: AC
  Administered 2023-04-22: 117 mg via INTRAMUSCULAR

## 2023-04-29 NOTE — Addendum Note (Signed)
Addended by: Lambert Keto on: 04/29/2023 09:40 AM   Modules accepted: Orders

## 2023-05-13 ENCOUNTER — Encounter: Payer: Self-pay | Admitting: Psychiatry

## 2023-05-13 ENCOUNTER — Ambulatory Visit (INDEPENDENT_AMBULATORY_CARE_PROVIDER_SITE_OTHER): Payer: BC Managed Care – PPO | Admitting: Psychiatry

## 2023-05-13 DIAGNOSIS — F259 Schizoaffective disorder, unspecified: Secondary | ICD-10-CM

## 2023-05-13 DIAGNOSIS — F4010 Social phobia, unspecified: Secondary | ICD-10-CM

## 2023-05-13 DIAGNOSIS — F411 Generalized anxiety disorder: Secondary | ICD-10-CM | POA: Diagnosis not present

## 2023-05-13 DIAGNOSIS — F5105 Insomnia due to other mental disorder: Secondary | ICD-10-CM

## 2023-05-13 DIAGNOSIS — F515 Nightmare disorder: Secondary | ICD-10-CM

## 2023-05-13 DIAGNOSIS — G4714 Hypersomnia due to medical condition: Secondary | ICD-10-CM

## 2023-05-13 MED ORDER — CLONAZEPAM 0.5 MG PO TABS: ORAL_TABLET | ORAL | 1 refills | Status: DC

## 2023-05-13 NOTE — Progress Notes (Signed)
Scott Vargas [id number] 14-Apr-1996 27 y.o.  Subjective:   Patient ID:  Scott Vargas is a 27 y.o. (DOB 09-09-1996) male.  Chief Complaint:  Chief Complaint  Patient presents with   Follow-up    HPI Scott Vargas presents to the office today for follow-up of schizoaffective disorder with recent hospitalizations. First seen in our office 12/29/2020.  His symptoms were improved but not resolved and included depression, paranoia, intrusive violent images, chronic suicidal thoughts. He was just out of the hospital so no medications were in changed which included Invega IM every 4 weeks, risperidone 2 mg nightly, prazosin 2 mg nightly, trazodone 100 mg nightly, hydroxyzine 50 mg 3 times daily as needed  01/12/2021 appointment with the following noted: Working and going pretty smooth.  Scott Vargas out with friends and it went better than I thought. 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   No pot use since here. Mood pretty chill. Still gets voices negative and positive like "people don't like you".  Voices come and go.  Dealt with it a long time but never told anyone.  Better if busy.   Not sure about character of voices.  They come to me.  Worse when not busy and any time of day.  Voices are frustrating.  Voices got worse July 2020 in sprittual experience.  Not depressed.  Pleased friends accepted him better than expected. Attending young adults group at [facility] which is more centered spiriitually.   Still seeing therapist. Some people bother him but not sure why.  Trying to make new friends. In [place] got 2 restraining orders from 2 different churches.  No outstanding charges. Alcohol 2 -3 per week. Started Depakote without problems.  Pretty well with anger. Sleep OK with mixed type.  NM awaken him once weekly. No SE.  Then with parents [name] & dad Not as depressed as he was. M & F notes increase anxiety as got closer to injection date.  F sees it's "night and day"  Difference and I have  my son back. M agrees he's exhibited bravery in doing difficult things. Last injection date March 12- day outpatient [facility] [place].  Plan: Get next [name] 1 week early by 1 week for a couple of mos DT observed wearing off.  03/07/2021 appointment with the following noted:  Alone and with Mother Last received Invega Sustenna 234 mg per 1.5 mL injection on 02/26/2021.  Last 2 injections have it 3-week intervals instead of typical 4-week intervals bc of ongoing psychotic sx. Doing well and exercising and biking.  Doing ok with voices.  Still hear them somewhat but less prominent and ignoring them more.  Mood pretty calm.  Not lately much fear or anxiety.  Maybe a little better with small talk with people. 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 No problems with meds.  Sometimes lower motivation.    Less hydroxyzine 1/week.   Dreams good and bad still.  Rarely will be awakened by them. No anger outbursts. Mild paranoia out in public about what people are thinking. With Mom:  Superb cp to 2 mos ago.  Some days lacks motivation.  Better on days he works. Plan: Trial L-carnitine 1000 mg 1 twice daily to see if energy and focus are better. Change Invega Sustaina back to every 4 weeks at 234mg  q weeks   05/31/2021 appt noted: Called a couple days ago complaining of more anxiety. Lately with job search and mostly unemployed has a lot of free time and not  feeling good about himself.  Looking for job through [employer]. Sees GF regularly in [place].  Likes the summer. When in groups or around strangers has anxiety but it is improved over the past.  Does not feel as paranoid.  But will feel people are looking at him too much.  Causes anxiety but not anger.  Makes me not want to be around people.Takes hydroxyzine with anxiety and it helps. AH "not so bad".  Dreams can be scary or disturbing.   My mind was hijacked several months ago.  Meds leveled me out.   Taking trazodone at night for  sleep.  Tolerating meds except weight gain from 170 to 210# over the year.  Hungry all the time. Risperidone has not bee taken. A/P: overall psychotic sx improved not resolved hydroxyzine instead of trazodone for sleep to see if dreams are better. Continue [name] back to every 4 weeks at 234mg  q weeks  Add risperidone 1/2 tablet nightly for 1-2 weeks and if no improvement in paranoia and anxiety then increase to 1 nightly  08/13/21 appt noted: alone and with mother Usually sleep good.  Rare NM. Better comfort around people. Went to [employer]. No paranoia.  AH less bothersome. Mainly evident intermittently.  Better than last visit. No depression. SE none except weight.   Energy still kind of low. 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 M sees a lot of improvement this year. Obadiah done well with counseling per pt and M.  She's concerned about his sleep about 12 hour and longer if nothing scheduled.   He says he doesn't have much to do & lacks motivation to exercise as in past.   Plan:  Continue hydroxyzine instead of trazodone for sleep bc dreams are better. Continue Invega Sustaina back to every 4 weeks at 234mg  q weeks DT observed wearing off.   Continue risperidone 1 mg nightly bc paranoia and voices are better.  10/18/21 appt noted: alone and with father Stopped all tablets including Depakote, risperidone bc didn't think he needed them. Thinks he needs hydroxyzine 50 hs for sleep. No violent thoughts off Depakote.   AH about the same or better off risperidone. Se with them he thinks he felt a little question.  Energy is better right now.  Not needing to nap in afternoon.  Still dreams a lot.  NM once weekly.  Rather not take the prazosin right now..   A little social avoidance ongoing but when he does seems ok. Disappointed he didn't get job at [employer].    Not currently working anywhere. No problems with shot except sore for a wek after the shot.   Is video gaming  again some now and wasn't for awhile.  Including complex games. F says invega has been a miracle drug. Plan: Continue [name] back to every 4 weeks at 234mg  q weeks  OK to stop risperidone id not needed for voices.  01/16/2022 appointment with the following noted: Sleep ok off hyrdroxyzine and prazosin without NM. Sleep 10 hours.   No risperidone. No voices but some intrusive thoughts that are not too bad. New job helps his negative thoughts.  Able to manage anger.  But can have thoughts of hurting others that provoke him couple times per week. No paranoid or fearful thoughts.  No acting ou tof anger.   Greater sense of purpose now that FT job.   No depression. Consistent with scheduled injection.   Plan: No med changes  03/18/22 appt noted:  Has been off risperidone since December 2022. Still hear the voices but not focusing on them.  Comes and goes and more when quiet and when busy not as much.  Voices remind him of things and can be positive or negative.  Reminded frequently of relationship struggles and how his mental health affected his relationships.   Not socially active. Busy most of the time with work.  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  Works for [employer].  Scott Vargas is great. No longer has guns.  At workplace does have coworker who yells hat him but learning to tolerate him. Propranolol helps him deal with this at work. Getting counseling through [facility]io Forget.   Sleeps 11-12 hours Weight gain 220.  2 years ago was 170# before psychosis and meds. CO SE weight gain, fatigue, sleepiness with Invega sustena and asksabout alternatives. Asks if he might eventually be able to stop antipsychotic. Plan: start modafinil 100 mg every morning for 1 week and then 200 mg daily for sleepiness.  Continue antipsychotic  05/13/22 appt noted: Has been consistent with monthly Invega Sustenna 234 mg per 1.5 mL IM SE wt gain and sleepy.  Hungry. Thinks invega  dulled excitement to mountain bike and doesn't do it. Started prednisone today for poison ivy. Used 1/2 bottle modafinil and more alert at work.  Able to stay up later.  Uses it prn.  SE HA initially resolved. Sometimes trouble falling asleep.  Still vivid dreams and some NM.  Themes violent or sexual. It really affected him before the hsopital bc thought they were reality.    Now can tell the difference. Work performance affected by weight.  But still doing ok with work. Voices not very noticeable usually.   No sig paranoia but socially isolated and content about that.  Realizes it's not great long term.  Social interaction at work.  Was not confident talking to servers at restaurants and that's fine now. Aware he has trouble making eye contact with people. No longer feels hostile nor aggressive. Plan: Will reduce  [name] every 4 weeks 156 mg q weeks .    08/23/22 noted from nurse: Scott Vargas came in yesterday 08/22/2022 and received a sample supply of Invega Sustenna 156 mg/ml to left upper buttocks IM, tolerated well. Informed him I would discuss with Dr. Jennelle Human and get him scheduled to see him asap.    Pt quit his job on Monday, October 23rd due to a conflict with a co-worker. Pt reports he does have another job, which is a work from home position he will be starting second week in November I believe. He is picking out new insurance that his Dad will pay for. Pt was asking to change medication prior to his issue with work due to weight gain. Pt has been on the lower dose of Invega 156 mg since July and needs follow up with Dr. Jennelle Human now as well to see how he's doing    MD response:  Noted.  We should not make any further med changes until he's seen.  He has an appointment with me in about 5 weeks.  We could consider some change if he wanted to see either Arlys John or Victorino Dike, the nurse practitioners, and then I could discuss the case with them.  But if he wants to see me to make the decision  he will need to keep his appointment with me and we will go from there at that point  10/02/22 appt noted: also with father A little less hunger cravings and a little better energy with less Invega. Felt coworker was harassing him and felt he needed to quit. Was doing well until that week.  Had just gotten a GF at the time he quit.  If married with kids wouldn't have quit but he felt it was a no where job. 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  Has been accepted to the [employer] program. 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  Got financial aid for free school. Lost health insurance with loss of job.  Concerns for cost of Invega.  Applying for pt assistance. Wants firearm for hunting and self defense and target shooting.  Has had a concealed carry permit.  Has had guns for years before. No problems with meds and concerns with meds. No depression and anxiety, nor paranoia, nor agitation.   To bed 9 to 830 AM.  Needs that much sleep right now.    02/12/23 appt noted: aSking to reduce Invega again due to fatigue.  Called a couple of days ago. Hinda Glatter Sustenna dosage was reduced to 117 mg/mL as of 01/21/2023. Notice more energy with reduction and lost 10#.   No increase in anxiety or worry.  Got emotional for no reason talking to someone yesterday.  Overall has good social confidence.  But did have a lot of social anxiety in past when illness was more severe. Wanting to get gun rights restored and will need an attorney.  Was told it would need to be 7 years since IVC.  Wants to stop all meds.  He doesn't think he'll relapse off meds.   Applying for disability bc can't have energy to work outside.   Lives at home.  Parents say he can't stop meds AMA.   Daily HA for several years.  Can function through.  No aura.  No nausea or light sensitivity.  Not disabling.   Plan:  Just reduced [name] every 4 weeks to 117 mg q 4 weeks .   05/13/23 appt noted: Reviewed letter from mother reporting SX  consistent with neg sx schiz but not much positive sx.  Not sleeping as excessively. Met with Dr. Neale Burly over migraine. Asks to take diff med for anxiety.  Likes to be outdoors and in nature but gets stuck in house bc of the heat.  Taking hydroxyzine and it is fine but wants to try something else.  Feels he needs something once daily about mid day to calm me down. Anxieyt and HA go hand in hand.  Get stress out and on edge.  Can turn into panic with high pulse and BP and red face.  Hanging out with GF or F can stress him out.  Usually relational.   Recent Gf ended bc stressing him out.   Psych eval yesterday for disability. Sleep fine.   No weed in over a year and no desire.   Agrees to continue current [place].    Past Psychiatric Medication Trials: History Dr. Krista Blue outpatient [facility] hosp 10/2020 Lorazepam Propranolol Lamotrigine  Invega sustaina started Jan 2022 in hospital [facility] Risperidone 6 Abilify 10 NR and wt gain  Lithium 1200 in 2020 level 0.8 Trazodone, hydroxyzine, prazosin lithium Depakote 1000 started 12/29/20 Modafinil HA  Review of Systems:  Review of Systems  Constitutional:  Positive for fatigue and unexpected weight change.  Cardiovascular:  Negative for chest pain and palpitations.  Neurological:  Positive for headaches. Negative for tremors and weakness.  Psychiatric/Behavioral:  Negative for hallucinations and suicidal ideas. The patient is nervous/anxious.     Medications: I have reviewed the patient's current medications.  Current Outpatient Medications  Medication Sig Dispense Refill   baclofen (LIORESAL) 10 MG tablet Take 10 mg by mouth. Once or twice a week     Cetirizine HCl (ZYRTEC ALLERGY PO) Take by mouth at bedtime. Unsure of dosing. Takes with Benadryl for allergies and sleep.     clonazePAM (KLONOPIN) 0.5 MG tablet Each day midday 30 tablet 1   diphenhydrAMINE (BENADRYL) 25 MG tablet Take 50 mg by mouth at bedtime as needed. Taking for allergies  and a sleep aid     hydrOXYzine (ATARAX) 25 MG tablet TAKE 1-2 TABLETS AT NIGHT FOR SLEEP (Patient taking differently: TAKE 1-2 TABLETS AT NIGHT FOR SLEEP - takes "somewhat") 180 tablet 0   modafinil (PROVIGIL) 200 MG tablet 1/2 table the morning for 1 week then 1 each morning (Patient taking differently: PRN) 30 tablet 1   Paliperidone ER (INVEGA SUSTENNA) injection INJECT 117 MG INTO THE MUSCLE EVERY 30 (THIRTY) DAYS. 0.75 mL 1   propranolol (INDERAL) 20 MG tablet Take 1 tablet (20 mg total) by mouth 2 (two) times daily. 180 tablet 0   zonisamide (ZONEGRAN) 25 MG capsule Take 100 mg by mouth at bedtime.     No current facility-administered medications for this visit.    Medication Side Effects: weight, ? Breast enlargment  Allergies: No Known Allergies  Past Medical History:  Diagnosis Date   Migraine     History reviewed. No pertinent family history.  Social History   Socioeconomic History   Marital status: Single    Spouse name: Not on file   Number of children: Not on file   Years of education: Not on file   Highest education level: Not on file  Occupational History   Not on file  Tobacco Use   Smoking status: Light Smoker   Smokeless tobacco: Never  Substance and Sexual Activity   Alcohol use: Not on file   Drug use: Not on file   Sexual activity: Not on file  Other Topics Concern   Not on file  Social History Narrative   Not on file   Social Determinants of Health   Financial Resource Strain: Medium Risk (10/30/2020)   Received from [facility]   Overall Financial Resource Strain (CARDIA)    Difficulty of Paying Living Expenses: Somewhat hard  Food Insecurity: Not on file  Transportation Needs: Not on file  Physical Activity: Not on file  Stress: Stress Concern Present (10/30/2020)   Received from [facility] of Occupational Health - Occupational Stress Questionnaire    Feeling of Stress : To some extent  Social Connections: Unknown  (03/10/2022)   Received from [facility]   Social Network    Social Network: Not on file  Intimate Partner Violence: Unknown (01/31/2022)   Received from [facility]   HITS    Physically Hurt: Not on file    Insult or Talk Down To: Not on file    Threaten Physical Harm: Not on file    Scream or Curse: Not on file    Past Medical History, Surgical history, Social history, and Family history were reviewed and updated as appropriate.   Fraternal twin brother 2 other brothers  Please see review of systems for further details on the patient's review from today.   Objective:   Physical Exam:  There were no vitals taken for  this visit.  Physical Exam Constitutional:      General: He is not in acute distress. Musculoskeletal:        General: No deformity.  Neurological:     Mental Status: He is alert and oriented to person, place, and time.     Coordination: Coordination normal.  Psychiatric:        Attention and Perception: He is attentive. He does not perceive auditory or visual hallucinations.        Mood and Affect: Mood is anxious. Mood is not depressed. Affect is not labile, blunt, angry or inappropriate.        Speech: Speech normal.        Behavior: Behavior normal.        Thought Content: Thought content is not paranoid or delusional. Thought content does not include homicidal or suicidal ideation. Thought content does not include suicidal plan.        Cognition and Memory: Cognition and memory normal.        Judgment: Judgment normal.     Comments: Insight intact Mild paranoia resolved ongoing discomfort around people..  No HI or intent. Limited eye contact but  pleasant. Some dep but fights it.      Lab Review:     Component Value Date/Time   NA 138 08/25/2020 1522   K 4.1 08/25/2020 1522   CL 101 08/25/2020 1522   CO2 26 08/25/2020 1522   GLUCOSE 95 08/25/2020 1522   BUN 12 08/25/2020 1522   CREATININE 0.92 08/25/2020 1522   CALCIUM 9.1 08/25/2020 1522    PROT 7.0 08/25/2020 1522   ALBUMIN 4.2 08/25/2020 1522   AST 21 08/25/2020 1522   ALT 17 08/25/2020 1522   ALKPHOS 59 08/25/2020 1522   BILITOT 0.7 08/25/2020 1522   GFRNONAA >60 08/25/2020 1522       Component Value Date/Time   WBC 8.2 08/25/2020 1522   RBC 5.20 08/25/2020 1522   HGB 14.8 08/25/2020 1522   HCT 45.6 08/25/2020 1522   PLT 293 08/25/2020 1522   MCV 87.7 08/25/2020 1522   MCH 28.5 08/25/2020 1522   MCHC 32.5 08/25/2020 1522   RDW 12.1 08/25/2020 1522    No results found for: "POCLITH", "LITHIUM"   No results found for: "PHENYTOIN", "PHENOBARB", "VALPROATE", "CBMZ"   Normal Head CT scan.  They asked whether further neuro work-up was indicated and there does not appear to be focal neurologic signs or symptoms to suggest that.  Genesight testing disc in detail.  Disc Mother's concerns with his lack of motivation and energy and lack of self direction.  .res Assessment: Plan:    [place] was seen today for follow-up.  Diagnoses and all orders for this visit:  Schizoaffective disorder, unspecified type (HCC) -     clonazePAM (KLONOPIN) 0.5 MG tablet; Each day midday  Generalized anxiety disorder -     clonazePAM (KLONOPIN) 0.5 MG tablet; Each day midday  Social anxiety disorder -     clonazePAM (KLONOPIN) 0.5 MG tablet; Each day midday  Nightmare  Hypersomnolence disorder, with medical condition, persistent, moderate  Insomnia due to mental condition   Headaches   30 min face to face time with patient  He has not had any problems with dose reduction in [place] from 156 to 117 mg as of 01/21/2023.Marland Kitchen  Psychotic sx still under control with still sig reduced eye contract but is able to make eye contact.   We discussed the importance of med compliance and  how that can change future outcomes with psychotic symptoms as well as relapse risk.  We discussed side effects of antipsychotic medications.   Overall his psychotic symptoms are generally  resolved..still neg sx of schizophrenia .  No hostility   Continue counseling with Elio Forget  and work on Pharmacist, community and self care .  That is helpful  Support groups [facility], Kellen, NAMI.  Going to [employer] support group by Zoom a couple of times.  Faith based.  Since March 2024 [name] every 4 weeks to 117 mg q 4 weeks .  We discussed that it can take several months for long-acting injectables to have optimal effect and reach steady state.  His psychosis is managed but is still sleeping a good bit.  It is too early to consider another dose reduction.  Extensive discussion around my opinion that he has a version of schizophrenia that is highly likely to recur if he stops all antipsychotic.  In addition if he has recurrence of psychosis it is not guarantee that he will be able to recover as fully after such an episode as he is currently.  Therefore I will not agree to stopping antipsychotic at any time in the foreseeable future.  We could discuss options of switching to a less sedating antipsychotic.  But my recommendation would be to gradually reduce the 1 he is on rather than switch to a different antipsychotic because we know that Hinda Glatter has been effective.We had an extensive discussion about the current side effects of antipsychotics that he is experiencing and the potential for switch but that is very risky.  Conisder checking prolactin  Ok clonazepam in place of hydroxyzine for anxiety midday 0.5 mg daily.  Consider alternatives DT SE Vraylar, Latuda, Caplyta.    Propranolol 20-40 mg before work to deal with anxiety and neg thoughts with stressful coworker.  Can take it regularly to help prevent HA.    Disc dietary issues and risk weight gain.  Discussed potential metabolic side effects associated with atypical antipsychotics, as well as potential risk for movement side effects. Advised pt to contact office if movement side effects occur.  Option metformin, he declined    Tobacco use only rarely bc DDI.  Avoid THC.   Encourage exercise and stimulation.  I support his pursuit of disability DT residual negative sx of schizophrenia and fatigue DT meds interfering with his ability to work outside.    FU 12 weeks  Meredith Staggers, MD, DFAPA   Please see After Visit Summary for patient specific instructions.  Future Appointments  Date Time Provider Department Center  05/20/2023  3:00 PM Waldron Session, [facility] CP-CP None  05/20/2023  4:00 PM CP-NURSE CP-CP None  06/19/2023  2:30 PM Scroggins, Elouise Munroe, LPN CP-CP None  06/19/2023  3:00 PM Waldron Session, [facility] CP-CP None    No orders of the defined types were placed in this encounter.    -------------------------------

## 2023-05-15 ENCOUNTER — Other Ambulatory Visit: Payer: Self-pay | Admitting: Psychiatry

## 2023-05-15 DIAGNOSIS — F259 Schizoaffective disorder, unspecified: Secondary | ICD-10-CM

## 2023-05-19 DIAGNOSIS — G43809 Other migraine, not intractable, without status migrainosus: Secondary | ICD-10-CM | POA: Diagnosis not present

## 2023-05-19 DIAGNOSIS — F2081 Schizophreniform disorder: Secondary | ICD-10-CM | POA: Diagnosis not present

## 2023-05-19 DIAGNOSIS — F5101 Primary insomnia: Secondary | ICD-10-CM | POA: Diagnosis not present

## 2023-05-19 DIAGNOSIS — Z8342 Family history of familial hypercholesterolemia: Secondary | ICD-10-CM | POA: Diagnosis not present

## 2023-05-19 DIAGNOSIS — I10 Essential (primary) hypertension: Secondary | ICD-10-CM | POA: Diagnosis not present

## 2023-05-20 ENCOUNTER — Ambulatory Visit: Payer: BC Managed Care – PPO | Admitting: Mental Health

## 2023-05-20 ENCOUNTER — Ambulatory Visit (INDEPENDENT_AMBULATORY_CARE_PROVIDER_SITE_OTHER): Payer: BC Managed Care – PPO | Admitting: Mental Health

## 2023-05-20 ENCOUNTER — Ambulatory Visit (INDEPENDENT_AMBULATORY_CARE_PROVIDER_SITE_OTHER): Payer: BC Managed Care – PPO

## 2023-05-20 DIAGNOSIS — F259 Schizoaffective disorder, unspecified: Secondary | ICD-10-CM | POA: Diagnosis not present

## 2023-05-20 MED ORDER — PALIPERIDONE PALMITATE ER 117 MG/0.75ML IM SUSY
117.0000 mg | PREFILLED_SYRINGE | Freq: Once | INTRAMUSCULAR | Status: AC
Start: 2023-05-20 — End: 2023-05-20
  Administered 2023-05-20: 117 mg via INTRAMUSCULAR

## 2023-05-20 NOTE — Progress Notes (Signed)
[facility] Psychotherapy Note  Name: Alby Schwabe Date: 05/20/23 MRN: [id number] DOB: 29-Jan-1996 PCP: Noberto Retort, MD   Virtual Visit via Telehealth Note Connected with patient by a telemedicine/telehealth application, with their informed consent, and verified patient privacy and that I am speaking with the correct person using two identifiers. I discussed the limitations, risks, security and privacy concerns of performing psychotherapy and the availability of in person appointments. I also discussed with the patient that there may be a patient responsible charge related to this service. The patient expressed understanding and agreed to proceed. I discussed the treatment planning with the patient. The patient was provided an opportunity to ask questions and all were answered. The patient agreed with the plan and demonstrated an understanding of the instructions. The patient was advised to call  our office if  symptoms worsen or feel they are in a crisis state and need immediate contact.   Therapist Location: office Patient Location: home    Time spent: 21 minutes  Treatment:  individual therapy  Mental Status Exam:    Appearance:    Casual     Behavior:   Appropriate  Motor:   WNL  Speech/Language:    Clear and Coherent  Affect:   Full range   Mood:   Euthymic  Thought process:   normal  Thought content:     WNL  Sensory/Perceptual disturbances:     none  Orientation:   x4  Attention:   Good  Concentration:   Good  Memory:   Intact  Fund of knowledge:    Consistent with age and development  Insight:     Good  Judgment:    Good  Impulse Control:   Good     Reported Symptoms:  anxiety, depression, rumination, avoidant behavior (crowds at times)  Risk Assessment: Danger to Self:  No Self-injurious Behavior: No Danger to Others: No Duty to Warn:no Physical Aggression / Violence:No  Access to Firearms a concern: No  Gang Involvement:No  Patient / guardian  was educated about steps to take if suicide or homicide risk level increases between visits: yes While future psychiatric events cannot be accurately predicted, the patient does not currently require acute inpatient psychiatric care and does not currently meet [facility] involuntary commitment criteria.   Diagnoses:    ICD-10-CM   1. Schizoaffective disorder, unspecified type (HCC)  F25.9           Subjective:   Patient engaged in telehealth session via telephone. His prescribed a med for anxiety (Clonazpam) makes him feel he drank etoh- relaxed but not buzzed. This is an improvement from the last medication as he feels is helping with the anxiety. When around others or he's at home and can't leave  due to weather, he feels anxious. He realized he is coping with anxiety and a history of panic attacks.  He shared how he now realizes he probably has been dealing with anxiety for the last several years. Talking to a new girl, she also copes with anxiety so he is unsure at this point how the relationship might develop.  He continues to have strong support from his family, reports they plan on going on a vacation together however his mother has decided not to go.  Facilitated his identifying ways to cope and care for himself where he plans to continue to take his medications, get adequate rest and continue to try and lose weight which he has been able to do recently losing about 15  pounds over the last couple of months.   Interventions:   Motivational interviewing, supportive therapy  Plan: Patient is to use CBT, mindfulness and coping skills to help decrease symptoms associated with their diagnosis.  Patient continues to embrace ways to take steps toward his independence and self acceptance.    Long-term goal:   Reduce overall level, frequency, and intensity of the feelings of anxiety in social situations from every occurrence causing anxiety to 1 our of 5 occurrences.  Decrease feelings of  sadness and anxiety related to life stressors.   Short-term goal:  Patient to continue to work on identifying thoughts that make his anxiety increased in social situations and to continue to allow himself to engage in social situations to continue to try and work past his anxiety.   Patient to ground himself with rational, calming self talk when having intrusive thoughts. Increased feelings of self-worth by taking steps toward achieving more independence by finding and maintaining employment    Assessment of progress:  progressing   This record has been created using [employer].  Chart creation errors have been sought, but may not always have been located and corrected. Such creation errors do not reflect on the standard of medical care.     Waldron Session, [facility]

## 2023-05-20 NOTE — Progress Notes (Signed)
NURSES NOTE:    Pt arrived for his monthly LAI of Invega Sustennas. Pt given injection of [place] 117mg /0.75 ml in his left upper gluteal area. He tolerated well. His last dose was of [place]  was on 04/22/2023. Pt brings his medication with him from [facility]. Pt advised to schedule next dose in 28 days.  Pt reports he was started on Clonazepam 0.5 mg takes one tablet at noon. He reports it is effective but would like to increase dose or add something else. Informed him Dr. Jennelle Human couldn't add Xanax or another benzo but might be able to have pt take dose twice a day. He reports if he takes clonazepam it does help his migraines so he's thinking it might be more anxiety and not migraines. His neurologist/headache doctor gave him Baclofen but it makes him too relaxed and has trouble walking when taking. Informed him I would discuss with Dr. Jennelle Human and give him a call back.    LOT [id number] EXP 2025-NOV

## 2023-05-21 ENCOUNTER — Telehealth: Payer: Self-pay | Admitting: Psychiatry

## 2023-05-21 NOTE — Telephone Encounter (Signed)
Pt LVM @ 3:54p asking he if can take another dose of Clonazepam daily.   Next appt 10/16

## 2023-05-21 NOTE — Telephone Encounter (Signed)
After discussing with Dr. Jennelle Human he does not want him to increase dose at this time, it's only been a week and he wants pt to give it longer. Will let Trever know.

## 2023-05-21 NOTE — Telephone Encounter (Signed)
Josealberto reports he will continue taking around noon and if needed will take hydroxyzine in the pm.

## 2023-05-26 ENCOUNTER — Telehealth: Payer: Self-pay | Admitting: Psychiatry

## 2023-05-26 NOTE — Telephone Encounter (Signed)
Patient had called 7/24 about this and he was told by Traci that he needed to give it more time. Reiterated the same info to him today. He thought he was supposed to give it a week.

## 2023-05-26 NOTE — Telephone Encounter (Signed)
Scott Vargas called at 3:45 to request that his Clonazepam be increased from 1/day to 2/day.  Please call to discuss.

## 2023-05-30 NOTE — Telephone Encounter (Signed)
Patient lvm again today requesting to increase his anxiety medication.

## 2023-06-02 DIAGNOSIS — G43719 Chronic migraine without aura, intractable, without status migrainosus: Secondary | ICD-10-CM | POA: Diagnosis not present

## 2023-06-02 DIAGNOSIS — R739 Hyperglycemia, unspecified: Secondary | ICD-10-CM | POA: Diagnosis not present

## 2023-06-02 NOTE — Telephone Encounter (Signed)
Told him I would have Traci call him, out of the office today.

## 2023-06-02 NOTE — Telephone Encounter (Signed)
Patient called back regarding previous messages and asked if he could start taking Clonazepam.Ph: [phone]

## 2023-06-02 NOTE — Telephone Encounter (Signed)
Patient has called multiple times asking to increase clonazepam. I know you have had a conversation with him about clonazepam.

## 2023-06-03 ENCOUNTER — Other Ambulatory Visit: Payer: Self-pay | Admitting: Psychiatry

## 2023-06-03 DIAGNOSIS — F4010 Social phobia, unspecified: Secondary | ICD-10-CM

## 2023-06-03 DIAGNOSIS — F259 Schizoaffective disorder, unspecified: Secondary | ICD-10-CM

## 2023-06-03 DIAGNOSIS — F411 Generalized anxiety disorder: Secondary | ICD-10-CM

## 2023-06-03 MED ORDER — CLONAZEPAM 0.5 MG PO TABS
0.5000 mg | ORAL_TABLET | Freq: Two times a day (BID) | ORAL | 0 refills | Status: DC
Start: 2023-06-03 — End: 2023-07-22

## 2023-06-03 NOTE — Telephone Encounter (Signed)
Rtc to patient and he reports just finding out he has diabetes, his glucose was well in the 200's. He just started Metformin today and will take one daily. I did discuss GI issues in case he wasn't aware. I discussed that once his BS level and Hgb A1C is back in range and improving he will feel much better mentally and physically. He agreed and was appreciative of information. He reports feeling anxious when he wakes up request to take first Clonazepam 0.5 mg around 9 am and it is effective for 6 hours and would take another dose around 3-4 pm. He didn't need any additional because he sleeps for 10-11 hours already. He is more anxious with his new diagnosis but assured him that watching his diet and exercising along with his metformin will improve that and he may not need Clonazepam twice daily once he starts feeling better. He agreed.   Informed him I would let Dr. Jennelle Human know and give him a call back.

## 2023-06-03 NOTE — Telephone Encounter (Signed)
History about the nature of the anxiety.  When he is having the anxiety during the day?  He told me it was mid day so I gave him 0.5 Klonopin to take in the middle of the day.  How is he taking it?  Is it helping?  Is it wearing off?  Why does he feel it needs to go higher.?

## 2023-06-03 NOTE — Telephone Encounter (Signed)
Call and move his appt up to next available.

## 2023-06-03 NOTE — Telephone Encounter (Signed)
Pt has called multiple times while I was out of office asking for dose increase of Clonazepam.  Please review messages.

## 2023-06-03 NOTE — Telephone Encounter (Signed)
Scott Vargas called again about increasing Clonazepam. He is very anxious and has just found out he has diabetes. He wanted me to relay this to CC and clinical staff and he thinks that is why he has been experiencing headaches. Pls rtc [phone]

## 2023-06-03 NOTE — Telephone Encounter (Signed)
Ok.  I'll send in rX clonazepam 0.5 mg BID.  Thank you.

## 2023-06-04 NOTE — Telephone Encounter (Signed)
Left voicemail with dose increase and to check with his pharmacy.

## 2023-06-09 ENCOUNTER — Telehealth: Payer: Self-pay | Admitting: Psychiatry

## 2023-06-09 DIAGNOSIS — E1165 Type 2 diabetes mellitus with hyperglycemia: Secondary | ICD-10-CM | POA: Diagnosis not present

## 2023-06-09 NOTE — Telephone Encounter (Signed)
Pt called at 1:56p stating that he was recently does as diabetic.  He said yesterday non-fasting, it was 310 and his A1C is 11.7%.  He said he read in some folks the [place] can cause the diabetes.  He's wondering if he can get put on a different medication.  Next appt 10/16

## 2023-06-09 NOTE — Telephone Encounter (Signed)
Patient notified

## 2023-06-09 NOTE — Telephone Encounter (Signed)
Please see message from patient

## 2023-06-09 NOTE — Telephone Encounter (Signed)
All antipsychotics have the same warning about potential DM.  I need to see him at an appt before we can talk about changes.  This would not be easy to do and it has a lot of risk if we change but we can talk about it.  He can try to move up appt if he can.

## 2023-06-09 NOTE — Telephone Encounter (Signed)
Please put patient on cancellation list.

## 2023-06-11 ENCOUNTER — Ambulatory Visit: Payer: BC Managed Care – PPO | Admitting: Psychiatry

## 2023-06-19 ENCOUNTER — Ambulatory Visit (INDEPENDENT_AMBULATORY_CARE_PROVIDER_SITE_OTHER): Payer: BC Managed Care – PPO

## 2023-06-19 ENCOUNTER — Ambulatory Visit (INDEPENDENT_AMBULATORY_CARE_PROVIDER_SITE_OTHER): Payer: BC Managed Care – PPO | Admitting: Mental Health

## 2023-06-19 DIAGNOSIS — F259 Schizoaffective disorder, unspecified: Secondary | ICD-10-CM

## 2023-06-19 MED ORDER — PALIPERIDONE PALMITATE ER 117 MG/0.75ML IM SUSY
117.0000 mg | PREFILLED_SYRINGE | Freq: Once | INTRAMUSCULAR | Status: AC
Start: 2023-06-19 — End: 2023-06-19
  Administered 2023-06-19: 117 mg via INTRAMUSCULAR

## 2023-06-19 NOTE — Progress Notes (Signed)
[facility] Psychotherapy Note  Name: Scott Vargas Date: 06/19/23 MRN: [id number] DOB: [birth year] PCP: Noberto Retort, MD    Time spent: 45 minutes  Treatment:  individual therapy  Mental Status Exam:    Appearance:    Casual     Behavior:   Appropriate  Motor:   WNL  Speech/Language:    Clear and Coherent  Affect:   Full range   Mood:   Euthymic  Thought process:   normal  Thought content:     WNL  Sensory/Perceptual disturbances:     none  Orientation:   x4  Attention:   Good  Concentration:   Good  Memory:   Intact  Fund of knowledge:    Consistent with age and development  Insight:     Good  Judgment:    Good  Impulse Control:   Good     Reported Symptoms:  anxiety, depression, rumination, avoidant behavior (crowds at times)  Risk Assessment: Danger to Self:  No Self-injurious Behavior: No Danger to Others: No Duty to Warn:no Physical Aggression / Violence:No  Access to Firearms a concern: No  Gang Involvement:No  Patient / guardian was educated about steps to take if suicide or homicide risk level increases between visits: yes While future psychiatric events cannot be accurately predicted, the patient does not currently require acute inpatient psychiatric care and does not currently meet [facility] involuntary commitment criteria.   Diagnoses:    ICD-10-CM   1. Schizoaffective disorder, unspecified type (HCC)  F25.9            Subjective:   Patient presented on time for today's session.  Assessed progress where he shared he has been having no symptoms associated with his diagnosis, denies any ruminations, denies any psychosis.  He continues to take his medication as prescribed.  He reports getting some disappointing news recently after his physical where he was diagnosed with type 2 diabetes.  Since that time he has worked to change his diet and is trying to integrate exercise.  He reports it contributing to his feeling down, somewhat  depressed but denies it being severe.  He identified the need to make more friendships locally, having some friendships recently primarily on line through gaming which he values.  Assess family relationships where he stated his parents continue to have some marital strain however, he stated that the relationship with his mother is improving as they are talking more frequently.  He looks forward to taking a family vacation later next month.  Provide support and encouragement for patient to focus on self-care as identified in session where he plans to follow through with a workout regimen and consistently integrating some walking exercise daily.   Interventions:   Motivational interviewing, supportive therapy  Plan: Patient is to use CBT, mindfulness and coping skills to help decrease symptoms associated with their diagnosis.  Patient continues to embrace ways to take steps toward his independence and self acceptance.    Long-term goal:   Reduce overall level, frequency, and intensity of the feelings of anxiety in social situations from every occurrence causing anxiety to 1 our of 5 occurrences.  Decrease feelings of sadness and anxiety related to life stressors.   Short-term goal:  Patient to continue to work on identifying thoughts that make his anxiety increased in social situations and to continue to allow himself to engage in social situations to continue to try and work past his anxiety.   Patient to ground himself with rational, calming  self talk when having intrusive thoughts. Increased feelings of self-worth by taking steps toward achieving more independence by finding and maintaining employment    Assessment of progress:  progressing   This record has been created using [employer].  Chart creation errors have been sought, but may not always have been located and corrected. Such creation errors do not reflect on the standard of medical care.     Waldron Session, [facility]

## 2023-06-19 NOTE — Progress Notes (Signed)
NURSES NOTE:    Pt arrived for his monthly LAI of Invega Sustennas. Pt given injection of [place] 117mg /0.75 ml in his left upper gluteal area. He tolerated well. His last dose was of [place]  was on 05/20/2023. Pt [facility] medication [facility] from [facility]. Pt advised to schedule next dose in 28 days. Pt has apt with [facility] today for counseling as well. Pt discussed his diagnosis of diabetes and the medication he's on for that, his diet changes, and trying to manage all that.      LOT [id number] EXP 2025-NOV

## 2023-07-21 ENCOUNTER — Other Ambulatory Visit: Payer: Self-pay | Admitting: Psychiatry

## 2023-07-21 DIAGNOSIS — F411 Generalized anxiety disorder: Secondary | ICD-10-CM

## 2023-07-21 DIAGNOSIS — F259 Schizoaffective disorder, unspecified: Secondary | ICD-10-CM

## 2023-07-21 DIAGNOSIS — F4010 Social phobia, unspecified: Secondary | ICD-10-CM

## 2023-07-23 ENCOUNTER — Ambulatory Visit (INDEPENDENT_AMBULATORY_CARE_PROVIDER_SITE_OTHER): Payer: BC Managed Care – PPO

## 2023-07-23 ENCOUNTER — Ambulatory Visit: Payer: BC Managed Care – PPO | Admitting: Mental Health

## 2023-07-23 DIAGNOSIS — F259 Schizoaffective disorder, unspecified: Secondary | ICD-10-CM

## 2023-07-23 NOTE — Progress Notes (Signed)
[facility] Psychotherapy Note  Name: Scott Vargas Date: 07/23/23 MRN: [id number] DOB: 1996/04/03 PCP: Noberto Retort, MD    Time spent: 46 minutes  Treatment:  individual therapy  Mental Status Exam:    Appearance:    Casual     Behavior:   Appropriate  Motor:   WNL  Speech/Language:    Clear and Coherent  Affect:   Full range   Mood:   Euthymic  Thought process:   normal  Thought content:     WNL  Sensory/Perceptual disturbances:     none  Orientation:   x4  Attention:   Good  Concentration:   Good  Memory:   Intact  Fund of knowledge:    Consistent with age and development  Insight:     Good  Judgment:    Good  Impulse Control:   Good     Reported Symptoms:  anxiety, depression, rumination, avoidant behavior (crowds at times)  Risk Assessment: Danger to Self:  No Self-injurious Behavior: No Danger to Others: No Duty to Warn:no Physical Aggression / Violence:No  Access to Firearms a concern: No  Gang Involvement:No  Patient / guardian was educated about steps to take if suicide or homicide risk level increases between visits: yes While future psychiatric events cannot be accurately predicted, the patient does not currently require acute inpatient psychiatric care and does not currently meet [facility] involuntary commitment criteria.   Diagnoses:    ICD-10-CM   1. Schizoaffective disorder, unspecified type (HCC)  F25.9         Subjective:   Patient presented on time for today's session.  Assessed progress.  Patient continues to report responding well to his psychiatric medication, monthly injection.  He reports due to a lower dose he has had some reduction in weight which he is glad to report.  He continues to express wanting to lose more weight, states that he is walking about 15 minutes/day.  Explored with patient ways to continue to make progress in this area where he plans to follow through with looking at more options such as engaging in  more exercises and creating a regimen for consistency.  Explored his social interactions with others, anxiety.  He stated that he has been home often, went on 1 date recently but has not socialized with other friends, has been engaging in other interests.  Explored family relationships where he identified some family strain going on to share some details.  Through further guided discovery, he identified increased feelings of self-confidence over the past couple of months providing some associated self supportive thoughts.  Provide support and encouragement for patient to continue self-care and follow-up with doctor appointments.  Interventions:   Motivational interviewing, supportive therapy  Plan: Patient is to use CBT, mindfulness and coping skills to help decrease symptoms associated with their diagnosis.  Patient continues to embrace ways to take steps toward his independence and self acceptance.    Long-term goal:   Reduce overall level, frequency, and intensity of the feelings of anxiety in social situations from every occurrence causing anxiety to 1 our of 5 occurrences.  Decrease feelings of sadness and anxiety related to life stressors.   Short-term goal:  Patient to continue to work on identifying thoughts that make his anxiety increased in social situations and to continue to allow himself to engage in social situations to continue to try and work past his anxiety.   Patient to ground himself with rational, calming self talk when having intrusive  thoughts. Increased feelings of self-worth by taking steps toward achieving more independence by finding and maintaining employment    Assessment of progress:  progressing   This record has been created using [employer].  Chart creation errors have been sought, but may not always have been located and corrected. Such creation errors do not reflect on the standard of medical care.     Waldron Session, [facility]

## 2023-07-28 ENCOUNTER — Telehealth: Payer: Self-pay | Admitting: Psychiatry

## 2023-07-28 MED ORDER — PALIPERIDONE PALMITATE ER 117 MG/0.75ML IM SUSY
117.0000 mg | PREFILLED_SYRINGE | INTRAMUSCULAR | Status: DC
Start: 2023-07-28 — End: 2023-11-24
  Administered 2023-07-23 – 2023-10-20 (×4): 117 mg via INTRAMUSCULAR

## 2023-07-28 NOTE — Telephone Encounter (Signed)
I googled and do see where it was recently documented that it had been approved, but I don't see where it is available to be prescribed yet. Not able to access in Epic. Cobenfy, also known as KarXT.

## 2023-07-28 NOTE — Telephone Encounter (Signed)
Scott Vargas called at 10:00 to report that FDA has approved a new drug - Cobenify - for schizophrenia.  He would like to know if you would consider prescribing it for him.  Next appt 10/16.

## 2023-07-28 NOTE — Telephone Encounter (Signed)
It was just FDA approved.  I expect to be learning about it soon and I expect we will have a rep and samples in the next few weeks.  I don't know enough about it to RX it yet.  I see him in a couple of weeks.  I'll let him know if I have learned anything new then

## 2023-07-28 NOTE — Progress Notes (Signed)
NURSES NOTE:    Pt arrived for his monthly LAI of Invega Sustennas. Pt given injection of [place] 117mg /0.75 ml in his left upper gluteal area. He tolerated well. His last dose was of [place]  was on 05/20/2023. Pt [facility]. Pt already scheduled his next injection on 08/21/2023. Pt next apt with Dr. Jennelle Human is 08/13/2023. Pt has lost #20 lbs since being diagnosed with diabetes and starting new medication along with his diet. Pt looking better and seems to be feeling better as well. Also has apt with his counselor Elio Forget after receiving injection.      LOT [id number] EXP NOV 2025 NDC [id number]

## 2023-07-28 NOTE — Telephone Encounter (Signed)
Patient notified

## 2023-08-13 ENCOUNTER — Telehealth (INDEPENDENT_AMBULATORY_CARE_PROVIDER_SITE_OTHER): Payer: BC Managed Care – PPO | Admitting: Psychiatry

## 2023-08-13 ENCOUNTER — Encounter: Payer: Self-pay | Admitting: Psychiatry

## 2023-08-13 ENCOUNTER — Other Ambulatory Visit: Payer: Self-pay | Admitting: Psychiatry

## 2023-08-13 DIAGNOSIS — G4714 Hypersomnia due to medical condition: Secondary | ICD-10-CM

## 2023-08-13 DIAGNOSIS — F4010 Social phobia, unspecified: Secondary | ICD-10-CM

## 2023-08-13 DIAGNOSIS — F515 Nightmare disorder: Secondary | ICD-10-CM

## 2023-08-13 DIAGNOSIS — F259 Schizoaffective disorder, unspecified: Secondary | ICD-10-CM

## 2023-08-13 DIAGNOSIS — F411 Generalized anxiety disorder: Secondary | ICD-10-CM | POA: Diagnosis not present

## 2023-08-13 DIAGNOSIS — F5105 Insomnia due to other mental disorder: Secondary | ICD-10-CM

## 2023-08-13 NOTE — Progress Notes (Signed)
Scott Vargas [id number] 1996-09-03 27 y.o.  Video Visit via My Chart  I connected with pt by video using My Chart and verified that I am speaking with the correct person using two identifiers.   I discussed the limitations, risks, security and privacy concerns of performing an evaluation and management service by My Chart  and the availability of in person appointments. I also discussed with the patient that there may be a patient responsible charge related to this service. The patient expressed understanding and agreed to proceed.  I discussed the assessment and treatment plan with the patient. The patient was provided an opportunity to ask questions and all were answered. The patient agreed with the plan and demonstrated an understanding of the instructions.   The patient was advised to call back or seek an in-person evaluation if the symptoms worsen or if the condition fails to improve as anticipated.  I provided 30 minutes of video time during this encounter.  The patient was located at home and the provider was located office. Session 230-300  Subjective:   Patient ID:  Scott Vargas is a 27 y.o. (DOB 1996-08-02) male.  Chief Complaint:  Chief Complaint  Patient presents with   Follow-up    HPI Scott Vargas presents to the office today for follow-up of schizoaffective disorder with recent hospitalizations. First seen in our office 12/29/2020.  His symptoms were improved but not resolved and included depression, paranoia, intrusive violent images, chronic suicidal thoughts. He was just out of the hospital so no medications were in changed which included Invega IM every 4 weeks, risperidone 2 mg nightly, prazosin 2 mg nightly, trazodone 100 mg nightly, hydroxyzine 50 mg 3 times daily as needed  01/12/2021 appointment with the following noted: Working and going pretty smooth.  [name] out with friends and it went better than I thought. 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   No pot use  since here. Mood pretty chill. Still gets voices negative and positive like "people don't like you".  Voices come and go.  Dealt with it a long time but never told anyone.  Better if busy.   Not sure about character of voices.  They come to me.  Worse when not busy and any time of day.  Voices are frustrating.  Voices got worse July 2020 in sprittual experience.  Not depressed.  Pleased friends accepted him better than expected. Attending young adults group at [facility] which is more centered spiriitually.   Still seeing therapist. Some people bother him but not sure why.  Trying to make new friends. In [place] got 2 restraining orders from 2 different churches.  No outstanding charges. Alcohol 2 -3 per week. Started Depakote without problems.  Pretty well with anger. Sleep OK with mixed type.  NM awaken him once weekly. No SE.  Then with parents [name] & dad Not as depressed as he was. M & F notes increase anxiety as got closer to injection date.  F sees it's "night and day"  Difference and I have my son back. M agrees he's exhibited bravery in doing difficult things. Last injection date March 12- day outpatient [facility] [place] [place].  Plan: Get next [name] 1 week early by 1 week for a couple of mos DT observed wearing off.  03/07/2021 appointment with the following noted:  Alone and with Mother Last received Invega Sustenna 234 mg per 1.5 mL injection on 02/26/2021.  Last 2 injections have it 3-week intervals instead of typical 4-week intervals  bc of ongoing psychotic sx. Doing well and exercising and biking.  Doing ok with voices.  Still hear them somewhat but less prominent and ignoring them more.  Mood pretty calm.  Not lately much fear or anxiety.  Maybe a little better with small talk with people. 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 No problems with meds.  Sometimes lower motivation.    Less hydroxyzine 1/week.   Dreams good and bad still.  Rarely will be  awakened by them. No anger outbursts. Mild paranoia out in public about what people are thinking. With Mom:  Superb cp to 2 mos ago.  Some days lacks motivation.  Better on days he works. Plan: Trial L-carnitine 1000 mg 1 twice daily to see if energy and focus are better. Change Invega Sustaina back to every 4 weeks at 234mg  q weeks   05/31/2021 appt noted: Called a couple days ago complaining of more anxiety. Lately with job search and mostly unemployed has a lot of free time and not feeling good about himself.  Looking for job through [employer]. Sees GF regularly in [place].  Likes the summer. When in groups or around strangers has anxiety but it is improved over the past.  Does not feel as paranoid.  But will feel people are looking at him too much.  Causes anxiety but not anger.  Makes me not want to be around people.Takes hydroxyzine with anxiety and it helps. AH "not so bad".  Dreams can be scary or disturbing.   My mind was hijacked several months ago.  Meds leveled me out.   Taking trazodone at night for sleep.  Tolerating meds except weight gain from 170 to 210# over the year.  Hungry all the time. Risperidone has not bee taken. A/P: overall psychotic sx improved not resolved hydroxyzine instead of trazodone for sleep to see if dreams are better. Continue [name] back to every 4 weeks at 234mg  q weeks  Add risperidone 1/2 tablet nightly for 1-2 weeks and if no improvement in paranoia and anxiety then increase to 1 nightly  08/13/21 appt noted: alone and with mother Usually sleep good.  Rare NM. Better comfort around people. Went to [employer]. No paranoia.  AH less bothersome. Mainly evident intermittently.  Better than last visit. No depression. SE none except weight.   Energy still kind of low. 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 M sees a lot of improvement this year. Kalib done well with counseling per pt and M.  She's concerned about his sleep about 12 hour and  longer if nothing scheduled.   He says he doesn't have much to do & lacks motivation to exercise as in past.   Plan:  Continue hydroxyzine instead of trazodone for sleep bc dreams are better. Continue Invega Sustaina back to every 4 weeks at 234mg  q weeks DT observed wearing off.   Continue risperidone 1 mg nightly bc paranoia and voices are better.  10/18/21 appt noted: alone and with father Stopped all tablets including Depakote, risperidone bc didn't think he needed them. Thinks he needs hydroxyzine 50 hs for sleep. No violent thoughts off Depakote.   AH about the same or better off risperidone. Se with them he thinks he felt a little question.  Energy is better right now.  Not needing to nap in afternoon.  Still dreams a lot.  NM once weekly.  Rather not take the prazosin right now..   A little social avoidance ongoing but when he  does seems ok. Disappointed he didn't get job at [employer].    Not currently working anywhere. No problems with shot except sore for a wek after the shot.   Is video gaming again some now and wasn't for awhile.  Including complex games. F says invega has been a miracle drug. Plan: Continue [name] back to every 4 weeks at 234mg  q weeks  OK to stop risperidone id not needed for voices.  01/16/2022 appointment with the following noted: Sleep ok off hyrdroxyzine and prazosin without NM. Sleep 10 hours.   No risperidone. No voices but some intrusive thoughts that are not too bad. New job helps his negative thoughts.  Able to manage anger.  But can have thoughts of hurting others that provoke him couple times per week. No paranoid or fearful thoughts.  No acting ou tof anger.   Greater sense of purpose now that FT job.   No depression. Consistent with scheduled injection.   Plan: No med changes  03/18/22 appt noted: Has been off risperidone since December 2022. Still hear the voices but not focusing on them.  Comes and goes and more when quiet and when busy  not as much.  Voices remind him of things and can be positive or negative.  Reminded frequently of relationship struggles and how his mental health affected his relationships.   Not socially active. Busy most of the time with work.  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  Works for [employer].  Louann Sjogren is great. No longer has guns.  At workplace does have coworker who yells hat him but learning to tolerate him. Propranolol helps him deal with this at work. Getting counseling through [facility]io Forget.   Sleeps 11-12 hours Weight gain 220.  2 years ago was 170# before psychosis and meds. CO SE weight gain, fatigue, sleepiness with Invega sustena and asksabout alternatives. Asks if he might eventually be able to stop antipsychotic. Plan: start modafinil 100 mg every morning for 1 week and then 200 mg daily for sleepiness.  Continue antipsychotic  05/13/22 appt noted: Has been consistent with monthly Invega Sustenna 234 mg per 1.5 mL IM SE wt gain and sleepy.  Hungry. Thinks invega dulled excitement to mountain bike and doesn't do it. Started prednisone today for poison ivy. Used 1/2 bottle modafinil and more alert at work.  Able to stay up later.  Uses it prn.  SE HA initially resolved. Sometimes trouble falling asleep.  Still vivid dreams and some NM.  Themes violent or sexual. It really affected him before the hsopital bc thought they were reality.    Now can tell the difference. Work performance affected by weight.  But still doing ok with work. Voices not very noticeable usually.   No sig paranoia but socially isolated and content about that.  Realizes it's not great long term.  Social interaction at work.  Was not confident talking to servers at restaurants and that's fine now. Aware he has trouble making eye contact with people. No longer feels hostile nor aggressive. Plan: Will reduce  [name] every 4 weeks 156 mg q weeks .    08/23/22 noted from nurse: Osamah  came in yesterday 08/22/2022 and received a sample supply of Invega Sustenna 156 mg/ml to left upper buttocks IM, tolerated well. Informed him I would discuss with Dr. Jennelle Human and get him scheduled to see him asap.    Pt quit his job on Monday, October 23rd due to a conflict with  a co-worker. Pt reports he does have another job, which is a work from home position he will be starting second week in November I believe. He is picking out new insurance that his Dad will pay for. Pt was asking to change medication prior to his issue with work due to weight gain. Pt has been on the lower dose of Invega 156 mg since July and needs follow up with Dr. Jennelle Human now as well to see how he's doing    MD response:  Noted.  We should not make any further med changes until he's seen.  He has an appointment with me in about 5 weeks.  We could consider some change if he wanted to see either Arlys John or Victorino Dike, the nurse practitioners, and then I could discuss the case with them.  But if he wants to see me to make the decision he will need to keep his appointment with me and we will go from there at that point      10/02/22 appt noted: also with father A little less hunger cravings and a little better energy with less Invega. Felt coworker was harassing him and felt he needed to quit. Was doing well until that week.  Had just gotten a GF at the time he quit.  If married with kids wouldn't have quit but he felt it was a no where job. 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  Has been accepted to the [employer] [employer] [facility] program. 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  Got financial aid for free school. Lost health insurance with loss of job.  Concerns for cost of Invega.  Applying for pt assistance. Wants firearm for hunting and self defense and target shooting.  Has had a concealed carry permit.  Has had guns for years before. No problems with meds and concerns with meds. No depression and anxiety, nor paranoia, nor agitation.   To bed 9 to  830 AM.  Needs that much sleep right now.    02/12/23 appt noted: aSking to reduce Invega again due to fatigue.  Called a couple of days ago. Hinda Glatter Sustenna dosage was reduced to 117 mg/mL as of 01/21/2023. Notice more energy with reduction and lost 10#.   No increase in anxiety or worry.  Got emotional for no reason talking to someone yesterday.  Overall has good social confidence.  But did have a lot of social anxiety in past when illness was more severe. Wanting to get gun rights restored and will need an attorney.  Was told it would need to be 7 years since IVC.  Wants to stop all meds.  He doesn't think he'll relapse off meds.   Applying for disability bc can't have energy to work outside.   Lives at home.  Parents say he can't stop meds AMA.   Daily HA for several years.  Can function through.  No aura.  No nausea or light sensitivity.  Not disabling.   Plan:  Just reduced [name] every 4 weeks to 117 mg q 4 weeks .   05/13/23 appt noted: Reviewed letter from mother reporting SX consistent with neg sx schiz but not much positive sx.  Not sleeping as excessively. Met with Dr. Neale Burly over migraine. Asks to take diff med for anxiety.  Likes to be outdoors and in nature but gets stuck in house bc of the heat.  Taking hydroxyzine and it is fine but wants to try something else.  Feels he needs something once daily about  mid day to calm me down. Anxiety and HA go hand in hand.  Get stress out and on edge.  Can turn into panic with high pulse and BP and red face.  Hanging out with GF or F can stress him out.  Usually relational.   Recent Gf ended bc stressing him out.   Psych eval yesterday for disability. Sleep fine.   No weed in over a year and no desire.   Agrees to continue current [place].    08/13/23 appt noted: Meds: Invega sustenna 117 mg every month, clonazepam 0.5 mg BID.  No propranolol bc on amlodipine.  Occ hydroxyzine for anxiety.  No modafinil bc HA DX DM II and put on  Mounjaro and disc dosing.   Thoughts peaceful.  No paranoia.  Some anxiety.  Not dep. Asked about new antipsychotic.  Asked about prior meds.   Lost from 220# to 200# .   Asks about lowering dose Invega or switching meds DT wt gain, hunger, DM, cholesterol. Stopped zonesimide bc now that BP and glucose HA better. Invega doesn't really bother him with SE like in the past but would like to consider a switch. Staying at home except going to church.    Past Psychiatric Medication Trials: History Dr. Krista Blue outpatient [facility] 10/2020 Lorazepam Propranolol Lamotrigine  Invega sustaina started Jan 2022 in hospital [facility] Risperidone 6 Abilify 10 NR and wt gain  Lithium 1200 in 2020 level 0.8 Trazodone, hydroxyzine, prazosin lithium Depakote 1000 started 12/29/20 Modafinil HA  Review of Systems:  Review of Systems  Constitutional:  Positive for fatigue and unexpected weight change.  Cardiovascular:  Negative for chest pain and palpitations.  Neurological:  Positive for headaches. Negative for tremors and weakness.  Psychiatric/Behavioral:  Negative for hallucinations and suicidal ideas. The patient is nervous/anxious.     Medications: I have reviewed the patient's current medications.  Current Outpatient Medications  Medication Sig Dispense Refill   baclofen (LIORESAL) 10 MG tablet Take 10 mg by mouth. Once or twice a week     Cetirizine HCl (ZYRTEC ALLERGY PO) Take by mouth at bedtime. Unsure of dosing. Takes with Benadryl for allergies and sleep.     clonazePAM (KLONOPIN) 0.5 MG tablet TAKE 1 TABLET BY MOUTH 2 TIMES DAILY. 60 tablet 0   diphenhydrAMINE (BENADRYL) 25 MG tablet Take 50 mg by mouth at bedtime as needed. Taking for allergies and a sleep aid     hydrOXYzine (ATARAX) 25 MG tablet TAKE 1-2 TABLETS AT NIGHT FOR SLEEP (Patient taking differently: TAKE 1-2 TABLETS AT NIGHT FOR SLEEP - takes "somewhat") 180 tablet 0   MOUNJARO 2.5 MG/0.5ML Pen Inject 2.5 mg into the skin once  a week.     Paliperidone ER (INVEGA SUSTENNA) injection INJECT 117 MG INTO THE MUSCLE EVERY 30 (THIRTY) DAYS. 0.75 mL 2   propranolol (INDERAL) 20 MG tablet Take 1 tablet (20 mg total) by mouth 2 (two) times daily. 180 tablet 0   zonisamide (ZONEGRAN) 25 MG capsule Take 100 mg by mouth at bedtime.     Current Facility-Administered Medications  Medication Dose Route Frequency Provider Last Rate Last Admin   Paliperidone ER (INVEGA SUSTENNA) injection 117 mg  117 mg Intramuscular Q28 days Cottle, Steva Ready., MD   117 mg at 07/23/23 1600    Medication Side Effects: weight, ? Breast enlargment  Allergies: No Known Allergies  Past Medical History:  Diagnosis Date   Migraine     History reviewed. No pertinent family history.  Social History   Socioeconomic History   Marital status: Single    Spouse name: Not on file   Number of children: Not on file   Years of education: Not on file   Highest education level: Not on file  Occupational History   Not on file  Tobacco Use   Smoking status: Light Smoker   Smokeless tobacco: Never  Substance and Sexual Activity   Alcohol use: Not on file   Drug use: Not on file   Sexual activity: Not on file  Other Topics Concern   Not on file  Social History Narrative   Not on file   Social Determinants of Health   Financial Resource Strain: Medium Risk (10/30/2020)   [facility] from [facility], [facility]   Overall Financial Resource Strain (CARDIA)    Difficulty of Paying Living Expenses: Somewhat hard  Food Insecurity: Not on file  Transportation Needs: Not on file  Physical Activity: Not on file  Stress: Stress Concern Present (10/30/2020)   Received from [facility], [facility]   [employer] of Occupational Health - Occupational Stress Questionnaire    Feeling of Stress : To some extent  Social Connections: Unknown (03/10/2022)   Received from [facility], [facility]   Social Network    Social Network: Not on file   Intimate Partner Violence: Unknown (01/31/2022)   Received from [facility], [facility]   HITS    Physically Hurt: Not on file    Insult or Talk Down To: Not on file    Threaten Physical Harm: Not on file    Scream or Curse: Not on file    Past Medical History, Surgical history, Social history, and Family history were reviewed and updated as appropriate.   Fraternal twin brother 2 other brothers  Please see review of systems for further details on the patient's review from today.   Objective:   Physical Exam:  There were no vitals taken for this visit.  Physical Exam Constitutional:      General: He is not in acute distress. Musculoskeletal:        General: No deformity.  Neurological:     Mental Status: He is alert and oriented to person, place, and time.     Coordination: Coordination normal.  Psychiatric:        Attention and Perception: He is attentive. He does not perceive auditory or visual hallucinations.        Mood and Affect: Mood is anxious. Mood is not depressed. Affect is not labile, blunt or inappropriate.        Speech: Speech normal.        Behavior: Behavior normal.        Thought Content: Thought content is not paranoid or delusional. Thought content does not include homicidal or suicidal ideation. Thought content does not include suicidal plan.        Cognition and Memory: Cognition and memory normal.        Judgment: Judgment normal.     Comments: Insight intact Mild paranoia resolved ongoing discomfort around people..  No HI or intent. Limited eye contact but  pleasant. Some dep but fights it.      Lab Review:     Component Value Date/Time   NA 138 08/25/2020 1522   K 4.1 08/25/2020 1522   CL 101 08/25/2020 1522   CO2 26 08/25/2020 1522   GLUCOSE 95 08/25/2020 1522   BUN 12 08/25/2020 1522   CREATININE 0.92 08/25/2020 1522  CALCIUM 9.1 08/25/2020 1522   PROT 7.0 08/25/2020 1522   ALBUMIN 4.2 08/25/2020 1522   AST 21 08/25/2020 1522    ALT 17 08/25/2020 1522   ALKPHOS 59 08/25/2020 1522   BILITOT 0.7 08/25/2020 1522   GFRNONAA >60 08/25/2020 1522       Component Value Date/Time   WBC 8.2 08/25/2020 1522   RBC 5.20 08/25/2020 1522   HGB 14.8 08/25/2020 1522   HCT 45.6 08/25/2020 1522   PLT 293 08/25/2020 1522   MCV 87.7 08/25/2020 1522   MCH 28.5 08/25/2020 1522   MCHC 32.5 08/25/2020 1522   RDW 12.1 08/25/2020 1522    No results found for: "POCLITH", "LITHIUM"   No results found for: "PHENYTOIN", "PHENOBARB", "VALPROATE", "CBMZ"   Normal Head CT scan.  They asked whether further neuro work-up was indicated and there does not appear to be focal neurologic signs or symptoms to suggest that.  Genesight testing disc in detail.  Disc Mother's concerns with his lack of motivation and energy and lack of self direction.  .res Assessment: Plan:    Cobi was seen today for follow-up.  Diagnoses and all orders for this visit:  Schizoaffective disorder, unspecified type (HCC)  Generalized anxiety disorder  Social anxiety disorder  Nightmare  Insomnia due to mental condition  Hypersomnolence disorder, with medical condition, persistent, moderate   Headaches resolved   30 min face to face time with patient  He has not had any problems with dose reduction in [place] from 234 to 117 mg as of 01/21/2023.Marland Kitchen  Psychotic sx still under control with still sig reduced eye contract but is able to make eye contact.   We discussed the importance of med compliance and how that can change future outcomes with psychotic symptoms as well as relapse risk.  We discussed side effects of antipsychotic medications.   Overall his psychotic symptoms are generally resolved..still neg sx of schizophrenia .  No hostility   Continue counseling with Elio Forget  and work on Pharmacist, community and self care .  That is helpful  Support groups [facility], Kellen, NAMI.  Going to [employer] support group by Zoom a couple of times.   Faith based.  Since March 2024 [name] every 4 weeks to 117 mg q 4 weeks .  We discussed that it can take several months for long-acting injectables to have optimal effect and reach steady state.  His psychosis is managed but is still sleeping a good bit.  It is too early to consider another dose reduction.  Extensive discussion around my opinion that he has a version of schizophrenia that is highly likely to recur if he stops all antipsychotic.  In addition if he has recurrence of psychosis it is not guarantee that he will be able to recover as fully after such an episode as he is currently.  Therefore I will not agree to stopping antipsychotic at any time in the foreseeable future.  We could discuss options of switching to a less sedating antipsychotic.  We had an extensive discussion about the current side effects of antipsychotics that he is experiencing and the potential for switch but that is very risky. Disc importance of preventing future psychosis if possible.  Consider checking prolactin  Ok clonazepam in place of hydroxyzine for anxiety midday 0.5 mg daily.  Consider alternatives DT SE Vraylar, Latuda, Caplyta.  Leafy Kindle is probably the least sedating with least DM risk.    Propranolol 20-40 mg before work to deal  with anxiety and neg thoughts with stressful coworker.  Can take it regularly to help prevent HA.    Disc dietary issues and risk weight gain.  Discussed potential metabolic side effects associated with atypical antipsychotics, as well as potential risk for movement side effects. Advised pt to contact office if movement side effects occur.  Disc SE risk.   Tobacco use only rarely bc DDI.  Avoid THC.   Encourage exercise and stimulation.  I support his pursuit of disability DT residual negative sx of schizophrenia and fatigue DT meds interfering with his ability to work outside.   Has atty Pamalee Leyden for this.  FU 8 weeks  Meredith Staggers, MD, DFAPA   Please see  After Visit Summary for patient specific instructions.  Future Appointments  Date Time Provider Department Center  08/21/2023  2:30 PM CP-NURSE CP-CP None  08/21/2023  3:00 PM Waldron Session, [facility] CP-CP None  09/23/2023  2:30 PM CP-NURSE CP-CP None  09/23/2023  3:00 PM Waldron Session, [facility] CP-CP None  10/20/2023  2:00 PM Waldron Session, [facility] CP-CP None  10/20/2023  3:00 PM CP-NURSE CP-CP None    No orders of the defined types were placed in this encounter.    -------------------------------

## 2023-08-14 ENCOUNTER — Telehealth: Payer: Self-pay | Admitting: Psychiatry

## 2023-08-14 NOTE — Telephone Encounter (Signed)
Medical Source Statement form received from [facility].  Given to [facility] for Completion.

## 2023-08-21 ENCOUNTER — Ambulatory Visit: Payer: BC Managed Care – PPO

## 2023-08-21 ENCOUNTER — Ambulatory Visit: Payer: BC Managed Care – PPO | Admitting: Mental Health

## 2023-08-21 DIAGNOSIS — F259 Schizoaffective disorder, unspecified: Secondary | ICD-10-CM

## 2023-08-21 DIAGNOSIS — F411 Generalized anxiety disorder: Secondary | ICD-10-CM

## 2023-08-21 NOTE — Progress Notes (Signed)
[facility] Psychotherapy Note  Name: Scott Vargas Date: 08/21/23 MRN: [id number] DOB: [birth year] PCP: Noberto Retort, MD    Time spent: 49 minutes  Treatment:  individual therapy  Mental Status Exam:    Appearance:    Casual     Behavior:   Appropriate  Motor:   WNL  Speech/Language:    Clear and Coherent  Affect:   Full range   Mood:   Euthymic  Thought process:   normal  Thought content:     WNL  Sensory/Perceptual disturbances:     none  Orientation:   x4  Attention:   Good  Concentration:   Good  Memory:   Intact  Fund of knowledge:    Consistent with age and development  Insight:     Good  Judgment:    Good  Impulse Control:   Good     Reported Symptoms:  anxiety, depression, rumination, avoidant behavior (crowds at times)  Risk Assessment: Danger to Self:  No Self-injurious Behavior: No Danger to Others: No Duty to Warn:no Physical Aggression / Violence:No  Access to Firearms a concern: No  Gang Involvement:No  Patient / guardian was educated about steps to take if suicide or homicide risk level increases between visits: yes While future psychiatric events cannot be accurately predicted, the patient does not currently require acute inpatient psychiatric care and does not currently meet [facility] involuntary commitment criteria.   Diagnoses:    ICD-10-CM   1. Schizoaffective disorder, unspecified type (HCC)  F25.9     2. Generalized anxiety disorder  F41.1          Subjective:   Patient presented on time for today's session.  Assessed progress where patient shared how he continues to adjust to medication.  Reports having a medication change recently that he feels caused him some weight gain, he plans to discuss further with his doctor.  He reports his other medication he feels has continued to be effective in managing his symptoms.  He continues to report some anxiety particularly when driving.  This was explored collaboratively in  session, further identifying specifics where is typically when there is more traffic and/or when he is on highways.  Facilitated his identifying associated cognitions and worked with patient to identify ways to reframe.  He was able to identify some successes he has had with driving and having less anxiety in some situations which were identified.  Other stressors were identified and processed, some associated with family issues as well as financial stress as he continues to be unemployed.  Interventions:   Motivational interviewing, supportive therapy, CBT  Plan: Patient is to use CBT, mindfulness and coping skills to help decrease symptoms associated with their diagnosis.  Patient continues to embrace ways to take steps toward his independence and self acceptance.    Long-term goal:   Reduce overall level, frequency, and intensity of the feelings of anxiety in social situations from every occurrence causing anxiety to 1 our of 5 occurrences.  Decrease feelings of sadness and anxiety related to life stressors.   Short-term goal:  Patient to continue to work on identifying thoughts that make his anxiety increased in social situations and to continue to allow himself to engage in social situations to continue to try and work past his anxiety.   Patient to ground himself with rational, calming self talk when having intrusive thoughts. Increased feelings of self-worth by taking steps toward achieving more independence by finding and maintaining employment  Assessment of progress:  progressing     Waldron Session, [facility]

## 2023-08-21 NOTE — Progress Notes (Signed)
NURSES NOTE:    Pt arrived for his monthly LAI of [place]. Pt given injection of [place] 117mg /0.75 ml in his left upper gluteal area. He tolerated well. His last dose was of [place]  was on 07/23/2023. Pt brings his medication [facility] from [facility]. Pt already scheduled his next injection on 09/23/2023. Pt asked about his paperwork from his attorney and I informed him Dr. Jennelle Human and I were working on that for him. Pt had apt with Elio Forget after injection.       LOT [id number] EXP JAN 2026 NDC 09811-914-78

## 2023-08-25 ENCOUNTER — Other Ambulatory Visit: Payer: Self-pay

## 2023-08-25 ENCOUNTER — Telehealth: Payer: Self-pay | Admitting: Psychiatry

## 2023-08-25 DIAGNOSIS — F4010 Social phobia, unspecified: Secondary | ICD-10-CM

## 2023-08-25 DIAGNOSIS — F411 Generalized anxiety disorder: Secondary | ICD-10-CM

## 2023-08-25 DIAGNOSIS — F259 Schizoaffective disorder, unspecified: Secondary | ICD-10-CM

## 2023-08-25 MED ORDER — CLONAZEPAM 0.5 MG PO TABS: 0.5000 mg | ORAL_TABLET | Freq: Two times a day (BID) | ORAL | 1 refills | Status: DC

## 2023-08-25 NOTE — Telephone Encounter (Signed)
pended

## 2023-08-25 NOTE — Telephone Encounter (Signed)
Next appt is 10/14/23. Scott Vargas is requesting a refill on Clonazepam and made a request for 3 refills. States he has 5 pills left. Pharmacy is:  [id number] - [place], [place] - [address] AT [address]   Phone: [phone]  Fax: [phone]    Scott Vargas's phone number is [phone].

## 2023-08-25 NOTE — Telephone Encounter (Signed)
I'm only going to give him enough RF to get to appt per our usual policy.

## 2023-08-28 DIAGNOSIS — Z23 Encounter for immunization: Secondary | ICD-10-CM | POA: Diagnosis not present

## 2023-08-28 DIAGNOSIS — E119 Type 2 diabetes mellitus without complications: Secondary | ICD-10-CM | POA: Diagnosis not present

## 2023-08-28 DIAGNOSIS — F3132 Bipolar disorder, current episode depressed, moderate: Secondary | ICD-10-CM | POA: Diagnosis not present

## 2023-08-28 DIAGNOSIS — I10 Essential (primary) hypertension: Secondary | ICD-10-CM | POA: Diagnosis not present

## 2023-08-28 DIAGNOSIS — F2081 Schizophreniform disorder: Secondary | ICD-10-CM | POA: Diagnosis not present

## 2023-09-05 DIAGNOSIS — Z0289 Encounter for other administrative examinations: Secondary | ICD-10-CM

## 2023-09-19 ENCOUNTER — Telehealth: Payer: Self-pay | Admitting: Mental Health

## 2023-09-19 NOTE — Telephone Encounter (Signed)
Forms received 11/22. Will give to [facility] to determine.

## 2023-09-19 NOTE — Telephone Encounter (Signed)
Scott Vargas has a form coming from a lawyer on his disability review. He would like for [name] to also complete this form ( same form Dr. Jennelle Human completed). It will be faxed over.

## 2023-09-23 ENCOUNTER — Ambulatory Visit: Payer: BC Managed Care – PPO | Admitting: Mental Health

## 2023-09-23 ENCOUNTER — Ambulatory Visit: Payer: BC Managed Care – PPO

## 2023-09-23 DIAGNOSIS — F4010 Social phobia, unspecified: Secondary | ICD-10-CM

## 2023-09-23 DIAGNOSIS — F259 Schizoaffective disorder, unspecified: Secondary | ICD-10-CM

## 2023-09-23 NOTE — Progress Notes (Signed)
NURSES NOTE:    Pt arrived for his monthly LAI of [place]. Pt given injection of Invega Sustenna 117mg /0.75 ml in his left upper gluteal area. He tolerated well. His last dose was of [place]  was on 08/23/2023. Pt brings his medication [facility]. Pt already scheduled his next injection on 10/20/2023. Pt had apt with Elio Forget after injection.       LOT [id number] EXP JAN 2026 NDC 45409-811-91

## 2023-09-23 NOTE — Progress Notes (Signed)
[facility] Psychotherapy Note  Name: Scott Vargas Date: 09/23/23 MRN: [id number] DOB: 02-Jan-1996 PCP: Noberto Retort, MD    Time spent: 52  minutes  Treatment:  individual therapy  Mental Status Exam:    Appearance:    Casual     Behavior:   Appropriate  Motor:   WNL  Speech/Language:    Clear and Coherent  Affect:   Full range   Mood:   Euthymic  Thought process:   normal  Thought content:     WNL  Sensory/Perceptual disturbances:     none  Orientation:   x4  Attention:   Good  Concentration:   Good  Memory:   Intact  Fund of knowledge:    Consistent with age and development  Insight:     Good  Judgment:    Good  Impulse Control:   Good     Reported Symptoms:  anxiety, depression, rumination, avoidant behavior (crowds at times)  Risk Assessment: Danger to Self:  No Self-injurious Behavior: No Danger to Others: No Duty to Warn:no Physical Aggression / Violence:No  Access to Firearms a concern: No  Gang Involvement:No  Patient / guardian was educated about steps to take if suicide or homicide risk level increases between visits: yes While future psychiatric events cannot be accurately predicted, the patient does not currently require acute inpatient psychiatric care and does not currently meet [facility] involuntary commitment criteria.   Diagnoses:    ICD-10-CM   1. Social anxiety disorder  F40.10     2. Schizoaffective disorder, unspecified type (HCC)  F25.9           Subjective:   Patient presented on time for today's session.  Assessed progress.  Patient shared how he continues to have no psychotic symptoms, considers his mental health stable.  He stated that he wants to work on his physical state however, wants to try and get in shape.  He stated that he was trying to find some part-time employment recently but due to his concerns about his stamina and the type of job offer, he declined.  He continues to identify financial stress, not  having money to go out with friends or potentially have a girlfriend.  He stated he was able to go on a date or 2 recently with a girl which was pleasant. Facilitated his identifying needs and worked with him to reframe thoughts that were self depreciating related to his potential to find a job or eventually be able to feel that he is able to be in a healthier physical state.  He plans to follow through with walking exercise, lifts weights to improve.  Provide support and encouragement for patient to consider looking for employment options.  Interventions:   Motivational interviewing, supportive therapy, CBT  Plan: Patient is to use CBT, mindfulness and coping skills to help decrease symptoms associated with their diagnosis.  Patient continues to embrace ways to take steps toward his independence and self acceptance.    Long-term goal:   Reduce overall level, frequency, and intensity of the feelings of anxiety in social situations from every occurrence causing anxiety to 1 our of 5 occurrences.  Decrease feelings of sadness and anxiety related to life stressors.   Short-term goal:  Patient to continue to work on identifying thoughts that make his anxiety increased in social situations and to continue to allow himself to engage in social situations to continue to try and work past his anxiety.   Patient to ground himself  with rational, calming self talk when having intrusive thoughts. Increased feelings of self-worth by taking steps toward achieving more independence by finding and maintaining employment    Assessment of progress:  progressing     Waldron Session, [facility]

## 2023-10-07 ENCOUNTER — Ambulatory Visit: Payer: BC Managed Care – PPO | Admitting: Mental Health

## 2023-10-07 DIAGNOSIS — F259 Schizoaffective disorder, unspecified: Secondary | ICD-10-CM

## 2023-10-07 NOTE — Progress Notes (Signed)
[facility] Psychotherapy Note  Name: Scott Vargas Date: 10/07/23 MRN: [id number] DOB: 1996-09-29 PCP: Noberto Retort, MD  Time spent: 50 minutes  Treatment:  individual therapy  Virtual Visit via Telehealth Note Connected with patient by a telemedicine/telehealth application, with their informed consent, and verified patient privacy and that I am speaking with the correct person using two identifiers. I discussed the limitations, risks, security and privacy concerns of performing psychotherapy and the availability of in person appointments. I also discussed with the patient that there may be a patient responsible charge related to this service. The patient expressed understanding and agreed to proceed. I discussed the treatment planning with the patient. The patient was provided an opportunity to ask questions and all were answered. The patient agreed with the plan and demonstrated an understanding of the instructions. The patient was advised to call  our office if  symptoms worsen or feel they are in a crisis state and need immediate contact.   Therapist Location: office Patient Location: home    Mental Status Exam:    Appearance:    Casual     Behavior:   Appropriate  Motor:   WNL  Speech/Language:    Clear and Coherent  Affect:   Full range   Mood:   Euthymic  Thought process:   normal  Thought content:     WNL  Sensory/Perceptual disturbances:     none  Orientation:   x4  Attention:   Good  Concentration:   Good  Memory:   Intact  Fund of knowledge:    Consistent with age and development  Insight:     Good  Judgment:    Good  Impulse Control:   Good   Reported Symptoms:  anxiety, depression, rumination, avoidant behavior (crowds at times)  Risk Assessment: Danger to Self:  No Self-injurious Behavior: No Danger to Others: No Duty to Warn:no Physical Aggression / Violence:No  Access to Firearms a concern: No  Gang Involvement:No  Patient / guardian was  educated about steps to take if suicide or homicide risk level increases between visits: yes While future psychiatric events cannot be accurately predicted, the patient does not currently require acute inpatient psychiatric care and does not currently meet [facility] involuntary commitment criteria.  Diagnoses:    ICD-10-CM   1. Schizoaffective disorder, unspecified type (HCC)  F25.9       Subjective:   Patient presented on time for today's session.  Patient shared recent events where he stated that he had a pleasant Thanksgiving with family.  He went on to share how his grandfather visited and it did cause some family stress going on to share more details regarding family dynamics including his grandfather.  He went on to share how he has been trying to find some type of employment, part-time but has been struggling to obtain interests from potential employers.  He went on to share considerations he is making and the types of jobs he might pursue that he feels will be a good fit as he attempts to reenter the workforce.  He continues to express wanting to get in better shape physically, exercise and plans to talk with his father and brother later today as they attempt to effects of treadmill they own.  He plans to attend his coming med management appointment with Dr. Jennelle Human next week in the hopes of discussing medication options, continues to report wanting to lose weight and feels his medication plays a role, while also acknowledging the benefits  of the medication in terms of symptom management.  He continues to make attempts socially, chatted with others online recently and has a friend is moving back to his area which he looks forward to.   Interventions:   Motivational interviewing, supportive therapy  Plan: Patient is to use CBT, mindfulness and coping skills to help decrease symptoms associated with their diagnosis.  Patient continues to embrace ways to take steps toward his independence and  self acceptance.    Long-term goal:   Reduce overall level, frequency, and intensity of the feelings of anxiety in social situations from every occurrence causing anxiety to 1 our of 5 occurrences.  Decrease feelings of sadness and anxiety related to life stressors.   Short-term goal:  Patient to continue to work on identifying thoughts that make his anxiety increased in social situations and to continue to allow himself to engage in social situations to continue to try and work past his anxiety.   Patient to ground himself with rational, calming self talk when having intrusive thoughts. Increased feelings of self-worth by taking steps toward achieving more independence by finding and maintaining employment  Assessment of progress:  progressing   Waldron Session, [facility]

## 2023-10-14 ENCOUNTER — Telehealth: Payer: Self-pay | Admitting: Psychiatry

## 2023-10-14 ENCOUNTER — Telehealth (INDEPENDENT_AMBULATORY_CARE_PROVIDER_SITE_OTHER): Payer: BC Managed Care – PPO | Admitting: Psychiatry

## 2023-10-14 ENCOUNTER — Encounter: Payer: Self-pay | Admitting: Psychiatry

## 2023-10-14 DIAGNOSIS — F259 Schizoaffective disorder, unspecified: Secondary | ICD-10-CM

## 2023-10-14 DIAGNOSIS — F5105 Insomnia due to other mental disorder: Secondary | ICD-10-CM

## 2023-10-14 DIAGNOSIS — F4010 Social phobia, unspecified: Secondary | ICD-10-CM | POA: Diagnosis not present

## 2023-10-14 DIAGNOSIS — F411 Generalized anxiety disorder: Secondary | ICD-10-CM

## 2023-10-14 MED ORDER — BUPROPION HCL ER (XL) 150 MG PO TB24: ORAL_TABLET | ORAL | 0 refills | Status: DC

## 2023-10-14 NOTE — Progress Notes (Signed)
Scott Vargas [id number] 04/28/1996 27 y.o.  Video Visit via My Chart  I connected with pt by video using My Chart and verified that I am speaking with the correct person using two identifiers.   I discussed the limitations, risks, security and privacy concerns of performing an evaluation and management service by My Chart  and the availability of in person appointments. I also discussed with the patient that there may be a patient responsible charge related to this service. The patient expressed understanding and agreed to proceed.  I discussed the assessment and treatment plan with the patient. The patient was provided an opportunity to ask questions and all were answered. The patient agreed with the plan and demonstrated an understanding of the instructions.   The patient was advised to call back or seek an in-person evaluation if the symptoms worsen or if the condition fails to improve as anticipated.  I provided 45 minutes of video time during this encounter.  The patient was located at home and the provider was located office. Session [id number]  Subjective:   Patient ID:  Scott Vargas is a 27 y.o. (DOB 22-Aug-1996) male.  Chief Complaint:  Chief Complaint  Patient presents with   Follow-up   Fatigue   Depression   Medication Problem    HPI Scott Vargas presents to the office today for follow-up of schizoaffective disorder with recent hospitalizations. First seen in our office 12/29/2020.  His symptoms were improved but not resolved and included depression, paranoia, intrusive violent images, chronic suicidal thoughts. He was just out of the hospital so no medications were in changed which included Invega IM every 4 weeks, risperidone 2 mg nightly, prazosin 2 mg nightly, trazodone 100 mg nightly, hydroxyzine 50 mg 3 times daily as needed  01/12/2021 appointment with the following noted: Working and going pretty smooth.  Scott Vargas out with friends and it went better than I  thought. 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   No pot use since here. Mood pretty chill. Still gets voices negative and positive like "people don't like you".  Voices come and go.  Dealt with it a long time but never told anyone.  Better if busy.   Not sure about character of voices.  They come to me.  Worse when not busy and any time of day.  Voices are frustrating.  Voices got worse July 2020 in sprittual experience.  Not depressed.  Pleased friends accepted him better than expected. Attending young adults group at [facility] which is more centered spiriitually.   Still seeing therapist. Some people bother him but not sure why.  Trying to make new friends. In [place] got 2 restraining orders from 2 different churches.  No outstanding charges. Alcohol 2 -3 per week. Started Depakote without problems.  Pretty well with anger. Sleep OK with mixed type.  NM awaken him once weekly. No SE.  Then with parents [name] & dad Not as depressed as he was. M & F notes increase anxiety as got closer to injection date.  F sees it's "night and day"  Difference and I have my son back. M agrees he's exhibited bravery in doing difficult things. Last injection date March 12- day outpatient [facility], [place].  Plan: Get next [name] 1 week early by 1 week for a couple of mos DT observed wearing off.  03/07/2021 appointment with the following noted:  Alone and with Mother Last received Invega Sustenna 234 mg per 1.5 mL injection on 02/26/2021.  Last 2  injections have it 3-week intervals instead of typical 4-week intervals bc of ongoing psychotic sx. Doing well and exercising and biking.  Doing ok with voices.  Still hear them somewhat but less prominent and ignoring them more.  Mood pretty calm.  Not lately much fear or anxiety.  Maybe a little better with small talk with people. 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 No problems with meds.  Sometimes lower motivation.    Less  hydroxyzine 1/week.   Dreams good and bad still.  Rarely will be awakened by them. No anger outbursts. Mild paranoia out in public about what people are thinking. With Mom:  Superb cp to 2 mos ago.  Some days lacks motivation.  Better on days he works. Plan: Trial L-carnitine 1000 mg 1 twice daily to see if energy and focus are better. Change Invega Sustaina back to every 4 weeks at 234mg  q weeks   05/31/2021 appt noted: Called a couple days ago complaining of more anxiety. Lately with job search and mostly unemployed has a lot of free time and not feeling good about himself.  Looking for job through [employer]. Sees GF regularly in [place].  Likes the summer. When in groups or around strangers has anxiety but it is improved over the past.  Does not feel as paranoid.  But will feel people are looking at him too much.  Causes anxiety but not anger.  Makes me not want to be around people.Takes hydroxyzine with anxiety and it helps. AH "not so bad".  Dreams can be scary or disturbing.   My mind was hijacked several months ago.  Meds leveled me out.   Taking trazodone at night for sleep.  Tolerating meds except weight gain from 170 to 210# over the year.  Hungry all the time. Risperidone has not bee taken. A/P: overall psychotic sx improved not resolved hydroxyzine instead of trazodone for sleep to see if dreams are better. Continue [name] back to every 4 weeks at 234mg  q weeks  Add risperidone 1/2 tablet nightly for 1-2 weeks and if no improvement in paranoia and anxiety then increase to 1 nightly  08/13/21 appt noted: alone and with mother Usually sleep good.  Rare NM. Better comfort around people. Went to [employer]. No paranoia.  AH less bothersome. Mainly evident intermittently.  Better than last visit. No depression. SE none except weight.   Energy still kind of low. 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 M sees a lot of improvement this year. Marguerite done well with counseling  per pt and M.  She's concerned about his sleep about 12 hour and longer if nothing scheduled.   He says he doesn't have much to do & lacks motivation to exercise as in past.   Plan:  Continue hydroxyzine instead of trazodone for sleep bc dreams are better. Continue Invega Sustaina back to every 4 weeks at 234mg  q weeks DT observed wearing off.   Continue risperidone 1 mg nightly bc paranoia and voices are better.  10/18/21 appt noted: alone and with father Stopped all tablets including Depakote, risperidone bc didn't think he needed them. Thinks he needs hydroxyzine 50 hs for sleep. No violent thoughts off Depakote.   AH about the same or better off risperidone. Se with them he thinks he felt a little question.  Energy is better right now.  Not needing to nap in afternoon.  Still dreams a lot.  NM once weekly.  Rather not take the prazosin right now.Marland Kitchen  A little social avoidance ongoing but when he does seems ok. Disappointed he didn't get job at [employer].    Not currently working anywhere. No problems with shot except sore for a wek after the shot.   Is video gaming again some now and wasn't for awhile.  Including complex games. F says invega has been a miracle drug. Plan: Continue [name] back to every 4 weeks at 234mg  q weeks  OK to stop risperidone id not needed for voices.  01/16/2022 appointment with the following noted: Sleep ok off hyrdroxyzine and prazosin without NM. Sleep 10 hours.   No risperidone. No voices but some intrusive thoughts that are not too bad. New job helps his negative thoughts.  Able to manage anger.  But can have thoughts of hurting others that provoke him couple times per week. No paranoid or fearful thoughts.  No acting ou tof anger.   Greater sense of purpose now that FT job.   No depression. Consistent with scheduled injection.   Plan: No med changes  03/18/22 appt noted: Has been off risperidone since December 2022. Still hear the voices but not  focusing on them.  Comes and goes and more when quiet and when busy not as much.  Voices remind him of things and can be positive or negative.  Reminded frequently of relationship struggles and how his mental health affected his relationships.   Not socially active. Busy most of the time with work.  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  Works for [employer].  Louann Sjogren is great. No longer has guns.  At workplace does have coworker who yells hat him but learning to tolerate him. Propranolol helps him deal with this at work. Getting counseling through [facility].   Sleeps 11-12 hours Weight gain 220.  2 years ago was 170# before psychosis and meds. CO SE weight gain, fatigue, sleepiness with Invega sustena and asksabout alternatives. Asks if he might eventually be able to stop antipsychotic. Plan: start modafinil 100 mg every morning for 1 week and then 200 mg daily for sleepiness.  Continue antipsychotic  05/13/22 appt noted: Has been consistent with monthly Invega Sustenna 234 mg per 1.5 mL IM SE wt gain and sleepy.  Hungry. Thinks invega dulled excitement to mountain bike and doesn't do it. Started prednisone today for poison ivy. Used 1/2 bottle modafinil and more alert at work.  Able to stay up later.  Uses it prn.  SE HA initially resolved. Sometimes trouble falling asleep.  Still vivid dreams and some NM.  Themes violent or sexual. It really affected him before the hsopital bc thought they were reality.    Now can tell the difference. Work performance affected by weight.  But still doing ok with work. Voices not very noticeable usually.   No sig paranoia but socially isolated and content about that.  Realizes it's not great long term.  Social interaction at work.  Was not confident talking to servers at restaurants and that's fine now. Aware he has trouble making eye contact with people. No longer feels hostile nor aggressive. Plan: Will reduce  [name]  every 4 weeks 156 mg q weeks .    08/23/22 noted from nurse: Scott Vargas came in yesterday 08/22/2022 and received a sample supply of Invega Sustenna 156 mg/ml to left upper buttocks IM, tolerated well. Informed him I would discuss with Dr. Jennelle Human and get him scheduled to see him asap.    Pt quit his job on  Monday, October 23rd due to a conflict with a co-worker. Pt reports he does have another job, which is a work from home position he will be starting second week in November I believe. He is picking out new insurance that his Dad will pay for. Pt was asking to change medication prior to his issue with work due to weight gain. Pt has been on the lower dose of Invega 156 mg since July and needs follow up with Dr. Jennelle Human now as well to see how he's doing    MD response:  Noted.  We should not make any further med changes until he's seen.  He has an appointment with me in about 5 weeks.  We could consider some change if he wanted to see either Arlys John or Victorino Dike, the nurse practitioners, and then I could discuss the case with them.  But if he wants to see me to make the decision he will need to keep his appointment with me and we will go from there at that point      10/02/22 appt noted: also with father A little less hunger cravings and a little better energy with less Invega. Felt coworker was harassing him and felt he needed to quit. Was doing well until that week.  Had just gotten a GF at the time he quit.  If married with kids wouldn't have quit but he felt it was a no where job. 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  Has been accepted to the [facility] program. 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  Got financial aid for free school. Lost health insurance with loss of job.  Concerns for cost of Invega.  Applying for pt assistance. Wants firearm for hunting and self defense and target shooting.  Has had a concealed carry permit.  Has had guns for years before. No problems with meds and concerns with  meds. No depression and anxiety, nor paranoia, nor agitation.   To bed 9 to 830 AM.  Needs that much sleep right now.    02/12/23 appt noted: aSking to reduce Invega again due to fatigue.  Called a couple of days ago. Hinda Glatter Sustenna dosage was reduced to 117 mg/mL as of 01/21/2023. Notice more energy with reduction and lost 10#.   No increase in anxiety or worry.  Got emotional for no reason talking to someone yesterday.  Overall has good social confidence.  But did have a lot of social anxiety in past when illness was more severe. Wanting to get gun rights restored and will need an attorney.  Was told it would need to be 7 years since IVC.  Wants to stop all meds.  He doesn't think he'll relapse off meds.   Applying for disability bc can't have energy to work outside.   Lives at home.  Parents say he can't stop meds AMA.   Daily HA for several years.  Can function through.  No aura.  No nausea or light sensitivity.  Not disabling.   Plan:  Just reduced [name] every 4 weeks to 117 mg q 4 weeks .   05/13/23 appt noted: Reviewed letter from mother reporting SX consistent with neg sx schiz but not much positive sx.  Not sleeping as excessively. Met with Dr. Neale Burly over migraine. Asks to take diff med for anxiety.  Likes to be outdoors and in nature but gets stuck in house bc of the heat.  Taking hydroxyzine and it is fine but wants to try something else.  Feels he needs something once daily about mid day to calm me down. Anxiety and HA go hand in hand.  Get stress out and on edge.  Can turn into panic with high pulse and BP and red face.  Hanging out with GF or F can stress him out.  Usually relational.   Recent Gf ended bc stressing him out.   Psych eval yesterday for disability. Sleep fine.   No weed in over a year and no desire.   Agrees to continue current [place].    08/13/23 appt noted: Meds: Invega sustenna 117 mg every month, clonazepam 0.5 mg BID.  No propranolol bc on  amlodipine.  Occ hydroxyzine for anxiety.  No modafinil bc HA DX DM II and put on Mounjaro and disc dosing.   Thoughts peaceful.  No paranoia.  Some anxiety.  Not dep. Asked about new antipsychotic.  Asked about prior meds.   Lost from 220# to 200# .   Asks about lowering dose Invega or switching meds DT wt gain, hunger, DM, cholesterol. Stopped zonesimide bc now that BP and glucose HA better. Invega doesn't really bother him with SE like in the past but would like to consider a switch. Staying at home except going to church.   Plan no changes  10/14/23 appt noted:  seen with father Meds: Invega sustenna 117 mg every month, clonazepam 0.5 mg BID.  No propranolol bc on amlodipine.  Occ hydroxyzine for anxiety.  No modafinil bc HA Still wants to get off the Invega bc thinks it caused DM and still has some residual dep.  Friend asked about zoloft for dep.  Need something to help dep.  Dark cloud over his head.  Unmotivated.  Stopped activities like walking.  Don't have much going on.  Going to church weekly.  Not hanging out with friends much usually.  Did have a friend over for watching football and enjoyed.   Plans to look for PT job in Jan.  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  Doesn't see good PT options for work.  Never had a hard time finding job in the past.   Clonazepam helped anxiety.   With father on the phone also. They both recognize the stability he gets from the [place].     Past Psychiatric Medication Trials: History Dr. Krista Blue outpatient [facility] 10/2020 Lorazepam Propranolol Lamotrigine  Invega sustaina started Jan 2022 in hospital [facility] Risperidone 6 Abilify 10 NR and wt gain  Lithium 1200 in 2020 level 0.8 Trazodone, hydroxyzine, prazosin lithium Depakote 1000 started 12/29/20 Modafinil HA  Review of Systems:  Review of Systems  Constitutional:  Positive for fatigue and unexpected weight change.  Cardiovascular:  Negative for chest pain and  palpitations.  Neurological:  Positive for headaches. Negative for tremors.  Psychiatric/Behavioral:  Positive for dysphoric mood. Negative for hallucinations and suicidal ideas. The patient is nervous/anxious.     Medications: I have reviewed the patient's current medications.  Current Outpatient Medications  Medication Sig Dispense Refill   baclofen (LIORESAL) 10 MG tablet Take 10 mg by mouth. Once or twice a week     buPROPion (WELLBUTRIN XL) 150 MG 24 hr tablet 1 in then AM for 4 days, then 2 in the AM 30 tablet 0   Cetirizine HCl (ZYRTEC ALLERGY PO) Take by mouth at bedtime. Unsure of dosing. Takes with Benadryl for allergies and sleep.     clonazePAM (KLONOPIN) 0.5 MG tablet Take 1 tablet (0.5 mg total)  by mouth 2 (two) times daily. 60 tablet 1   diphenhydrAMINE (BENADRYL) 25 MG tablet Take 50 mg by mouth at bedtime as needed. Taking for allergies and a sleep aid     hydrOXYzine (ATARAX) 25 MG tablet TAKE 1-2 TABLETS AT NIGHT FOR SLEEP (Patient taking differently: TAKE 1-2 TABLETS AT NIGHT FOR SLEEP - takes "somewhat") 180 tablet 0   MOUNJARO 2.5 MG/0.5ML Pen Inject 2.5 mg into the skin once a week.     Paliperidone ER (INVEGA SUSTENNA) injection INJECT 117 MG INTO THE MUSCLE EVERY 30 (THIRTY) DAYS. 0.75 mL 2   propranolol (INDERAL) 20 MG tablet Take 1 tablet (20 mg total) by mouth 2 (two) times daily. 180 tablet 0   zonisamide (ZONEGRAN) 25 MG capsule Take 100 mg by mouth at bedtime.     Current Facility-Administered Medications  Medication Dose Route Frequency Provider Last Rate Last Admin   Paliperidone ER (INVEGA SUSTENNA) injection 117 mg  117 mg Intramuscular Q28 days Cottle, Steva Ready., MD   117 mg at 09/23/23 1435    Medication Side Effects: weight, ? Breast enlargment  Allergies: No Known Allergies  Past Medical History:  Diagnosis Date   Migraine     History reviewed. No pertinent family history.  Social History   Socioeconomic History   Marital status: Single     Spouse name: Not on file   Number of children: Not on file   Years of education: Not on file   Highest education level: Not on file  Occupational History   Not on file  Tobacco Use   Smoking status: Light Smoker   Smokeless tobacco: Never  Substance and Sexual Activity   Alcohol use: Not on file   Drug use: Not on file   Sexual activity: Not on file  Other Topics Concern   Not on file  Social History Narrative   Not on file   Social Drivers of Health   Financial Resource Strain: Medium Risk (10/30/2020)   Received from [facility], [facility]   Overall Financial Resource Strain (CARDIA)    Difficulty of Paying Living Expenses: Somewhat hard  Food Insecurity: Not on file  Transportation Needs: Not on file  Physical Activity: Not on file  Stress: Stress Concern Present (10/30/2020)   Received from [facility], [facility]   [employer] of Occupational Health - Occupational Stress Questionnaire    Feeling of Stress : To some extent  Social Connections: Unknown (03/10/2022)   [facility] from [facility], [facility]   Social Network    Social Network: Not on file  Intimate Partner Violence: Unknown (01/31/2022)   Received from [facility]   HITS    Physically Hurt: Not on file    Insult or Talk Down To: Not on file    Threaten Physical Harm: Not on file    Scream or Curse: Not on file    Past Medical History, Surgical history, Social history, and Family history were reviewed and updated as appropriate.   Fraternal twin brother 2 other brothers  Please see review of systems for further details on the patient's review from today.   Objective:   Physical Exam:  There were no vitals taken for this visit.  Physical Exam Constitutional:      General: He is not in acute distress. Musculoskeletal:        General: No deformity.  Neurological:     Mental Status: He is alert and oriented to person, place, and  time.     Coordination:  Coordination normal.  Psychiatric:        Attention and Perception: He is attentive. He does not perceive auditory or visual hallucinations.        Mood and Affect: Mood is anxious and depressed. Affect is not labile, blunt or inappropriate.        Speech: Speech normal.        Behavior: Behavior normal.        Thought Content: Thought content is not paranoid or delusional. Thought content does not include homicidal or suicidal ideation. Thought content does not include suicidal plan.        Cognition and Memory: Cognition and memory normal.        Judgment: Judgment normal.     Comments: Insight intact Mild paranoia resolved ongoing discomfort around people..  No HI or intent. Limited eye contact but  pleasant. Some dep and it seems worse      Lab Review:     Component Value Date/Time   NA 138 08/25/2020 1522   K 4.1 08/25/2020 1522   CL 101 08/25/2020 1522   CO2 26 08/25/2020 1522   GLUCOSE 95 08/25/2020 1522   BUN 12 08/25/2020 1522   CREATININE 0.92 08/25/2020 1522   CALCIUM 9.1 08/25/2020 1522   PROT 7.0 08/25/2020 1522   ALBUMIN 4.2 08/25/2020 1522   AST 21 08/25/2020 1522   ALT 17 08/25/2020 1522   ALKPHOS 59 08/25/2020 1522   BILITOT 0.7 08/25/2020 1522   GFRNONAA >60 08/25/2020 1522       Component Value Date/Time   WBC 8.2 08/25/2020 1522   RBC 5.20 08/25/2020 1522   HGB 14.8 08/25/2020 1522   HCT 45.6 08/25/2020 1522   PLT 293 08/25/2020 1522   MCV 87.7 08/25/2020 1522   MCH 28.5 08/25/2020 1522   MCHC 32.5 08/25/2020 1522   RDW 12.1 08/25/2020 1522    No results found for: "POCLITH", "LITHIUM"   No results found for: "PHENYTOIN", "PHENOBARB", "VALPROATE", "CBMZ"   Normal Head CT scan.  They asked whether further neuro work-up was indicated and there does not appear to be focal neurologic signs or symptoms to suggest that.  Genesight testing disc in detail.  Disc Mother's concerns with his lack of motivation and energy and lack of self  direction.  .res Assessment: Plan:    Rhyker was seen today for follow-up, fatigue, depression and medication problem.  Diagnoses and all orders for this visit:  Schizoaffective disorder, unspecified type (HCC) -     buPROPion (WELLBUTRIN XL) 150 MG 24 hr tablet; 1 in then AM for 4 days, then 2 in the AM  Social anxiety disorder  Generalized anxiety disorder  Insomnia due to mental condition   Headaches resolved  45 min video face to face time with patient  He has not had any problems with dose reduction in [place] from 234 to 117 mg as of 01/21/2023.Marland Kitchen  Psychotic sx still under control with still sig reduced eye contract but is able to make eye contact.   We discussed the importance of med compliance and how that can change future outcomes with psychotic symptoms as well as relapse risk.  We discussed side effects of antipsychotic medications.   Overall his psychotic symptoms are generally resolved..still neg sx of schizophrenia .  No hostility But complaining of more depression.  Since March 2024 [name] every 4 weeks to 117 mg q 4 weeks .  We discussed that it  can take several months for long-acting injectables to have optimal effect and reach steady state.  His psychosis is managed but is still sleeping a good bit.  It is too early to consider another dose reduction.  Extensive discussion around my opinion that he has a version of schizophrenia that is highly likely to recur if he stops all antipsychotic.  In addition if he has recurrence of psychosis it is not guarantee that he will be able to recover as fully after such an episode as he is currently.  Therefore I will not agree to stopping antipsychotic at any time in the foreseeable future.  We discussed options of switching to a less sedating antipsychotic.  We had an extensive discussion about the current side effects of antipsychotics that he is experiencing and the potential for switch but that is very risky. Disc  importance of preventing future psychosis if possible. We discussed that some of his complaints about blunting and lack of interest can be a function of his underlying psychotic illness or could be possibly a side effect of Invega which is a fairly potent antipsychotic.  I discussed specifically with his father today that the data clearly said warts the use of injectables to prevent recurrent psychosis versus oral medications.  However it can have the side effects as noted.  We can consider switching to one of the less sedating antipsychotic such as Vraylar or Latuda which also have some potential benefit for depression but they could fail to work to control the psychosis which would lead to a potential long drawn out recovery.  And sometimes patients never fully recover after repeated psychosis.  We discussed the pros and cons of this.  We discussed a safer option which might be to try Wellbutrin because he reports mild depression and Wellbutrin is generally activating and energizing.  It does not typically resolve negative symptoms of schizophrenia but it may help some of these symptoms noted and would not likely worsen his psychosis.  Consider checking prolactin  Ok clonazepam in place of hydroxyzine for anxiety midday 0.5 mg daily.  Consider alternatives DT SE Vraylar, Latuda, Caplyta.  Leafy Kindle is probably the least sedating with least DM risk.    Propranolol 20-40 mg before work to deal with anxiety and neg thoughts with stressful coworker.  Can take it regularly to help prevent HA.    Disc dietary issues and risk weight gain.  Discussed potential metabolic side effects associated with atypical antipsychotics, as well as potential risk for movement side effects. Advised pt to contact office if movement side effects occur.  Disc SE risk.   Tobacco use only rarely bc DDI.  Avoid THC.   Encourage exercise and stimulation.  We discussed again his pursuit of disability DT residual negative sx of  schizophrenia and fatigue DT meds interfering with his ability to work outside.  It is difficult to tell how much of his difficulty functioning is related to negative symptoms of schizophrenia versus mild depression versus his youth and general lack of motivation.  We also discussed however his overall quality of life would be much better in the long-term if he is able to pursue and maintain some gainful employment and this was encouraged.  Have discussed this extensively with his therapist as well since his last appointment.  He understood and as did his father.  We will do our best to continue to aggressively treat his symptoms in the hopes that he can be more functional. Has atty Pamalee Leyden for  this.   Continue counseling with Elio Forget  and work on Pharmacist, community and self care .  That is helpful  Support groups [facility], [facility], [facility].  Going to [employer] support group by Zoom a couple of times.  Faith based.  Safest option vs change antipsychotic is trial Wellbutrin XL 300 mg AM for dep and low motivation before switching to [employer].   Disc SE  FU 8 weeks  Meredith Staggers, MD, DFAPA   Please see After Visit Summary for patient specific instructions.  Future Appointments  Date Time Provider Department Center  10/20/2023  2:00 PM Waldron Session, [facility] CP-CP None  10/20/2023  3:00 PM CP-NURSE CP-CP None  11/05/2023  1:00 PM Waldron Session, [facility] CP-CP None  11/20/2023  2:30 PM CP-NURSE CP-CP None  11/20/2023  3:00 PM Waldron Session, [facility] CP-CP None  12/15/2023  2:00 PM Cottle, Steva Ready., MD CP-CP None    No orders of the defined types were placed in this encounter.    -------------------------------

## 2023-10-14 NOTE — Telephone Encounter (Signed)
Next appt is today. Scott Vargas called and wants to make sure that Dr. Jennelle Human sees this before his appointment. He is asking if he can have a prescription for Zoloft. He can pick this up while he is at appointment.

## 2023-10-17 ENCOUNTER — Telehealth: Payer: Self-pay

## 2023-10-17 NOTE — Telephone Encounter (Signed)
Patient called to report that he has no energy or motivation, wants to lay on the couch all day. He said he doesn't feel he can work right now. 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 He was just started on Wellbutrin this week and said he is feeling 15% better. I told him that it can take 4-6 weeks to reach full benefit and then dose can be increased if warranted. Told him he needs to give if more time. He is asking if Dr. Jennelle Human will prescribe a stimulant, feels he may have ADHD because his brother does. I told him that he needs to give the Wellbutrin more time. He is also on clonazepam BID and that it may be contraindicated. He kept asking about a stimulant and I told him that Dr. Jennelle Human would need to make that decision, but that he really needed to give the Wellbutrin more time. I told him if he felt he had benefit after just 3 days that he should only see further benefit. He reported a headache the first day he took it, but none since.  He said he would give the Wellbutrin more time.

## 2023-10-20 ENCOUNTER — Ambulatory Visit (INDEPENDENT_AMBULATORY_CARE_PROVIDER_SITE_OTHER): Payer: BC Managed Care – PPO

## 2023-10-20 ENCOUNTER — Ambulatory Visit (INDEPENDENT_AMBULATORY_CARE_PROVIDER_SITE_OTHER): Payer: BC Managed Care – PPO | Admitting: Mental Health

## 2023-10-20 DIAGNOSIS — F259 Schizoaffective disorder, unspecified: Secondary | ICD-10-CM

## 2023-10-20 NOTE — Progress Notes (Unsigned)
[facility] Psychotherapy Note  Name: Scott Vargas Date: 10/20/23 MRN: [id number] DOB: 02/18/1996 PCP: Noberto Retort, MD  Time spent: 51 minutes  Treatment:  individual therapy     Mental Status Exam:    Appearance:    Casual     Behavior:   Appropriate  Motor:   WNL  Speech/Language:    Clear and Coherent  Affect:   Full range   Mood:   Euthymic  Thought process:   normal  Thought content:     WNL  Sensory/Perceptual disturbances:     none  Orientation:   x4  Attention:   Good  Concentration:   Good  Memory:   Intact  Fund of knowledge:    Consistent with age and development  Insight:     Good  Judgment:    Good  Impulse Control:   Good   Reported Symptoms:  anxiety, depression, rumination, avoidant behavior (crowds at times)  Risk Assessment: Danger to Self:  No Self-injurious Behavior: No Danger to Others: No Duty to Warn:no Physical Aggression / Violence:No  Access to Firearms a concern: No  Gang Involvement:No  Patient / guardian was educated about steps to take if suicide or homicide risk level increases between visits: yes While future psychiatric events cannot be accurately predicted, the patient does not currently require acute inpatient psychiatric care and does not currently meet [facility] involuntary commitment criteria.  Diagnoses:  No diagnosis found.   Subjective:   Patient presented on time for today's session.  Patient verbally consented to have his father join session.  Father shared observations, how patient continues to cope with more frequent fatigue, increased weight, and lower energy levels as compared to years before.  He stated that he is also been coping with some depression which was also discussed at his previous med management appointment.  They are hopeful that his medications will be effective in continuing to manage his history of psychosis as well as more recent depressed mood.  Father stated that he and the family  continue to be supportive, expressing also the challenges patient has had to endure over the last few years since his initial hospitalization. In meeting with patient individually, continue to maintain rapport and focus on changes he would like to continue to work toward.  He stated that he continues to struggle with trying to engage in exercise regimen, feels his medications do play a role in his fatigue.  He expressed concern about his ability to maintain some levels of functioning particularly when considering his past levels prior to being on medications.    Interventions:   Motivational interviewing, supportive therapy  Plan: Patient is to use CBT, mindfulness and coping skills to help decrease symptoms associated with their diagnosis.  Patient continues to embrace ways to take steps toward his independence and self acceptance.    Long-term goal:   Reduce overall level, frequency, and intensity of the feelings of anxiety in social situations from every occurrence causing anxiety to 1 our of 5 occurrences.  Decrease feelings of sadness and anxiety related to life stressors.   Short-term goal:  Patient to continue to work on identifying thoughts that make his anxiety increased in social situations and to continue to allow himself to engage in social situations to continue to try and work past his anxiety.   Patient to ground himself with rational, calming self talk when having intrusive thoughts. Increased feelings of self-worth by taking steps toward achieving more independence by finding and  maintaining employment  Assessment of progress:  progressing   Waldron Session, [facility]

## 2023-10-20 NOTE — Progress Notes (Signed)
NURSES NOTE:    Pt arrived for his monthly LAI of [place]. Pt given injection of [place] 117mg /0.75 ml in his left upper gluteal area. He tolerated well. His last dose was of [place]  was on 09/23/2023. [facility]. Pt already scheduled his next injection on 11/23/2023. Pt had apt with Elio Forget before injection today, and he discussed that some.        LOT [id number] EXP JAN 2026 NDC [id number]

## 2023-10-21 ENCOUNTER — Other Ambulatory Visit: Payer: Self-pay | Admitting: Psychiatry

## 2023-10-21 DIAGNOSIS — F259 Schizoaffective disorder, unspecified: Secondary | ICD-10-CM

## 2023-10-27 ENCOUNTER — Telehealth: Payer: Self-pay | Admitting: Psychiatry

## 2023-10-27 NOTE — Telephone Encounter (Signed)
I advised him that he needs to finish the rx. He says that he only has 4 days left in his rx that's why he was calling.  The rx was written for qty 30, 1 daily for 4 days then 2 daily there after. He began taking 12/18 he has 10 pills left.

## 2023-10-27 NOTE — Telephone Encounter (Signed)
He has only been on 300 mg for 9 days.  That is not long enough to get the full benefit.  Needs to take the remainder of the RX before we consider increasing further.

## 2023-10-27 NOTE — Telephone Encounter (Signed)
Pt called at 11:15a stating that the Wellbutring is "going well".  He said he would like a higher dosage if Dr Jennelle Human is ok to prescribe it.  He has about 15 days left.  He would like the refill sent to   [id number] [place], [place] - [address] AT [facility] [address] [place] [place] [address] Phone: [phone]  Fax: [phone]   Next appt 2/17

## 2023-10-30 ENCOUNTER — Other Ambulatory Visit: Payer: Self-pay

## 2023-10-30 ENCOUNTER — Telehealth: Payer: Self-pay

## 2023-10-30 ENCOUNTER — Other Ambulatory Visit: Payer: Self-pay | Admitting: Psychiatry

## 2023-10-30 DIAGNOSIS — F4010 Social phobia, unspecified: Secondary | ICD-10-CM

## 2023-10-30 DIAGNOSIS — F259 Schizoaffective disorder, unspecified: Secondary | ICD-10-CM

## 2023-10-30 DIAGNOSIS — F411 Generalized anxiety disorder: Secondary | ICD-10-CM

## 2023-10-30 MED ORDER — CLONAZEPAM 0.5 MG PO TABS: 0.5000 mg | ORAL_TABLET | Freq: Two times a day (BID) | ORAL | 1 refills | Status: DC

## 2023-10-30 MED ORDER — BUPROPION HCL ER (XL) 300 MG PO TB24: 300.0000 mg | ORAL_TABLET | Freq: Every day | ORAL | 0 refills | Status: DC

## 2023-10-30 NOTE — Telephone Encounter (Signed)
 Pt called  and said that he only has 2 pills left of his wellbutrin. He wants to go up to 300 mg. He also needs a refill on his klonopin as well

## 2023-10-31 ENCOUNTER — Other Ambulatory Visit: Payer: Self-pay | Admitting: Psychiatry

## 2023-11-05 ENCOUNTER — Ambulatory Visit: Payer: BC Managed Care – PPO | Admitting: Mental Health

## 2023-11-05 DIAGNOSIS — F259 Schizoaffective disorder, unspecified: Secondary | ICD-10-CM | POA: Diagnosis not present

## 2023-11-05 NOTE — Progress Notes (Signed)
 [facility] Psychotherapy Note  Name: Scott Vargas Date: 10/20/23 MRN: [id number] DOB: November 27, 1995 PCP: Arloa Elsie SAUNDERS, MD  Time spent: 51 minutes  Treatment:  individual therapy     Mental Status Exam:    Appearance:    Casual     Behavior:   Appropriate  Motor:   WNL  Speech/Language:    Clear and Coherent  Affect:   Full range   Mood:   Euthymic  Thought process:   normal  Thought content:     WNL  Sensory/Perceptual disturbances:     none  Orientation:   x4  Attention:   Good  Concentration:   Good  Memory:   Intact  Fund of knowledge:    Consistent with age and development  Insight:     Good  Judgment:    Good  Impulse Control:   Good   Reported Symptoms:  anxiety, depression, rumination, avoidant behavior (crowds at times)  Risk Assessment: Danger to Self:  No Self-injurious Behavior: No Danger to Others: No Duty to Warn:no Physical Aggression / Violence:No  Access to Firearms a concern: No  Gang Involvement:No  Patient / guardian was educated about steps to take if suicide or homicide risk level increases between visits: yes While future psychiatric events cannot be accurately predicted, the patient does not currently require acute inpatient psychiatric care and does not currently meet [facility]  involuntary commitment criteria.  Diagnoses:    ICD-10-CM   1. Schizoaffective disorder, unspecified type (HCC)  F25.9        Subjective:   Patient presented on time for today's session.   He feels the Wellbutrin  has improved his mood and he has substantially more energy. He is now working out again. He reports getting better sleep, less sleeping excessively. He has changed his diet and has reduced his blood sugar; he also has lost weight, now down to 200 lbs.  Plans to continue to pursue IT classes, recently got approved for starting college. He continues to have 3 friends he talks with. He has been socializing by going more to church recently.   He recognizes this can help w/ his social anxiety.  He expresses motivation to continue to work on making changes, is hopeful about this year being 1 with more positive changes in his life.  He stated that he continues to want to date but wants to make this less about focus.  He wants to continue doing maintain the friendships he has, be open to dating someone if this occurs but also to give himself time to focus his self-care.  Facilitated his identifying needs, clarifying hopes and expectations as well as identifying feelings.   Interventions:   Motivational interviewing, supportive therapy  Plan: Patient is to use CBT, mindfulness and coping skills to help decrease symptoms associated with their diagnosis.  Patient continues to embrace ways to take steps toward his independence and self acceptance.    Long-term goal:   Reduce overall level, frequency, and intensity of the feelings of anxiety in social situations from every occurrence causing anxiety to 1 our of 5 occurrences.  Decrease feelings of sadness and anxiety related to life stressors.   Short-term goal:  Patient to continue to work on identifying thoughts that make his anxiety increased in social situations and to continue to allow himself to engage in social situations to continue to try and work past his anxiety.   Patient to ground himself with rational, calming self talk when having intrusive thoughts. Increased  feelings of self-worth by taking steps toward achieving more independence by finding and maintaining employment  Assessment of progress:  progressing   Lonni Fischer, [facility]

## 2023-11-17 ENCOUNTER — Telehealth: Payer: Self-pay | Admitting: Psychiatry

## 2023-11-17 ENCOUNTER — Other Ambulatory Visit: Payer: Self-pay

## 2023-11-17 DIAGNOSIS — F259 Schizoaffective disorder, unspecified: Secondary | ICD-10-CM

## 2023-11-17 MED ORDER — INVEGA SUSTENNA 117 MG/0.75ML IM SUSY: 117.0000 mg | PREFILLED_SYRINGE | INTRAMUSCULAR | 2 refills | Status: DC

## 2023-11-17 NOTE — Telephone Encounter (Signed)
Scott Vargas called at 9:30 to report 1st that the Wellbutrin is fantastic! He would like to be on the maximum dose to get the maximum effect.  2nd he would like to taper off the Invega injection and start the Vraylar as you had discussed.  He will take the Invega injection on the 23rd as scheduled but then would like to taper off or be put on the lowest dose and supplement with Invega pills is needed.  Next appt is 2/17.  If sending a prescription of Vraylar [facility] to  [id number] [place], [place] - [address] AT [address]

## 2023-11-18 NOTE — Telephone Encounter (Signed)
I just started Wellbutrin at his last visit.  We only change one med at a time.  I will not change the Invega and start Vraylar between appts.  He has to wait until his next appt.  If he refuses the Invega next injection it will be against my medical advice and I will DC from this practice and he will have to find another psychiatrist bc I consider it dangerous for him to do so.

## 2023-11-18 NOTE — Telephone Encounter (Signed)
Scott Vargas reports that his invega 117 mg has disabled him. 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  He wants to be tapered to Invega 79 mg and then the next lower dose. He wants to be weaned off  completely to see how he functions without it. He would like to see you sooner than 2/17 to discuss this further. He does not want to be administered the next dose of Invega 117 mg  He mentions that Wellbutrin 300 mg has been beneficial. He has more motivation, happy again. He is down to 200 lb since Christmas, which he weighed 213 then. He became tearful when talking about the Wellbutrin. He said that he knows its not the max dose and might do better if he were on the max dose.   He was talking very fast.   Please advise.

## 2023-11-18 NOTE — Telephone Encounter (Signed)
Patient called in regarding Invega he would like to lower dosage as it is to much for him and he doesn't like the side effects. He inquired about an "emergency appt with CC to discuss this. Ph: [phone] Appt 2/14

## 2023-11-19 DIAGNOSIS — F2081 Schizophreniform disorder: Secondary | ICD-10-CM | POA: Diagnosis not present

## 2023-11-19 NOTE — Telephone Encounter (Signed)
I did tell him that you would not make adjustments between his appts. I read exactly what your note said. That's when he said that he would seek other opinions if you didn't work with him.

## 2023-11-19 NOTE — Telephone Encounter (Signed)
Reviewed and discussed with Dr. Jennelle Human. Will discuss further with pt tomorrow at his injection apt

## 2023-11-19 NOTE — Telephone Encounter (Signed)
To be clear.  I did not say I would not ever switch him off Invega; only I would not agree to do it between appts.  Must discuss it at appt.  I am relieved to hear he will accept his next injection as planned.

## 2023-11-19 NOTE — Telephone Encounter (Signed)
I am not sure what to say about this. I also believe pt should continue with Invega injection like he has been for years at least for now until Dr. Jennelle Human can make his medication adjustments slowly. Stopping his injection now will cause greater risk of him having another psychotic break.

## 2023-11-19 NOTE — Telephone Encounter (Signed)
Pt notified. He said that he does on plan on getting his injection on Thursday after talking it over with is parents. He stated that if you were not willing to work with him and weaning off Hinda Glatter, he will seek a second opinion.

## 2023-11-20 ENCOUNTER — Ambulatory Visit: Payer: BC Managed Care – PPO

## 2023-11-20 ENCOUNTER — Ambulatory Visit (INDEPENDENT_AMBULATORY_CARE_PROVIDER_SITE_OTHER): Payer: BC Managed Care – PPO | Admitting: Mental Health

## 2023-11-20 DIAGNOSIS — F259 Schizoaffective disorder, unspecified: Secondary | ICD-10-CM | POA: Diagnosis not present

## 2023-11-20 NOTE — Progress Notes (Signed)
[facility] Psychotherapy Note  Name: Scott Vargas Date: 11/20/23 MRN: [id number] DOB: 01-05-1996 PCP: Noberto Retort, MD  Time spent: 50 minutes  Treatment:  individual therapy  Virtual Visit via Telehealth Note Connected with patient by a telemedicine/telehealth application, with their informed consent, and verified patient privacy and that I am speaking with the correct person using two identifiers. I discussed the limitations, risks, security and privacy concerns of performing psychotherapy and the availability of in person appointments. I also discussed with the patient that there may be a patient responsible charge related to this service. The patient expressed understanding and agreed to proceed. I discussed the treatment planning with the patient. The patient was provided an opportunity to ask questions and all were answered. The patient agreed with the plan and demonstrated an understanding of the instructions. The patient was advised to call  our office if  symptoms worsen or feel they are in a crisis state and need immediate contact.   Therapist Location: office Patient Location: home      Mental Status Exam:    Appearance:    Casual     Behavior:   Appropriate  Motor:   WNL  Speech/Language:    Clear and Coherent  Affect:   Full range   Mood:   Euthymic  Thought process:   normal  Thought content:     WNL  Sensory/Perceptual disturbances:     none  Orientation:   x4  Attention:   Good  Concentration:   Good  Memory:   Intact  Fund of knowledge:    Consistent with age and development  Insight:     Good  Judgment:    Good  Impulse Control:   Good   Reported Symptoms:  anxiety, depression, rumination, avoidant behavior (crowds at times)  Risk Assessment: Danger to Self:  No Self-injurious Behavior: No Danger to Others: No Duty to Warn:no Physical Aggression / Violence:No  Access to Firearms a concern: No  Gang Involvement:No  Patient / guardian was  educated about steps to take if suicide or homicide risk level increases between visits: yes While future psychiatric events cannot be accurately predicted, the patient does not currently require acute inpatient psychiatric care and does not currently meet [facility] involuntary commitment criteria.  Diagnoses:    ICD-10-CM   1. Schizoaffective disorder, unspecified type (HCC)  F25.9         Subjective:   Patient engaged in telehealth session via video.  Assessed progress.  Patient stated that he has started to not take his monthly injection which was scheduled for today.  He stated that he has given thought over the past several days and spoke with his parents yesterday about wanting to discontinue the injection and possibly change to another medication that would not have the side effects of an antipsychotic.  Went on to share side effects and his disheartening to make this change.  He stated that he is planning to attend a community college and take IT classes in the coming months.  He acknowledges the challenges of his mental health in the past, needing psychiatric inpatient however he further identified the need to take this step in the house that he will have her energy levels and decrease anhedonia which he feels he has been coping with for the past few years.  We cautioned patient the potential outcome of making this decision, that he could have a return of symptoms, where he stated that he understands but plans to take  the steps.  He stated that his parents were understanding and agreed that they will take him to the hospital if he begins to have symptoms again.  Patient stated he will also plans to be forthcoming with him if symptoms begin to return.  He continues to deny intrusive thoughts, denies any paranoid thinking.  He stated that he has been more consistent with self-care in the way of exercise, going for walks and expresses intentions to continue exercise. He plans to follow up at  [facility] for mental health for continued psychiatric care.  Invited patient to contact our office between sessions if needed where he agreed.   Interventions:   Motivational interviewing, supportive therapy  Plan: Patient is to use CBT, mindfulness and coping skills to help decrease symptoms associated with their diagnosis.  Patient to continue to identify ways to cope and care for himself, attend doctor appointments about following through with his primary care physician as well as his psychiatry appointments.    Long-term goal:   Reduce overall level, frequency, and intensity of the feelings of anxiety in social situations for up to 3 months consecutively.   Decrease feelings of sadness and anxiety related to life stressors.   Short-term goal:  Patient to continue to work on identifying thoughts that make his anxiety increased in social situations and to continue to allow himself to engage in social situations to continue to try and work past his anxiety.   Patient to ground himself with rational, calming self talk when having intrusive thoughts. Increased feelings of self-worth by taking steps toward achieving more independence by finding and maintaining employment  Assessment of progress:  progressing   Waldron Session, [facility]

## 2023-11-21 ENCOUNTER — Other Ambulatory Visit: Payer: Self-pay | Admitting: Psychiatry

## 2023-11-21 NOTE — Progress Notes (Signed)
Pt is acting impulsively after starting bupropion. Unsure if related but will stop it.

## 2023-11-24 ENCOUNTER — Other Ambulatory Visit: Payer: Self-pay | Admitting: Psychiatry

## 2023-11-24 ENCOUNTER — Telehealth: Payer: Self-pay | Admitting: Psychiatry

## 2023-11-24 DIAGNOSIS — F259 Schizoaffective disorder, unspecified: Secondary | ICD-10-CM

## 2023-11-24 MED ORDER — PALIPERIDONE ER 3 MG PO TB24
3.0000 mg | ORAL_TABLET | Freq: Every day | ORAL | 1 refills | Status: DC
Start: 2023-11-24 — End: 2024-05-16

## 2023-11-24 NOTE — Telephone Encounter (Signed)
Dad, Thyra Breed called at 3:40 stating that Scott Vargas has refused to take the invega injections as already stated in the early message.  He states he is experiencing withdrawal symptoms and he too asked for the oral 3mg  invega tablets to help mediate the withdrawal symptoms.  Dad is calling to emphasize the seriousness of the matter.

## 2023-11-24 NOTE — Progress Notes (Signed)
Patient is changing providers.  He refuses to take long-acting injectable Invega any longer and wants to switch to the short acting while he is waiting on an appointment for his new provider.  Based on dosing equivalence we will switch him to 3 mg Invega nightly until he sees his new doctor.  He has been informed of higher relapse rates on oral vs LAI but wishes to precede anyway. Meredith Staggers, MD, DFAPA

## 2023-11-24 NOTE — Telephone Encounter (Signed)
Called patient and he had dad on speaker. Told him that the Rx for Invega had been sent. He said to tell Dr. Jennelle Human he appreciated it.

## 2023-11-24 NOTE — Telephone Encounter (Signed)
Pt also canceled any injections already scheduled with me and states he is no longer taking and changing practices.   Now asking for an oral supplement for [facility]

## 2023-11-24 NOTE — Telephone Encounter (Signed)
Dr. Jennelle Human [facility] [place]

## 2023-11-24 NOTE — Telephone Encounter (Signed)
Pt called at 10:39p.  He is asking if Dr Jennelle Human will write a script for him for the oral tablet of Invega until he is able to see a new Dr.  He said he has an appt with [facility] (first he said a month then he said 2)...but he said he would just like to get something to take so he doesn't start to have withdrawal symptoms and end up being re-hospitialized. He said the only thing that is happening right now is he his having lots of "actiive dreams, but no nightmares".    Pls send script to  [id number] - [place], [place] - [address] AT [facility] [address] [place] [address] Phone: [phone]  Fax: [phone]   No upcoming appts scheduled.

## 2023-11-24 NOTE — Telephone Encounter (Signed)
Patient is changing providers.  He refuses to take long-acting injectable Invega any longer and wants to switch to the short acting while he is waiting on an appointment for his new provider.  Based on dosing equivalence we will switch him to 3 mg Invega nightly until he sees his new doctor.  He has been informed of higher relapse rates on oral vs LAI but wishes to precede anyway. RX sent  Meredith Staggers, MD, DFAPA

## 2023-11-25 ENCOUNTER — Other Ambulatory Visit: Payer: Self-pay

## 2023-11-27 ENCOUNTER — Telehealth: Payer: Self-pay | Admitting: Psychiatry

## 2023-11-27 NOTE — Telephone Encounter (Signed)
Pt called after hours emergency line with message requesting Invega po RX he previously requested to be changed to Zoloft "to get off antipsychotic".  This call was not returned after hours. Inform him this is inappropriate use of after hours call service which is for emergencies only.  This is not an emergency. Also his request is medically inappropriate to substitute an antidepressant sertraline for an antipsychotic so I will not call in Zoloft. He should remain on the oral Invega until he sees his new psychiatrist otherwise he risks recurring psychosis.  Meredith Staggers, MD, DFAPA

## 2023-11-28 NOTE — Telephone Encounter (Signed)
Just [facility]pe-

## 2023-11-28 NOTE — Telephone Encounter (Signed)
Rtc to patient to clarify his request, pt reports he has some friends that take Zoloft and it helps them. Informed him Zoloft and Hinda Glatter are in two different classes and one wouldn't replace the other. He understood. Pt does report he took [place] oral tablet last night and it definitely helped slow down his mind. Pt reports he has apt next door at [facility] on 12/18/23 with a NP. Advised him to discuss Zoloft with her as well. Pt reports he is having physical withdrawal symptoms but informed him he really shouldn't be having any of that from not getting the injection last week. Pt reports having brain fog, headache, tightening in his throat when swallowing. Pt also reports having a "buzzing" in his brain but the invega tablet helped. Pt reports its a lot easier to take a pill then come get a shot. Informed him to call back with further concerns until he has his new provider. Pt wanted to thank Dr. Jennelle Human for sending in the Invega tablets.   He will see [name] on 2/11.

## 2023-11-28 NOTE — Telephone Encounter (Signed)
Noted.  He needs to stay on antipsychotic or risk of severe recurrent psychosis with potentially long-lasting complications.  Pt has been informed of this repeatedly as has his father. Meredith Staggers, MD, DFAPA

## 2023-12-01 DIAGNOSIS — F2081 Schizophreniform disorder: Secondary | ICD-10-CM | POA: Diagnosis not present

## 2023-12-09 ENCOUNTER — Ambulatory Visit: Payer: BC Managed Care – PPO | Admitting: Mental Health

## 2023-12-09 DIAGNOSIS — F259 Schizoaffective disorder, unspecified: Secondary | ICD-10-CM | POA: Diagnosis not present

## 2023-12-09 NOTE — Progress Notes (Signed)
[facility] Psychotherapy Note  Name: Scott Vargas Date:  12/09/23 MRN: [id number] DOB: 1996/06/14 PCP: Noberto Retort, MD  Time spent: 41 minutes  Treatment:  individual therapy  Virtual Visit via Telehealth Note Connected with patient by a telemedicine/telehealth application, with their informed consent, and verified patient privacy and that I am speaking with the correct person using two identifiers. I discussed the limitations, risks, security and privacy concerns of performing psychotherapy and the availability of in person appointments. I also discussed with the patient that there may be a patient responsible charge related to this service. The patient expressed understanding and agreed to proceed. I discussed the treatment planning with the patient. The patient was provided an opportunity to ask questions and all were answered. The patient agreed with the plan and demonstrated an understanding of the instructions. The patient was advised to call  our office if  symptoms worsen or feel they are in a crisis state and need immediate contact.   Therapist Location: office Patient Location: home      Mental Status Exam:    Appearance:    Casual     Behavior:   Appropriate  Motor:   WNL  Speech/Language:    Clear and Coherent  Affect:   Full range   Mood:   Euthymic  Thought process:   normal  Thought content:     WNL  Sensory/Perceptual disturbances:     none  Orientation:   x4  Attention:   Good  Concentration:   Good  Memory:   Intact  Fund of knowledge:    Consistent with age and development  Insight:     Good  Judgment:    Good  Impulse Control:   Good   Reported Symptoms:  anxiety, depression, rumination, avoidant behavior (crowds at times)  Risk Assessment: Danger to Self:  No Self-injurious Behavior: No Danger to Others: No Duty to Warn:no Physical Aggression / Violence:No  Access to Firearms a concern: No  Gang Involvement:No  Patient / guardian  was educated about steps to take if suicide or homicide risk level increases between visits: yes While future psychiatric events cannot be accurately predicted, the patient does not currently require acute inpatient psychiatric care and does not currently meet [facility] involuntary commitment criteria.  Diagnoses:    ICD-10-CM   1. Schizoaffective disorder, unspecified type (HCC)  F25.9          Subjective:   Patient engaged in telehealth session via video.   Reports withdrawal symptoms from discontinuing the Invega about a month ago over the last 2 weeks which include difficulty swallowing, nausea, brain fog, buzzing sensation, problems concentrating. These symptoms of decreased over the last few days he believes in part due to his PCP prescribing a low dose of Invega daily to reduce the withdrawal symptoms. He has an appointment with his psychiatrist in 9 days [facility].  Encouraged patient to contact his doctors with any questions or concerns in the interim. He reports feeling more depressed recently, over the weekend.  He stated that he feels this is in part due to his taking a friend to the hospital and this reminding him of his being inpatient a few years ago.  He stated that he has tried to be supportive to his friend, that they are currently staying with him as he is currently at his grandfather's house for the next few days.  Facilitated his identifying associated cognitions where he identified some of the sadness. "I've wasted  the last 3 years". Patient was encouraged to recognize ways he is trying to improve his situation, functioning.  Acknowledges this and reports that he has been socializing more, going to church, got a job a part-time job recently and plans to potentially start toward the end of this month.  He also has started jujitsu classes and plans to go with some consistency per his finances. He denies also all psychotic symptoms. Sleep is intact.  Where he  is getting about 9-10 hours/night.  He plans to continue to use his support system which primarily consists of family.   Interventions:   Motivational interviewing, supportive therapy, CBT  Plan: Patient is to use CBT, mindfulness and coping skills to help decrease symptoms associated with their diagnosis.  Patient to continue to identify ways to cope and care for himself, attend doctor appointments about following through with his primary care physician as well as his psychiatry appointments.    Long-term goal:   Reduce overall level, frequency, and intensity of the feelings of anxiety in social situations for up to 3 months consecutively.   Decrease feelings of sadness and anxiety related to life stressors.   Short-term goal:  Patient to continue to work on identifying thoughts that make his anxiety increased in social situations and to continue to allow himself to engage in social situations to continue to try and work past his anxiety.   Patient to ground himself with rational, calming self talk when having intrusive thoughts. Increased feelings of self-worth by taking steps toward achieving more independence by finding and maintaining employment  Assessment of progress:  progressing   Waldron Session, [facility]

## 2023-12-15 ENCOUNTER — Telehealth: Payer: BC Managed Care – PPO | Admitting: Psychiatry

## 2023-12-19 DIAGNOSIS — F250 Schizoaffective disorder, bipolar type: Secondary | ICD-10-CM | POA: Diagnosis not present

## 2023-12-19 DIAGNOSIS — Z5181 Encounter for therapeutic drug level monitoring: Secondary | ICD-10-CM | POA: Diagnosis not present

## 2023-12-19 DIAGNOSIS — F411 Generalized anxiety disorder: Secondary | ICD-10-CM | POA: Diagnosis not present

## 2023-12-24 ENCOUNTER — Ambulatory Visit: Payer: BC Managed Care – PPO

## 2023-12-24 ENCOUNTER — Ambulatory Visit: Payer: BC Managed Care – PPO | Admitting: Mental Health

## 2023-12-24 DIAGNOSIS — F259 Schizoaffective disorder, unspecified: Secondary | ICD-10-CM

## 2023-12-24 NOTE — Progress Notes (Signed)
 [facility] Psychotherapy Note  Name: Scott Vargas Date:  12/24/23 MRN: [id number] DOB: 01-03-1996 PCP: Noberto Retort, MD  Time spent: 48 minutes  Treatment:  individual therapy  Virtual Visit via Telehealth Note Connected with patient by a telemedicine/telehealth application, with their informed consent, and verified patient privacy and that I am speaking with the correct person using two identifiers. I discussed the limitations, risks, security and privacy concerns of performing psychotherapy and the availability of in person appointments. I also discussed with the patient that there may be a patient responsible charge related to this service. The patient expressed understanding and agreed to proceed. I discussed the treatment planning with the patient. The patient was provided an opportunity to ask questions and all were answered. The patient agreed with the plan and demonstrated an understanding of the instructions. The patient was advised to call  our office if  symptoms worsen or feel they are in a crisis state and need immediate contact.   Therapist Location: office Patient Location: home   Mental Status Exam:    Appearance:    Casual     Behavior:   Appropriate  Motor:   WNL  Speech/Language:    Clear and Coherent  Affect:   Full range   Mood:   Euthymic  Thought process:   normal  Thought content:     WNL  Sensory/Perceptual disturbances:     none  Orientation:   x4  Attention:   Good  Concentration:   Good  Memory:   Intact  Fund of knowledge:    Consistent with age and development  Insight:     Good  Judgment:    Good  Impulse Control:   Good   Reported Symptoms:  anxiety, depression, rumination, avoidant behavior (crowds at times)  Risk Assessment: Danger to Self:  No Self-injurious Behavior: No Danger to Others: No Duty to Warn:no Physical Aggression / Violence:No  Access to Firearms a concern: No  Gang Involvement:No  Patient / guardian was  educated about steps to take if suicide or homicide risk level increases between visits: yes While future psychiatric events cannot be accurately predicted, the patient does not currently require acute inpatient psychiatric care and does not currently meet [facility] involuntary commitment criteria.  Diagnoses:    ICD-10-CM   1. Schizoaffective disorder, unspecified type (HCC)  F25.9       Subjective:  Patient engaged in telehealth session via video.  Assessed progress since last visit.  Patient stated he followed up with his psychiatric med management appointment at [facility] with Chester Holstein, NP.  He stated the appointment went well, his antipsychotic medication was increased from 1.5 mg to 3 mg.  He shared how the doctor stated that this dose is needed at a minimum to have any effectiveness.  He reports symptoms only of increased tension in his forehead and temples and some brain fog, some difficulty concentrating at times.  His been about 4 days since his last visit and he plans to follow up at his next appointment which is about 4 weeks ago.  He denies all other symptoms related to psychosis.  He reports continuing to have increased energy levels also reports some agitation, difficulty relaxing at times typically in the evenings.  He stated he recently attended a mixed martial arts practice where he was able to exercise and spar, he plans to continue.  He also reports how he is about to start his 2 jobs, both part-time 1 of which  will help in providing some insurance which will help financially.  Other changes he has noticed is having more of a range of emotion, experiencing the adrenaline of engaging in sparring at his class.  He continues to cope with some levels of social anxiety where we explored experiences in the past and some presently, he feels if he gets one of the jobs particularly if one of is around a lot of people such as fast food he may have some anxiety but he has done this  in the past and feels confident he can handle shift or 2/week.  Reviewed ways to cope and care for himself, getting adequate rest, which he states he is, attending his doctor appointments.  Interventions:   Motivational interviewing, supportive therapy, CBT  Plan: Patient is to use CBT, mindfulness and coping skills to help decrease symptoms associated with their diagnosis.  Patient to continue to identify ways to cope and care for himself, attend doctor appointments about following through with his primary care physician as well as his psychiatry appointments.    Long-term goal:   Reduce overall level, frequency, and intensity of the feelings of anxiety in social situations for up to 3 months consecutively.   Decrease feelings of sadness and anxiety related to life stressors.   Short-term goal:  Patient to continue to work on identifying thoughts that make his anxiety increased in social situations and to continue to allow himself to engage in social situations to continue to try and work past his anxiety.   Patient to ground himself with rational, calming self talk when having intrusive thoughts. Increased feelings of self-worth by taking steps toward achieving more independence by finding and maintaining employment  Assessment of progress:  progressing   Waldron Session, [facility]

## 2024-01-08 ENCOUNTER — Ambulatory Visit: Payer: BC Managed Care – PPO | Admitting: Mental Health

## 2024-01-08 DIAGNOSIS — F259 Schizoaffective disorder, unspecified: Secondary | ICD-10-CM | POA: Diagnosis not present

## 2024-01-08 NOTE — Progress Notes (Signed)
 [facility] Psychotherapy Note  Name: Scott Vargas Date:  01/08/24 MRN: [id number] DOB: 1996/03/15 PCP: Noberto Retort, MD  Time spent: 47 minutes  Treatment:  individual therapy  Virtual Visit via Telehealth Note Connected with patient by a telemedicine/telehealth application, with their informed consent, and verified patient privacy and that I am speaking with the correct person using two identifiers. I discussed the limitations, risks, security and privacy concerns of performing psychotherapy and the availability of in person appointments. I also discussed with the patient that there may be a patient responsible charge related to this service. The patient expressed understanding and agreed to proceed. I discussed the treatment planning with the patient. The patient was provided an opportunity to ask questions and all were answered. The patient agreed with the plan and demonstrated an understanding of the instructions. The patient was advised to call  our office if  symptoms worsen or feel they are in a crisis state and need immediate contact.   Therapist Location: office Patient Location: home   Mental Status Exam:    Appearance:    Casual     Behavior:   Appropriate  Motor:   WNL  Speech/Language:    Clear and Coherent  Affect:   Full range   Mood:   Euthymic  Thought process:   normal  Thought content:     WNL  Sensory/Perceptual disturbances:     none  Orientation:   x4  Attention:   Good  Concentration:   Good  Memory:   Intact  Fund of knowledge:    Consistent with age and development  Insight:     Good  Judgment:    Good  Impulse Control:   Good   Reported Symptoms:  anxiety, depression, rumination, avoidant behavior (crowds at times)  Risk Assessment: Danger to Self:  No Self-injurious Behavior: No Danger to Others: No Duty to Warn:no Physical Aggression / Violence:No  Access to Firearms a concern: No  Gang Involvement:No  Patient / guardian was  educated about steps to take if suicide or homicide risk level increases between visits: yes While future psychiatric events cannot be accurately predicted, the patient does not currently require acute inpatient psychiatric care and does not currently meet [facility] involuntary commitment criteria.  Diagnoses:    ICD-10-CM   1. Schizoaffective disorder, unspecified type (HCC)  F25.9        Subjective:  Patient engaged in telehealth session via video. shared progress, how he has continued to take his medication consistently and feels has been working well. He denies any symptoms as psychosis and other side effects as discussed last session have discontinued. He has been working his part-time job, working about 50 hours per week over the last 2 weeks. He stated that it's been fulfilling, he's also enjoyed meeting coworkers and having some social interaction. 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 He is considering applying for Medicaid to allow him to have health care benefits. Other outlets for stress management and health and wellness were shared, his steps to be more active with exercise, engaging in jujitsu classes which he founds enjoyable.     Interventions:   Motivational interviewing, supportive therapy, CBT  Plan: Patient is to use CBT, mindfulness and coping skills to help decrease symptoms associated with their diagnosis.  Patient to  continue to identify ways to cope and care for himself, attend doctor appointments about following through with his primary care physician as well as his psychiatry appointments.    Long-term goal:   Reduce overall level, frequency, and intensity of the feelings of anxiety in social situations for up to 3 months consecutively.   Decrease feelings of sadness and anxiety related to life  stressors.   Short-term goal:  Patient to continue to work on identifying thoughts that make his anxiety increased in social situations and to continue to allow himself to engage in social situations to continue to try and work past his anxiety.   Patient to ground himself with rational, calming self talk when having intrusive thoughts. Increased feelings of self-worth by taking steps toward achieving more independence by finding and maintaining employment  Assessment of progress:  progressing   Waldron Session, [facility]

## 2024-01-22 ENCOUNTER — Ambulatory Visit: Payer: BC Managed Care – PPO | Admitting: Mental Health

## 2024-01-22 DIAGNOSIS — F259 Schizoaffective disorder, unspecified: Secondary | ICD-10-CM | POA: Diagnosis not present

## 2024-01-22 NOTE — Progress Notes (Signed)
 [facility] Psychotherapy Note  Name: Scott Vargas Date:  01/22/24 MRN: [id number] DOB: [birth year] PCP: Noberto Retort, MD  Time spent: 48 minutes  Treatment:  individual therapy  Virtual Visit via Telehealth Note Connected with patient by a telemedicine/telehealth application, with their informed consent, and verified patient privacy and that I am speaking with the correct person using two identifiers. I discussed the limitations, risks, security and privacy concerns of performing psychotherapy and the availability of in person appointments. I also discussed with the patient that there may be a patient responsible charge related to this service. The patient expressed understanding and agreed to proceed. I discussed the treatment planning with the patient. The patient was provided an opportunity to ask questions and all were answered. The patient agreed with the plan and demonstrated an understanding of the instructions. The patient was advised to call  our office if  symptoms worsen or feel they are in a crisis state and need immediate contact.   Therapist Location: office Patient Location: home   Mental Status Exam:    Appearance:    Casual     Behavior:   Appropriate  Motor:   WNL  Speech/Language:    Clear and Coherent  Affect:   Full range   Mood:   Euthymic  Thought process:   normal  Thought content:     WNL  Sensory/Perceptual disturbances:     none  Orientation:   x4  Attention:   Good  Concentration:   Good  Memory:   Intact  Fund of knowledge:    Consistent with age and development  Insight:     Good  Judgment:    Good  Impulse Control:   Good   Reported Symptoms:  anxiety, depression, rumination, avoidant behavior (crowds at times)  Risk Assessment: Danger to Self:  No Self-injurious Behavior: No Danger to Others: No Duty to Warn:no Physical Aggression / Violence:No  Access to Firearms a concern: No  Gang Involvement:No  Patient / guardian was  educated about steps to take if suicide or homicide risk level increases between visits: yes While future psychiatric events cannot be accurately predicted, the patient does not currently require acute inpatient psychiatric care and does not currently meet [facility] involuntary commitment criteria.  Diagnoses:    ICD-10-CM   1. Schizoaffective disorder, unspecified type (HCC)  F25.9         Subjective:  Patient engaged in telehealth session via video.  Assessed progress where patient shared how he continues to work his part-time job getting close to full-time hours most weeks.  He stated that he is arrived at the position, has been relied on upon management.  He stated that he wants to continue to work on improving however as he has had a couple of customer complaints which are minimal in nature, his stating "I do not know" to a customer looking for a product this was due to his being very busy with other tasks.  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  He continues to take his medications, feels they are effective, reports he had 1 incident when in the break room at work where there is typically more people in close quarters where he had higher anxiety and reported hearing voices that were critical in nature of him.  He stated that he recognizes this is associated with his history of symptoms, that this has not  occurred multiple times and plans to further discuss with his doctor.  He continues to express satisfaction with having improved energy levels, has lost weight in part due to the physical labor of his job.  He has also been able to excel receiving other certifications at work.  Facilitated his identifying feelings related where he feels a sense of contentment with his progress, plans to reach out to his prescribing provider if needed as this was also  encouraged.    Interventions:   Motivational interviewing, supportive therapy, CBT  Plan: Patient is to use CBT, mindfulness and coping skills to help decrease symptoms associated with their diagnosis.  Patient to continue to identify ways to cope and care for himself, attend doctor appointments about following through with his primary care physician as well as his psychiatry appointments.    Long-term goal:   Reduce overall level, frequency, and intensity of the feelings of anxiety in social situations for up to 3 months consecutively.   Decrease feelings of sadness and anxiety related to life stressors.   Short-term goal:  Patient to continue to work on identifying thoughts that make his anxiety increased in social situations and to continue to allow himself to engage in social situations to continue to try and work past his anxiety.   Patient to ground himself with rational, calming self talk when having intrusive thoughts. Increased feelings of self-worth by taking steps toward achieving more independence by finding and maintaining employment  Assessment of progress:  progressing   Waldron Session, [facility]

## 2024-01-28 DIAGNOSIS — F411 Generalized anxiety disorder: Secondary | ICD-10-CM | POA: Diagnosis not present

## 2024-01-28 DIAGNOSIS — F250 Schizoaffective disorder, bipolar type: Secondary | ICD-10-CM | POA: Diagnosis not present

## 2024-02-04 DIAGNOSIS — Z79899 Other long term (current) drug therapy: Secondary | ICD-10-CM | POA: Diagnosis not present

## 2024-02-04 DIAGNOSIS — F250 Schizoaffective disorder, bipolar type: Secondary | ICD-10-CM | POA: Diagnosis not present

## 2024-02-04 DIAGNOSIS — G4700 Insomnia, unspecified: Secondary | ICD-10-CM | POA: Diagnosis not present

## 2024-02-05 ENCOUNTER — Ambulatory Visit: Payer: BC Managed Care – PPO | Admitting: Mental Health

## 2024-02-05 DIAGNOSIS — F259 Schizoaffective disorder, unspecified: Secondary | ICD-10-CM | POA: Diagnosis not present

## 2024-02-05 DIAGNOSIS — G4700 Insomnia, unspecified: Secondary | ICD-10-CM | POA: Diagnosis not present

## 2024-02-05 NOTE — Progress Notes (Signed)
 [facility]  Name: Egon Dittus Date:  02/05/24 MRN: [id number] DOB: 05-06-1996 PCP: Noberto Retort, MD  Time spent: 30 minutes  Treatment:  individual therapy  Virtual Visit via Telehealth Note Connected with patient by a telemedicine/telehealth application, with their informed consent, and verified patient privacy and that I am speaking with the correct person using two identifiers. I discussed the limitations, risks, security and privacy concerns of performing psychotherapy and the availability of in person appointments. I also discussed with the patient that there may be a patient responsible charge related to this service. The patient expressed understanding and agreed to proceed. I discussed the treatment planning with the patient. The patient was provided an opportunity to ask questions and all were answered. The patient agreed with the plan and demonstrated an understanding of the instructions. The patient was advised to call  our office if  symptoms worsen or feel they are in a crisis state and need immediate contact.   Therapist Location: office Patient Location: home   Mental Status Exam:    Appearance:    Casual     Behavior:   Appropriate  Motor:   WNL  Speech/Language:    Clear and Coherent  Affect:   Full range   Mood:   Euthymic  Thought process:   normal  Thought content:     WNL  Sensory/Perceptual disturbances:     none  Orientation:   x4  Attention:   Good  Concentration:   Good  Memory:   Intact  Fund of knowledge:    Consistent with age and development  Insight:     Good  Judgment:    Good  Impulse Control:   Good   Reported Symptoms:  anxiety, depression, rumination, avoidant behavior (crowds at times)  Risk Assessment: Danger to Self:  No Self-injurious Behavior: No Danger to Others: No Duty to Warn:no Physical Aggression / Violence:No  Access to Firearms a concern: No  Gang Involvement:No  Patient / guardian was  educated about steps to take if suicide or homicide risk level increases between visits: yes While future psychiatric events cannot be accurately predicted, the patient does not currently require acute inpatient psychiatric care and does not currently meet [facility] involuntary commitment criteria.  Diagnoses:    ICD-10-CM   1. Schizoaffective disorder, unspecified type (HCC)  F25.9          Subjective:  Patient engaged in telehealth session via video.  Patient shared progress where he stated that he went to the hospital in [place], [place], [facility].  He stated that he went due to being unable to sleep for about 1 week.  He stated that he was at work yesterday, left early due to feeling fatigued.  At the hospital, he stated that he was prescribed Trazadone and was discharged from the ED.  He stated that he then went home and went to sleep for about 2 hours.  Currently, he is visiting a friend as he is off work today.  He stated that he feels better today, is taking his medication as prescribed and plans to take the trazodone as he communicated with his psychiatrist.  Encouraged him to reach out to his psychiatrist with any questions or concerns regarding the medication.  He continues to report some symptoms as discussed last session related to her anxiety when around others in close quarters such as at work in the break room.  He stated the symptoms have not increased in frequency  or severity.  He denies any plan or intent to harm himself or others, denies command hallucinations that he may hear when stressed or anxious. 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  Expressed other recent positive developments related to finances as well as ongoing family support.  Explored with patient ways to cope and care for himself, allowing himself enough time to get him this evening to allow for enough  rest, using his support system as needed.  He was able to share feelings, experiences and express hopefulness about getting more sleep consistently over the next few days and plans to contact his doctor or go back to the ED if needed.  Interventions:   Motivational interviewing, supportive therapy   Plan: Patient is to use CBT, mindfulness and coping skills to help decrease symptoms associated with their diagnosis.  Patient to continue to identify ways to cope and care for himself, attend doctor appointments about following through with his primary care physician as well as his psychiatry appointments.    Long-term goal:   Reduce overall level, frequency, and intensity of the feelings of anxiety in social situations for up to 3 months consecutively.   Decrease feelings of sadness and anxiety related to life stressors.   Short-term goal:  Patient to continue to work on identifying thoughts that make his anxiety increased in social situations and to continue to allow himself to engage in social situations to continue to try and work past his anxiety.   Patient to ground himself with rational, calming self talk when having intrusive thoughts. Increased feelings of self-worth by taking steps toward achieving more independence by finding and maintaining employment  Assessment of progress:  progressing   Waldron Session, [facility]

## 2024-02-06 DIAGNOSIS — F259 Schizoaffective disorder, unspecified: Secondary | ICD-10-CM | POA: Diagnosis not present

## 2024-02-06 DIAGNOSIS — Z91148 Patient's other noncompliance with medication regimen for other reason: Secondary | ICD-10-CM | POA: Diagnosis not present

## 2024-02-06 DIAGNOSIS — F411 Generalized anxiety disorder: Secondary | ICD-10-CM | POA: Diagnosis not present

## 2024-02-06 DIAGNOSIS — F250 Schizoaffective disorder, bipolar type: Secondary | ICD-10-CM | POA: Diagnosis not present

## 2024-02-06 DIAGNOSIS — Z1152 Encounter for screening for COVID-19: Secondary | ICD-10-CM | POA: Diagnosis not present

## 2024-02-06 DIAGNOSIS — R45851 Suicidal ideations: Secondary | ICD-10-CM | POA: Diagnosis not present

## 2024-02-06 DIAGNOSIS — I10 Essential (primary) hypertension: Secondary | ICD-10-CM | POA: Diagnosis not present

## 2024-02-06 DIAGNOSIS — Z79899 Other long term (current) drug therapy: Secondary | ICD-10-CM | POA: Diagnosis not present

## 2024-02-17 DIAGNOSIS — F250 Schizoaffective disorder, bipolar type: Secondary | ICD-10-CM | POA: Diagnosis not present

## 2024-02-17 DIAGNOSIS — F411 Generalized anxiety disorder: Secondary | ICD-10-CM | POA: Diagnosis not present

## 2024-02-18 ENCOUNTER — Ambulatory Visit: Payer: BC Managed Care – PPO | Admitting: Mental Health

## 2024-02-18 DIAGNOSIS — F259 Schizoaffective disorder, unspecified: Secondary | ICD-10-CM | POA: Diagnosis not present

## 2024-02-18 NOTE — Progress Notes (Addendum)
 [facility] Psychotherapy Note  Name: Scott Vargas Date:  02/18/24 MRN: [id number] DOB: December 31, 1995 PCP: Roselind Congo, MD  Time spent: 27 minutes  Treatment:  individual therapy  Virtual Visit via Telehealth Note Connected with patient by a telemedicine/telehealth application, with their informed consent, and verified patient privacy and that I am speaking with the correct person using two identifiers. I discussed the limitations, risks, security and privacy concerns of performing psychotherapy and the availability of in person appointments. I also discussed with the patient that there may be a patient responsible charge related to this service. The patient expressed understanding and agreed to proceed. I discussed the treatment planning with the patient. The patient was provided an opportunity to ask questions and all were answered. The patient agreed with the plan and demonstrated an understanding of the instructions. The patient was advised to call  our office if  symptoms worsen or feel they are in a crisis state and need immediate contact.   Therapist Location: office Patient Location: home   Mental Status Exam:    Appearance:    Casual     Behavior:   Appropriate  Motor:   WNL  Speech/Language:    Clear and Coherent  Affect:   Full range   Mood:   Euthymic  Thought process:   normal  Thought content:     WNL  Sensory/Perceptual disturbances:     none  Orientation:   x4  Attention:   Good  Concentration:   Good  Memory:   Intact  Fund of knowledge:    Consistent with age and development  Insight:     Good  Judgment:    Good  Impulse Control:   Good   Reported Symptoms:  anxiety, depression, rumination, avoidant behavior (crowds at times)  Risk Assessment: Danger to Self:  No Self-injurious Behavior: No Danger to Others: No Duty to Warn:no Physical Aggression / Violence:No  Access to Firearms a concern: No  Gang Involvement:No  Patient / guardian was  educated about steps to take if suicide or homicide risk level increases between visits: yes While future psychiatric events cannot be accurately predicted, the patient does not currently require acute inpatient psychiatric care and does not currently meet [facility]  involuntary commitment criteria.  Diagnoses:    ICD-10-CM   1. Schizoaffective disorder, unspecified type (HCC)  F25.9           Subjective:  Patient engaged in telehealth session via video.  Assessed progress.  He stated that he voluntarily checked himself into Psychiatric inpatient in [place]  last week for 6 days, he stated that he was discharged on 02/12/24.  He followed up with his psychiatrist following discharge and he has another appointment in 2 weeks.  He reports symptoms are now managed, however, prior to inpatient he was having all the symptoms he had about 2 years ago prior to his initial inpatient stay.  Currently, he reports having having fatigue and is often tired he feels as a result of the side effect of the medication currently prescribed. He reports that his psychiatrist discontinued Invega  3mg  and he is now on Zyprexa 150mg  and wellbutrin  daily.  He reports at times he has experienced some dissociation.  He reports significantly that he has had to quit his job.  He stated that his family has been supportive and is currently residing with his parents.  Provide support and encouragement for him to continue to follow up and focus on self-care at this time.  He  agreed to follow up with continue to utilize his parents as support, contact a psychiatrist as needed or go to the ER in the interim if symptoms worsen. Due to his feeling drowsy patient elected to discontinue the session early.  Interventions:   Motivational interviewing, supportive therapy   Plan: Patient is to use CBT, mindfulness and coping skills to help decrease symptoms associated with their diagnosis.  Patient to continue to identify ways to cope  and care for himself, attend doctor appointments about following through with his primary care physician as well as his psychiatry appointments.    Long-term goal:   Reduce overall level, frequency, and intensity of the feelings of anxiety in social situations for up to 3 months consecutively.   Decrease feelings of sadness and anxiety related to life stressors.   Short-term goal:  Patient to continue to work on identifying thoughts that make his anxiety increased in social situations and to continue to allow himself to engage in social situations to continue to try and work past his anxiety.   Patient to ground himself with rational, calming self talk when having intrusive thoughts. Increased feelings of self-worth by taking steps toward achieving more independence by finding and maintaining employment  Assessment of progress:  progressing   Avram Lenis, [facility]

## 2024-03-04 DIAGNOSIS — F250 Schizoaffective disorder, bipolar type: Secondary | ICD-10-CM | POA: Diagnosis not present

## 2024-03-04 DIAGNOSIS — F411 Generalized anxiety disorder: Secondary | ICD-10-CM | POA: Diagnosis not present

## 2024-03-10 ENCOUNTER — Ambulatory Visit: Admitting: Mental Health

## 2024-03-16 ENCOUNTER — Ambulatory Visit: Admitting: Mental Health

## 2024-03-16 DIAGNOSIS — F259 Schizoaffective disorder, unspecified: Secondary | ICD-10-CM | POA: Diagnosis not present

## 2024-03-16 NOTE — Progress Notes (Signed)
 [facility] Psychotherapy Note  Name: Scott Vargas Date:  03/16/24 MRN: [id number] DOB: 1996/01/24 PCP: Roselind Congo, MD  Time spent:  46 minutes  Treatment:  individual therapy  Virtual Visit via Telehealth Note Connected with patient by a telemedicine/telehealth application, with their informed consent, and verified patient privacy and that I am speaking with the correct person using two identifiers. I discussed the limitations, risks, security and privacy concerns of performing psychotherapy and the availability of in person appointments. I also discussed with the patient that there may be a patient responsible charge related to this service. The patient expressed understanding and agreed to proceed. I discussed the treatment planning with the patient. The patient was provided an opportunity to ask questions and all were answered. The patient agreed with the plan and demonstrated an understanding of the instructions. The patient was advised to call  our office if  symptoms worsen or feel they are in a crisis state and need immediate contact.   Therapist Location: office Patient Location: home   Mental Status Exam:    Appearance:    Casual     Behavior:   Appropriate  Motor:   WNL  Speech/Language:    Clear and Coherent  Affect:   Full range   Mood:   Euthymic, some sadness  Thought process:   normal  Thought content:     WNL  Sensory/Perceptual disturbances:     none  Orientation:   x4  Attention:   Good  Concentration:   Good  Memory:   Intact  Fund of knowledge:    Consistent with age and development  Insight:     Good  Judgment:    Good  Impulse Control:   Good   Reported Symptoms:  anxiety, depression, rumination, avoidant behavior (crowds at times)  Risk Assessment: Danger to Self:  No Self-injurious Behavior: No Danger to Others: No Duty to Warn:no Physical Aggression / Violence:No  Access to Firearms a concern: No  Gang Involvement:No  Patient /  guardian was educated about steps to take if suicide or homicide risk level increases between visits: yes While future psychiatric events cannot be accurately predicted, the patient does not currently require acute inpatient psychiatric care and does not currently meet [facility]  involuntary commitment criteria.  Diagnoses:  No diagnosis found.    Subjective:  Patient engaged in telehealth session via video.  Discussed progress.  Patient shared the continues to have some symptoms since getting out of the hospital about 1 month ago.  He stated that he may have some intrusive thoughts in terms, had experienced shared about 2 months ago.  Was related to church.  He stated that he was becoming religiously preoccupied and since that time did not return to the church, rather he goes to church with his brother which is more conservative which he feels is more appropriate.  He stated that he got into an argument recently with the young man, made some comments a lot about wanting to find him that he expressed insight into how he should have avoided making these comments.  He denies any plan or intent to harm himself or others; he continues to deny any access to weapons.  he continues to attend his psychiatric appointments, he states they are monthly.  He reports increased fatigue, sleeping more frequently, about 12 to 14 hours per night.  Recommended support and understanding as he processed feelings related to the challenges of coping with his mental health issues and the effect  it had on his life.  He is focusing on self-care, getting adequate rest, finding meaning through enjoyable activities such as playing video games, and focusing on his financial aspirations.  Provide support and encouragement for him to continue his self-care, attend all doctor appointments and reviewed utilizing his support system.   Interventions:   Motivational interviewing, supportive therapy   Plan: Patient is to use CBT,  mindfulness and coping skills to help decrease symptoms associated with their diagnosis.  Patient to continue to identify ways to cope and care for himself, attend doctor appointments about following through with his primary care physician as well as his psychiatry appointments.    Long-term goal:   Reduce overall level, frequency, and intensity of the feelings of anxiety in social situations for up to 3 months consecutively.   Decrease feelings of sadness and anxiety related to life stressors.   Short-term goal:  Patient to continue to work on identifying thoughts that make his anxiety increased in social situations and to continue to allow himself to engage in social situations to continue to try and work past his anxiety.   Patient to ground himself with rational, calming self talk when having intrusive thoughts. Increased feelings of self-worth by taking steps toward achieving more independence by finding and maintaining employment  Assessment of progress:  progressing   Avram Lenis, [facility]

## 2024-03-24 ENCOUNTER — Ambulatory Visit: Admitting: Mental Health

## 2024-03-24 DIAGNOSIS — F259 Schizoaffective disorder, unspecified: Secondary | ICD-10-CM | POA: Diagnosis not present

## 2024-03-24 NOTE — Progress Notes (Signed)
 [facility] Psychotherapy Note  Name: Scott Vargas Date:  03/24/24 MRN: [id number] DOB: [birth year] PCP: Roselind Congo, MD  Time spent:  45 minutes  Treatment:  individual therapy  Virtual Visit via Telehealth Note Connected with patient by a telemedicine/telehealth application, with their informed consent, and verified patient privacy and that I am speaking with the correct person using two identifiers. I discussed the limitations, risks, security and privacy concerns of performing psychotherapy and the availability of in person appointments. I also discussed with the patient that there may be a patient responsible charge related to this service. The patient expressed understanding and agreed to proceed. I discussed the treatment planning with the patient. The patient was provided an opportunity to ask questions and all were answered. The patient agreed with the plan and demonstrated an understanding of the instructions. The patient was advised to call  our office if  symptoms worsen or feel they are in a crisis state and need immediate contact.   Therapist Location: office Patient Location: home   Mental Status Exam:    Appearance:    Casual     Behavior:   Appropriate  Motor:   WNL  Speech/Language:    Clear and Coherent  Affect:   Full range   Mood:   Euthymic, some sadness  Thought process:   normal  Thought content:     WNL  Sensory/Perceptual disturbances:     none  Orientation:   x4  Attention:   Good  Concentration:   Good  Memory:   Intact  Fund of knowledge:    Consistent with age and development  Insight:     Good  Judgment:    Good  Impulse Control:   Good   Reported Symptoms:  anxiety, depression, rumination, avoidant behavior (crowds at times)  Risk Assessment: Danger to Self:  No Self-injurious Behavior: No Danger to Others: No Duty to Warn:no Physical Aggression / Violence:No  Access to Firearms a concern: No  Gang Involvement:No  Patient /  guardian was educated about steps to take if suicide or homicide risk level increases between visits: yes While future psychiatric events cannot be accurately predicted, the patient does not currently require acute inpatient psychiatric care and does not currently meet [facility]  involuntary commitment criteria.  Diagnoses:    ICD-10-CM   1. Schizoaffective disorder, unspecified type (HCC)  F25.9          Subjective:  Patient engaged in telehealth session via video.  Assessed progress where he shared how he continues to have ongoing fatigue.  He relates this to his medication.  He reports his medication has been effective in managing his psychotic symptoms.  He denies any SI, HI or delusional thought.  He stated that he recognizes the contrast, how he is posting on line and social media about 1 to 2 months ago more impulsively.  He reports it was when he was experiencing some symptoms.  He recognizes and has insight about his thinking at that time and how he does not plan to make any posts.  Provide support and understanding as he process feelings related to not having a job currently and recognizing how this might be helpful to his mood at some point.  He is considering going back to his old job but is unsure if he will be able to be rehired based on his quitting abruptly prior to his going inpatient.  Patient was encouraged to recognize giving himself time to ensure that he feels he  is able to work a part-time job.  He stated that he has tried to take steps to be more social, contacted a girl that he is done for the last couple of years where they plan to go on a date this Sunday.  He looks forward to this opportunity.  He identified some feelings of loneliness and has tried to chat with people online to make friends and find a potential partner.  He reports some stress and family relationships, he and his mother getting to an argument last night due to his letting the dog inside.  He stated that  he would like to have more independence at some point but recognizes the need to receive the support from his parents as he continues to reside at their home.    Interventions:   Motivational interviewing, supportive therapy   Plan: Patient is to use CBT, mindfulness and coping skills to help decrease symptoms associated with their diagnosis.  Patient to continue to identify ways to cope and care for himself, attend doctor appointments about following through with his primary care physician as well as his psychiatry appointments.    Long-term goal:   Reduce overall level, frequency, and intensity of the feelings of anxiety in social situations for up to 3 months consecutively.   Decrease feelings of sadness and anxiety related to life stressors.   Short-term goal:  Patient to continue to work on identifying thoughts that make his anxiety increased in social situations and to continue to allow himself to engage in social situations to continue to try and work past his anxiety.   Patient to ground himself with rational, calming self talk when having intrusive thoughts. Increased feelings of self-worth by taking steps toward achieving more independence by finding and maintaining employment  Assessment of progress:  progressing   Avram Lenis, [facility]

## 2024-04-07 ENCOUNTER — Ambulatory Visit: Admitting: Mental Health
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
## 2024-04-09 NOTE — ED Notes (Signed)
 BH Access was asked to assess pt who is a 28 yo male diagnosed with Schizoaffective disorder bipolar type. Pt was drinking 12 beers last night at his grandfathers house in [place] , and posted on Facebook that he was wanting to kill people. Grandfather saw it. Pt took it down and he posted again this morning. It was the pastor and Bible study leader at a church used to go to (I got this weird fixation on it, It was 3 years ago), I don't know why I get these hyperfixations on things. He had verbalized thoughts of harming/assaulting his mother and father also today. He was told by [facility] he would have to come to ED voluntarily or he would be involuntary. Pt was brought by his mother. He has been having these thoughts off and on for several years and he gets a pressure in his head. He reports agitation and the need for klonopin . At present he is voluntary however, he does meet IVC criteria. Recommend pt be further evaluated by psychiatry. Dr. Bari is in agreement.  Scott Vargas, MSW LCSW 04/09/2024 / 8:02 PM

## 2024-04-09 NOTE — ED Provider Notes (Signed)
 [facility]  ED Provider Note  Scott Vargas 28 y.o. male DOB: 24-May-1996 MRN: [id number] History   Chief Complaint  Patient presents with  . Homicidal    States he has been having thoughts of harming people feels angry denies SI   This patient has a history of suicidal homicidal thoughts.  He was in the hospital here about 4 years ago for similar.  Apparently today to Bible study he threatened his pastor in Bible study leader.  Patient has a history of psychosis.  His medical compliance has been variable.   History provided by:  Patient Language interpreter used: No   Homicidal Presenting symptoms: aggressive behavior and homicidal ideas   Presenting symptoms: no agitation   Patient accompanied by:  Family member Degree of incapacity (severity):  Moderate Onset quality:  Sudden Chronicity:  New Relieved by:  Nothing Ineffective treatments:  None tried Associated symptoms: no abdominal pain and no chest pain        Past Medical History:  Diagnosis Date  . Allergy history unknown   . Asthma (*)     Past Surgical History:  Procedure Laterality Date  . Wisdom tooth extraction      Social History   Substance and Sexual Activity  Alcohol Use Not Currently   Tobacco Use History[1] E-Cigarettes  . Vaping Use Never User   . Start Date    . Cartridges/Day    . Quit Date     Social History   Substance and Sexual Activity  Drug Use Not Currently         Allergies[2]  Home Medications   ALBUTEROL IN    Inhale 1 puff into the lungs as needed.   AMLODIPINE BESYLATE (NORVASC) 5 MG TABLET    Take one tablet (5 mg dose) by mouth daily.   BENZTROPINE (COGENTIN) 0.5 MG TABLET    Take one tablet (0.5 mg dose) by mouth 2 (two) times a day as needed for up to 30 days.   BUPROPION  HCL (WELLBUTRIN  XL) 300 MG 24 HR TABLET    Take one tablet (300 mg dose) by mouth every morning.   CLONAZEPAM  (KLONOPIN ) 0.5 MG TABLET    Take one tablet (0.5 mg dose)  by mouth 2 (two) times a day as needed.   HYDROXYZINE  PAMOATE (VISTARIL ) 50 MG CAPSULE    Take one capsule (50 mg dose) by mouth 3 (three) times a day as needed.   LAMOTRIGINE (LAMICTAL) 25 MG TABLET    Take one tablet (25 mg dose) by mouth 2 (two) times daily for 30 days.   MODAFINIL  (PROVIGIL ) 200 MG TABLET    1/2 table the morning for 1 week then 1 each morning   NICOTINE POLACRILEX (NICORETTE) 2 MG GUM    Take one each (2 mg dose) by mouth as needed for Smoking cessation for up to 30 days.   PALIPERIDONE  PALMITATE (INVEGA  SUSTENNA) 234 MG/1.5ML    INJECT 1.5ML INTRAMUSCULARLY EVERY 4 WEEKS   PRAZOSIN  HCL (MINIPRESS ) 2 MG CAPSULE    Take two capsules (4 mg dose) by mouth at bedtime for 30 days.   PROPRANOLOL  HCL (INDERAL ) 20 MG TABLET    Take one tablet (20 mg dose) by mouth 3 (three) times a day as needed.   RISPERIDONE  (RISPERDAL ) 1 MG TABLET    Take one tablet (1 mg dose) by mouth 3 (three) times a day as needed.   TIRZEPATIDE (MOUNJARO) 2.5 MG/0.5 ML SOAJ PEN INJECTION  Inject 0.5 mLs (2.5 mg dose) into the skin as needed.   TRAZODONE  (DESYREL ) 50 MG TABLET    Take one tablet (50 mg dose) by mouth at bedtime as needed for Sleep (for insomnia) for up to 30 days.    Primary Survey   Exposure     No visible trauma noted on back exam.      Review of Systems   Review of Systems  Constitutional:  Negative for chills and fever.  HENT:  Negative for dental problem.   Respiratory:  Negative for chest tightness.   Cardiovascular:  Negative for chest pain.  Gastrointestinal:  Negative for abdominal pain.  Genitourinary:  Negative for dysuria.  Musculoskeletal:  Negative for back pain.  Neurological:  Negative for dizziness.  Hematological:  Negative for adenopathy.  Psychiatric/Behavioral:  Positive for homicidal ideas. Negative for agitation.     Physical Exam   ED Triage Vitals [04/09/24 1403]  BP (!) 161/119  Heart Rate 112  Resp 18  SpO2 99 %  Temp 98.2 F (36.8 C)     Physical Exam  Nursing note and vitals reviewed. Constitutional: He appears well-developed and well-nourished.  HENT:  Head: Normocephalic and atraumatic.  Eyes: EOM are intact. Pupils are equal, round, and reactive to light.  Neck: Normal range of motion. Neck supple.  Cardiovascular: Normal rate and regular rhythm.  Pulmonary/Chest: Respiratory effort normal.  Abdominal: Soft. Bowel sounds are normal.  Musculoskeletal: Normal range of motion. No visible trauma noted on back exam.     Cervical back: Normal range of motion and neck supple.   Neurological: He is alert and oriented to person, place, and time.  Skin: Skin is warm. Skin is dry.  Psychiatric:  No agitation or anger during the interview.     ED Course   Lab results:   COMPREHENSIVE METABOLIC PANEL - Abnormal      Result Value   Na 137     Potassium 3.7     Cl 101     CO2 24     AGAP 12     Glucose 255 (*)    BUN 7     Creatinine 0.70 (*)    Ca 8.8     ALK PHOS 83     T Bili 0.3     Total Protein 6.9     Alb 4.5     GLOBULIN 2.4     ALBUMIN/GLOBULIN RATIO 1.9     BUN/CREAT RATIO 10.0 (*)    ALT 12     AST 13     eGFR 129     Comment: Normal GFR (glomerular filtration rate) > 60 mL/min/1.73 meters squared, < 60 may include impaired kidney function. Calculation based on the Chronic Kidney Disease Epidemiology Collaboration (CK-EPI)equation refit without adjustment for race.  SALICYLATE LEVEL - Abnormal   Salicylate <3.0 (*)   ACETAMINOPHEN LEVEL - Abnormal   Acetaminophen <5.0 (*)   URINE DRUGS OF ABUSE SCRN - Normal   Ur PH DOA Scr 5.6     Amphet Scr Negative     Barb Scr Negative     Benzo Scr Negative     Cannab Scr Negative     Cocaine Scr Negative     Opiates Scr Negative     Meth Scr Negative     Oxyco Scr Negative     Fentanyl Scr Negative     Buprenorphine Screen Negative     Narrative:    Please Note Detection Levels  Below:                            Amphetamines                     1000 ng/mL  Barbiturates                    200 ng/mL  Benzodiazepines                 200 ng/mL  Cannabinoids (Marijuana, THC)   50 ng/mL  Cocaine                         300 ng/mL  Opiates                         300 ng/mL  Methadone                       300 ng/mL  Oxycodone                       100 ng/mL  Fentanyl                          5 ng/mL  Buprenorphine                     5 ng/mL  This test is a screening test and results are only to be used for medical purposes.  If confirmation of positive results are needed, please order confirmation by GC/MS for each drug that needs confirmation.  Urine specimens are retained for 5 days.   ETHANOL - Normal   Ethanol <10     Comment: Blood Alcohol Level is for Medical Purposes Only.  CBC AND DIFFERENTIAL   WBC 9.8     RBC 5.22     HGB 14.3     HCT 42.5     MCV 81.4     MCH 27.4     MCHC 33.6     Plt Ct 295     RDW SD 42.7     MPV 11.0     NRBC% 0.0     Absolute NRBC Count 0.00     NEUTROPHIL % 75.9     LYMPHOCYTE % 15.3     MONOCYTE % 7.3     Eosinophil % 0.7     BASOPHIL % 0.5     IG% 0.3     ABSOLUTE NEUTROPHIL COUNT 7.44     ABSOLUTE LYMPHOCYTE COUNT 1.50     Absolute Monocyte Count 0.72     Absolute Eosinophil Count 0.07     Absolute Basophil Count 0.05     Absolute Immature Granulocyte Count 0.03    LIGHT BLUE TOP  LAVENDER TOP  MINT GREEN-TOP TUBE (PST GEL/LI HEP)  GOLD SST    Imaging: No data to display    ECG: ECG Results          ECG 12 lead (Final result)  Result time 04/09/24 17:26:01    Final result             Narrative:   Diagnosis Class Abnormal Acquisition Device D3K Systolic BP 156 Diastolic BP 90 Ventricular Rate 111 Atrial Rate 111 P-R Interval 132 QRS Duration 94 Q-T Interval 318 QTC Calculation(Bazett) 432 Calculated  P Axis 65 Calculated R Axis 60 Calculated T Axis 41  Diagnosis Sinus tachycardia Nonspecific T wave abnormality Abnormal ECG No previous  ECGs available Dane Katz ([id number]) on 04/09/2024 5:25:51 PM certifies that he/she has reviewed the ECG tracing and confirms the  independent interpretation is correct.                                                                                       Pre-Sedation Procedures  ED Course as of 04/09/24 2306  Deward BRAVO [facility] Documentation  Fri Apr 09, 2024  2305 CBC And Differential  2306 Urine Drug Screen Negative negative  2306 Glucose(!): 255 Mild hyperglycemia   Medical Decision Making The patient is currently medically stable, and appropriate for behavioral health evaluation. Presentation is more consistent with functional psychiatric disorder than secondary to organic pathology or delirium. Patient would benefit from psychiatric evaluation to lend expertise and help determine the best disposition and treatment plan.  To ensure ongoing care as patient awaits BH evaluation, the following measures have been taken: -All acute medical issues identified on ED evaluation have been addressed  -Behavioral Health ED holding orders have been placed (including CIWA/COWS orders as needed) -Home Medications will be addressed through medication reconciliation process -Dietary orders, including blood sugar control, have been placed.   Differential diagnosis includes bipolar disorder, psychosis, homicidal ideation. Social barriers to care include medical noncompliance.  Problems Addressed: Homicidal ideation: chronic illness or injury with exacerbation, progression, or side effects of treatment  Amount and/or Complexity of Data Reviewed External Data Reviewed: notes.    Details: Previous BH notes reviewed. Labs: ordered. Decision-making details documented in ED Course. ECG/medicine tests: ordered and independent interpretation performed. Decision-making details documented in ED Course. Discussion of management or test interpretation with  external provider(s): Patient seen by behavioral health and agreed that the patient is psychotic.  Patient will be admitted for further evaluation and treatment  Risk Prescription drug management. Decision regarding hospitalization.           Provider Communication  New Prescriptions   No medications on file    Modified Medications   No medications on file    Discontinued Medications   No medications on file    Clinical Impression Final diagnoses:  Homicidal ideation    ED Disposition     ED Disposition  Behavioral Health    Condition  --   Comment  Bed request comments: [facility] Anticipated Observation Location:: EOU                    Electronically signed by:   Deward BRAVO Bough, MD 04/09/24 2306      [1] Social History Tobacco Use  Smoking Status Never  Smokeless Tobacco Never  [2] No Known Allergies

## 2024-04-09 NOTE — ED Provider Notes (Signed)
 [facility]  ED INITIAL NOTE FOR EXTENDED STAY  Date & Time of assessment: 04/09/2024    5:27 PM  Observation Time: 04/09/24 1408  History and Physical:  I have reviewed previous ED H&P and there are no changes.  Family History     No family history on file.      Additional Family history: None.  Placed in Obs for:  [facility] evaluation, treatment and disposition with medical management  Medical co-morbidities include HTN  Treatment Plan:  Psychiatric and medical care, while awaiting disposition decision and implementation    Final diagnoses:  Homicidal ideation     Electronically signed by:   Sharlet VEAR Salles, NP 04/09/24 1727

## 2024-04-09 NOTE — ED Notes (Signed)
 Graham crackers, pudding, and diet coke given per patient's request.

## 2024-04-09 NOTE — ED Notes (Signed)
 1 pink bag from ED

## 2024-04-10 NOTE — ED Notes (Signed)
 Patient alert and oriented x 4. Patient has been calm and cooperative, medication compliant. Patient denies SI, VH, but endorsed HI towards two persons and stated he posted on Facebook, stating that, this is so stupid of me. Patient shares, I have been hearing multiple voices since I was young, but now, I feel pressure in my head. Patient presents with anxiety and claustrophobia, and asked staff to leave his door open. No aggressive behavior noted. Respirations unlabored. No acute distress noted.

## 2024-04-10 NOTE — ED Notes (Signed)
 When I asked patient is he has thoughts of hurting other people he smiled but denied wanting to hurt others.

## 2024-04-10 NOTE — Consults (Signed)
 [facility] Psych[facility] - Telepsychiatry Consultation Visit was conducted with the use of interactive audio and video telecommunications systems that permits real time communication between provider and patient  Patient Location: Patient location at time of Tele-Consult: [facility] Provider Location: Provider Location at time of Tele-Consult: Home Date of Service: 04/10/2024 Referral Source: Emergency Department Record Review: moderate Assessment   Psychiatric Diagnoses: Principal Problem:   Schizoaffective disorder, depressive type (*) Active Problems:   Homicidal ideation   Mild alcohol use disorder   Formulation and MDM: Patient is a 28 year old male with history of schizoaffective disorder and alcohol use disorder who presented to the hospital via police after posting homicidal threats on Facebook.  Patient was at his grandfather's [employer] when the events occurred.  Grandfather sent him back to [place]  for police picking him up and bring him to the hospital.  Patient admits to increased feelings of paranoia, intense and hyperfocused thoughts on hurting other people, and increased auditory hallucinations.  He has been compliant with psychiatric medications.  He last received his Invega  Sustenna injection on Monday which he receives every 4 weeks.  From his perspective, his hallucinations have been more intense this past week and he has had a harder time shaking his intermittent homicidal ideation.  At this time, the patient does not present with a decompensation of his underlying schizoaffective disorder.  Will restart home medications and seek psychiatric hospitalization for safety management this time.  The patient has been evaluated and determined to be medically stable by the ED provider.  Patient has been assessed by the ED [facility] Therapist and the findings have been discussed with this provider.  Psychiatry was consulted to  assist with psychiatric assessment and treatment/disposition planning.  The chart has been reviewed and pertinent findings are noted below. Based on this review and assessment, the treatment plan has been created and discussed with the treatment team.   Based on my assessment, patient requires psychiatric hospitalization due to risk of injury to others and severely impaired judgment, severe confusion, psychosis.  Treatment Plan & Recommendation   - Disposition:  - Accepted for psych admission to a [facility] unit once a bed is available - Commitment Status: Involuntary - Precautions:  - elopement - Pertinent Labs:  - Reviewed - Psych Med Recs:  - Invega  Sustenna 234 mg - last dose on 04/05/24  - Wellbutrin  XL 300 mg po daily  - Klonopin  0.5 mg po BID prn anxiety - Medical Recs:  - Defer to ED team  Chief Complaint   HI  History of Present Illness   Patient is a 28 year old male with history of schizoaffective disorder and alcohol use disorder who presented to the hospital via police after posting homicidal threats on Facebook.  Patient was at his grandfather's [employer] when the events occurred.  Grandfather sent him back to [place]  for police picking him up and bring him to the hospital.  Patient admits to increased feelings of paranoia, intense and hyperfocused thoughts on hurting other people, and increased auditory hallucinations.  He has been compliant with psychiatric medications.  He last received his Invega  Sustenna injection on Monday which he receives every 4 weeks.  From his perspective, his hallucinations have been more intense this past week and he has had a harder time shaking his intermittent homicidal ideation.  At this time, the patient does not present with a decompensation of his underlying schizoaffective disorder.  Will restart home  medications and seek psychiatric hospitalization for safety management this time.  Current  suicidal/homicidal ideations: Endorses HI Current auditory/visual hallucinations: Endorses AH  Review Of Systems: A complete review of systems of the following systems was conducted (Constitutional, Psychiatric, Neurological, Musculoskeletal, Eyes, Gastrointestinal, Cardiovascular, Respiratory, Skin, and Endocrine). All reviewed systems are negative except pertinent positives identified in the HPI.  Past Psychiatric History   Previous diagnoses: SA disorder Previous psychiatric medication trials: Invega ; Rispredal; Zyprexa Past suicidal/homicidal ideation/attempt: Yes Current/Past psychiatric provider: Davene GLAD Previous psychiatric hospitalizations/Rehab: Multiple  Past Medical History   Past Medical History:  Diagnosis Date  . Allergy history unknown   . Asthma (*)     Substance Use History   Marijuana: Denies Cocaine: Denies Opiates: Denies Stimulants: Denies Benzodiazepine: Denies Tobacco:  Denies Alcohol: Endorses 1-2x/week Other illicit drug usage: Denies  Patient denies all other substance use except for what is listed above.  Readiness for substance/alcohol abuse treatment, if applicable: No  Family History   Family history of suicide? No  No family history on file.  Social History   Access to firearms: no  Social History   Socioeconomic History  . Marital status: Single  Tobacco Use  . Smoking status: Never  . Smokeless tobacco: Never  Vaping Use  . Vaping status: Never Used  Substance and Sexual Activity  . Alcohol use: Not Currently  . Drug use: Not Currently    Evaluation   Vitals:  Vitals:   04/10/24 0859  BP: (!) 154/95  Pulse:   Resp:   Temp:   SpO2:     Medications: . amLODIPine besylate  5 mg Oral Daily  . buPROPion  hcl  300 mg Oral q AM  . dextromethorphan-guaiFENesin  1 tablet Oral 2 times per day  . doxazosin mesylate  4 mg Oral Daily  . lamoTRIgine  25 mg Oral BID  . [START ON 05/03/2024] paliperidone  palmitate  234 mg  Intramuscular Q4 WEEKS   acetaminophen, clonazePAM , diphenhydrAMINE **OR** diphenhydrAMINE, haloperidol **OR** haloperidol lactate, hydrOXYzine  HCl, ibuprofen, LORAzepam, nicotine polacrilex, sennosides-docusate sodium, sodium chloride, traZODone  Allergies: Allergies[1] Labs: Recent Results (from the past 24 hours)  CBC And Differential   Collection Time: 04/09/24  3:17 PM  Result Value Ref Range   WBC 9.8 4.0 - 10.5 thou/mcL   RBC 5.22 4.63 - 6.08 million/mcL   HGB 14.3 13.7 - 17.5 gm/dL   HCT 57.4 59.8 - 48.9 %   MCV 81.4 79.0 - 92.2 fL   MCH 27.4 25.7 - 32.2 pg   MCHC 33.6 32.3 - 36.5 gm/dL   Plt Ct 704 849 - 599 thou/mcL   RDW SD 42.7 35.1 - 46.3 fL   MPV 11.0 9.4 - 12.4 fL   NRBC% 0.0 0.0 - 0.2 /100WBC   Absolute NRBC Count 0.00 0.00 - 0.01 thou/mcL   NEUTROPHIL % 75.9 %   LYMPHOCYTE % 15.3 %   MONOCYTE % 7.3 %   Eosinophil % 0.7 %   BASOPHIL % 0.5 %   IG% 0.3 %   ABSOLUTE NEUTROPHIL COUNT 7.44 1.50 - 7.50 thou/mcL   ABSOLUTE LYMPHOCYTE COUNT 1.50 1.00 - 4.50 thou/mcL   Absolute Monocyte Count 0.72 0.10 - 0.80 thou/mcL   Absolute Eosinophil Count 0.07 0.00 - 0.50 thou/mcL   Absolute Basophil Count 0.05 0.00 - 0.20 thou/mcL   Absolute Immature Granulocyte Count 0.03 0.00 - 0.03 thou/mcL   Comprehensive Metabolic Panel   Collection Time: 04/09/24  3:17 PM  Result Value Ref Range   Na 137  136 - 146 mmol/L   Potassium 3.7 3.7 - 5.4 mmol/L   Cl 101 97 - 108 mmol/L   CO2 24 20 - 32 mmol/L   AGAP 12 7 - 16 mmol/L   Glucose 255 (H) 65 - 99 mg/dL   BUN 7 6 - 20 mg/dL   Creatinine 9.29 (L) 9.23 - 1.27 mg/dL   Ca 8.8 8.7 - 89.7 mg/dL   ALK PHOS 83 25 - 849 U/L   T Bili 0.3 0.0 - 1.2 mg/dL   Total Protein 6.9 6.0 - 8.5 gm/dL   Alb 4.5 3.5 - 5.5 gm/dL   GLOBULIN 2.4 1.5 - 4.5 gm/dL   ALBUMIN/GLOBULIN RATIO 1.9 1.1 - 2.5   BUN/CREAT RATIO 10.0 (L) 11.0 - 26.0   ALT 12 0 - 55 U/L   AST 13 0 - 40 U/L   eGFR 129 >=60 mL/min/1.55m2  Ethanol   Collection Time: 04/09/24   3:17 PM  Result Value Ref Range   Ethanol <10 0 mg/dL  Aspirin (ASA) Level   Collection Time: 04/09/24  3:17 PM  Result Value Ref Range   Salicylate <3.0 (L) 30.0 - 250.0 mcg/mL  Acetaminophen (APAP) Level   Collection Time: 04/09/24  3:17 PM  Result Value Ref Range   Acetaminophen <5.0 (L) 10.0 - 25.0 mcg/mL  Urine Drug Screen   Collection Time: 04/09/24  3:39 PM  Result Value Ref Range   Ur PH DOA Scr 5.6 4.5 - 9.0   Amphet Scr Negative Negative   Barb Scr Negative Negative   Benzo Scr Negative Negative   Cannab Scr Negative Negative   Cocaine Scr Negative Negative   Opiates Scr Negative Negative   Meth Scr Negative Negative   Oxyco Scr Negative Negative   Fentanyl Scr Negative Negative   Buprenorphine Screen Negative Negative    Mental Status Evaluation   Constitutional:   General Appearance Disheveled  General Behavior Guarded   Musculoskeletal:   Gait and Station In bed for duration of assessment   Strength and tone UTA due to tele-assessment modality   Psychiatric:   Psychomotor Activity No motor abnormalities  Speech  Latency  Mood  Suspicious   Affect  Blunted  Thought Process Concrete  Associations Intact association  Thought Content/Perceptual Disturbances Evidence of:  Homicidal ideation  Cognition/Sensorium  AAOx4; Memory, attention, language, and fund of knowledge intact   Insight  Poor  Judgment Poor      Electronically signed by: Carliss JAYSON Cancer, DO 04/10/2024 9:35 AM         [1] No Known Allergies

## 2024-04-10 NOTE — ED Provider Notes (Signed)
 [facility]  ED EXTENDED STAY PROGRESS NOTE   Date & Time of assessment: 04/10/2024  7:42 AM  Current Vital Signs: Vitals:   04/10/24 0438  BP: 122/79  Pulse: 92  Resp: 18  Temp: 97.7 F (36.5 C)  SpO2: 97%    ED Medications: Medications  haloperidol (HALDOL) tablet 5 mg (has no administration in time range)    Or  haloperidol lactate (HALDOL) injection 5 mg (has no administration in time range)  diphenhydrAMINE (BANOPHEN,BENADRYL) capsule 50 mg (has no administration in time range)    Or  diphenhydrAMINE (BENADRYL) injection 50 mg (has no administration in time range)  traZODone  (DESYREL ) tablet 50 mg (50 mg Oral Given 04/09/24 2026)  acetaminophen (TYLENOL) tablet 650 mg (has no administration in time range)  ibuprofen (ADVIL,MOTRIN) tablet 600 mg (has no administration in time range)  sennosides-docusate sodium (SENOKOT-S) 8.6-50 mg per tablet 1 tablet (has no administration in time range)  amLODIPine besylate (NORVASC) tablet 5 mg (5 mg Oral Given 04/09/24 1811)  lamoTRIgine (LAMICTAL) tablet 25 mg (25 mg Oral Given 04/09/24 2026)  nicotine polacrilex (NICORETTE) gum 2 mg (has no administration in time range)  doxazosin mesylate (CARDURA) tablet 4 mg (4 mg Oral Given 04/09/24 1811)  LORazepam (ATIVAN) tablet 1 mg (1 mg Oral Given 04/09/24 1846)  hydrOXYzine  HCl (ATARAX ) tablet 50 mg (50 mg Oral Given 04/09/24 2330)  dextromethorphan-guaiFENesin (MUCINEX DM) 30-600 mg 12 hr tablet (has no administration in time range)  sodium chloride (ALTAMIST,OCEAN,DEEP SEA NASAL SPRAY) 0.65% nasal solution 1 spray (has no administration in time range)    Interval Assessment:   Nasal congestion  Patient sleeping upon entering room.  He reports he has nasal congestion overnight has history of recurrent sinusitis and reports some allergies.   Reports taking pseudoephedrine at home.  Denies any other complaints.  Eating and drinking normally.   Plan: Awaiting  psychiatric team reassessment.  Appreciate recommendations. Nasal congestion- COVID flu RSV ordered Mucinex ordered and saline nasal spray.  Will continue to monitor.  Final diagnoses:  Homicidal ideation     Electronically signed by:   Tinnie Kitty, PA 04/10/24 [phone]

## 2024-04-10 NOTE — ED Notes (Signed)
 Disposition  ED Provider Contact Name: Patient accepted for IP admission by Dr. Drusilla MD Contact Date: 04/10/24 MD Contact Time: 9068 Disposition Recommendation: Inpatient Admission Admission Type: Voluntary Status-Meets IVC Criteria Inpatient Attending Psychiatrist Name: [phone] Accepting Psychiatrist Name: Dr. Drusilla Delegated APP Name: N/A Admitted to Medical Unit?: No Disposition Comments: Patient has been referred to [facility] for inpatient hospitalization   Electronically signed: Corean Kato, MSW LCSW 04/10/2024 / 9:45 AM

## 2024-04-10 NOTE — ED Notes (Signed)
 Patient reports anxiety 6/10.

## 2024-04-22 ENCOUNTER — Ambulatory Visit (INDEPENDENT_AMBULATORY_CARE_PROVIDER_SITE_OTHER): Payer: MEDICAID | Admitting: Mental Health

## 2024-04-22 DIAGNOSIS — F259 Schizoaffective disorder, unspecified: Secondary | ICD-10-CM | POA: Diagnosis not present

## 2024-04-22 NOTE — Progress Notes (Addendum)
 [facility] Psychotherapy Note  Name: Scott Vargas Date:  03/24/24 MRN: [id number] DOB: 05/25/1996 PCP: Arloa Elsie SAUNDERS, MD  Time spent:  45 minutes  Treatment:  individual therapy  Virtual Visit via Telehealth Note Connected with patient by a telemedicine/telehealth application, with their informed consent, and verified patient privacy and that I am speaking with the correct person using two identifiers. I discussed the limitations, risks, security and privacy concerns of performing psychotherapy and the availability of in person appointments. I also discussed with the patient that there may be a patient responsible charge related to this service. The patient expressed understanding and agreed to proceed. I discussed the treatment planning with the patient. The patient was provided an opportunity to ask questions and all were answered. The patient agreed with the plan and demonstrated an understanding of the instructions. The patient was advised to call  our office if  symptoms worsen or feel they are in a crisis state and need immediate contact.   Therapist Location: office Patient Location: home   Mental Status Exam:    Appearance:    Casual     Behavior:   Appropriate  Motor:   WNL  Speech/Language:    Clear and Coherent  Affect:   Full range   Mood:   Euthymic, some sadness  Thought process:   normal  Thought content:     WNL  Sensory/Perceptual disturbances:     none  Orientation:   x4  Attention:   Good  Concentration:   Good  Memory:   Intact  Fund of knowledge:    Consistent with age and development  Insight:     Good  Judgment:    Good  Impulse Control:   Good   Reported Symptoms:  anxiety, depression, rumination, avoidant behavior (crowds at times)  Risk Assessment: Danger to Self:  No Self-injurious Behavior: No Danger to Others: Thoughts of harming others but denies any plan or intent to harm others or follow through meeting. Duty to  Warn:no Physical Aggression / Violence:No  Access to Firearms a concern: No  Gang Involvement:No  Patient / guardian was educated about steps to take if suicide or homicide risk level increases between visits: yes While future psychiatric events cannot be accurately predicted, the patient does not currently require acute inpatient psychiatric care and does not currently meet [place]  involuntary commitment criteria.  Diagnoses:    ICD-10-CM   1. Schizoaffective disorder, unspecified type (HCC)  F25.9           Subjective:  Patient engaged in telehealth session via video.  Assessed progress per patient shared that he had gone to marijuana ED and was there from June 13 to June 15.  He stated that he began to have more psychotic symptoms.  He reported increased anger, had made some threats to others online.  He stated that the sheriff's office intervene and he was taken to the hospital and voluntarily admitted.  He stated that he has had ongoing challenges with sexual thoughts and violent thoughts.  He denies any plan or intent to harm anyone, recognizes these thoughts are associated with his mental health symptoms and is hopeful that his medication will start to work more effectively.  He stated that he received an injection of Invega  June 9, his next injection is July 9 with his psychiatrist at [facility].  He stated that when he had made the threats to others on line, he had also consumed about 12 beers that day.  He was receptive to discontinuing alcohol use as he acknowledged it may have played a role in his decision making.  Facilitated his identifying warning signs when he starts to feel emotionally distressed and explored coping.  He stated that he is closest with his father and plans to utilize him ongoing as a support system as well as his close friend that he talks to frequently and is aware of his mental health challenges.  Invited him to contact our office between visits if  needed, he agreed as well as to contact his psychiatrist's office as well and/or go to the ED if needed.   Interventions:   Motivational interviewing, supportive therapy   Plan: Patient is to use CBT, mindfulness and coping skills to help decrease symptoms associated with their diagnosis.  Patient to continue to identify ways to cope and care for himself, attend doctor appointments about following through with his primary care physician as well as his psychiatry appointments.    Long-term goal:   Reduce overall level, frequency, and intensity of the feelings of anxiety in social situations for up to 3 months consecutively.   Decrease feelings of sadness and anxiety related to life stressors.   Short-term goal:  Patient to continue to work on identifying thoughts that make his anxiety increased in social situations and to continue to allow himself to engage in social situations to continue to try and work past his anxiety.   Patient to ground himself with rational, calming self talk when having intrusive thoughts. Increased feelings of self-worth by taking steps toward achieving more independence by finding and maintaining employment  Assessment of progress:  progressing   Lonni Fischer, [facility]

## 2024-05-07 ENCOUNTER — Ambulatory Visit (HOSPITAL_COMMUNITY)
Admission: EM | Admit: 2024-05-07 | Discharge: 2024-05-07 | Disposition: A | Discharge status: Other Acute Inpatient Hospital | Payer: MEDICAID | Source: Ambulatory Visit | Attending: Behavioral Health | Admitting: Behavioral Health

## 2024-05-07 ENCOUNTER — Inpatient Hospital Stay (HOSPITAL_COMMUNITY)
Admission: AD | Admit: 2024-05-07 | Discharge: 2024-05-16 | DRG: 885 | Disposition: A | Discharge status: Home / Self Care | Payer: MEDICAID | Source: Intra-hospital | Attending: Psychiatry | Admitting: Psychiatry

## 2024-05-07 DIAGNOSIS — E119 Type 2 diabetes mellitus without complications: Secondary | ICD-10-CM | POA: Diagnosis present

## 2024-05-07 DIAGNOSIS — Z7985 Long-term (current) use of injectable non-insulin antidiabetic drugs: Secondary | ICD-10-CM | POA: Insufficient documentation

## 2024-05-07 DIAGNOSIS — F411 Generalized anxiety disorder: Secondary | ICD-10-CM | POA: Diagnosis present

## 2024-05-07 DIAGNOSIS — Z79899 Other long term (current) drug therapy: Secondary | ICD-10-CM | POA: Insufficient documentation

## 2024-05-07 DIAGNOSIS — F1010 Alcohol abuse, uncomplicated: Secondary | ICD-10-CM | POA: Diagnosis present

## 2024-05-07 DIAGNOSIS — Z7984 Long term (current) use of oral hypoglycemic drugs: Secondary | ICD-10-CM

## 2024-05-07 DIAGNOSIS — I10 Essential (primary) hypertension: Secondary | ICD-10-CM | POA: Diagnosis present

## 2024-05-07 DIAGNOSIS — R45850 Homicidal ideations: Secondary | ICD-10-CM | POA: Diagnosis present

## 2024-05-07 DIAGNOSIS — F32A Depression, unspecified: Secondary | ICD-10-CM | POA: Diagnosis present

## 2024-05-07 DIAGNOSIS — F200 Paranoid schizophrenia: Principal | ICD-10-CM | POA: Diagnosis present

## 2024-05-07 DIAGNOSIS — F259 Schizoaffective disorder, unspecified: Secondary | ICD-10-CM | POA: Insufficient documentation

## 2024-05-07 DIAGNOSIS — Z560 Unemployment, unspecified: Secondary | ICD-10-CM | POA: Diagnosis not present

## 2024-05-07 DIAGNOSIS — Z5986 Financial insecurity: Secondary | ICD-10-CM | POA: Diagnosis not present

## 2024-05-07 DIAGNOSIS — F1290 Cannabis use, unspecified, uncomplicated: Secondary | ICD-10-CM | POA: Diagnosis not present

## 2024-05-07 DIAGNOSIS — F17290 Nicotine dependence, other tobacco product, uncomplicated: Secondary | ICD-10-CM | POA: Diagnosis present

## 2024-05-07 DIAGNOSIS — F600 Paranoid personality disorder: Secondary | ICD-10-CM | POA: Diagnosis present

## 2024-05-07 DIAGNOSIS — F250 Schizoaffective disorder, bipolar type: Secondary | ICD-10-CM | POA: Diagnosis not present

## 2024-05-07 HISTORY — DX: Type 2 diabetes mellitus without complications: E11.9

## 2024-05-07 LAB — COMPREHENSIVE METABOLIC PANEL WITH GFR
ALT: 24 U/L (ref 0–44)
AST: 19 U/L (ref 15–41)
Albumin: 4.4 g/dL (ref 3.5–5.0)
Alkaline Phosphatase: 66 U/L (ref 38–126)
Anion gap: 13 (ref 5–15)
BUN: 10 mg/dL (ref 6–20)
CO2: 26 mmol/L (ref 22–32)
Calcium: 9.7 mg/dL (ref 8.9–10.3)
Chloride: 101 mmol/L (ref 98–111)
Creatinine, Ser: 0.81 mg/dL (ref 0.61–1.24)
GFR, Estimated: >60 mL/min (ref >60)
Glucose, Bld: 89 mg/dL (ref 70–99)
Potassium: 4 mmol/L (ref 3.5–5.1)
Sodium: 140 mmol/L (ref 135–145)
Total Bilirubin: 0.8 mg/dL (ref 0.0–1.2)
Total Protein: 8.1 g/dL (ref 6.5–8.1)

## 2024-05-07 LAB — POCT URINE DRUG SCREEN - MANUAL ENTRY (I-SCREEN)
POC Amphetamine UR: NOT DETECTED
POC Buprenorphine (BUP): NOT DETECTED
POC Cocaine UR: NOT DETECTED
POC Marijuana UR: NOT DETECTED
POC Methadone UR: NOT DETECTED
POC Methamphetamine UR: NOT DETECTED
POC Morphine: NOT DETECTED
POC Oxazepam (BZO): NOT DETECTED
POC Oxycodone UR: NOT DETECTED
POC Secobarbital (BAR): NOT DETECTED

## 2024-05-07 LAB — CBC WITH DIFFERENTIAL/PLATELET
Abs Immature Granulocytes: 0.05 K/uL (ref 0.00–0.07)
Basophils Absolute: 0.1 K/uL (ref 0.0–0.1)
Basophils Relative: 1 %
Eosinophils Absolute: 0.2 K/uL (ref 0.0–0.5)
Eosinophils Relative: 2 %
HCT: 45.7 % (ref 39.0–52.0)
Hemoglobin: 15 g/dL (ref 13.0–17.0)
Immature Granulocytes: 1 %
Lymphocytes Relative: 20 %
Lymphs Abs: 1.4 K/uL (ref 0.7–4.0)
MCH: 27.4 pg (ref 26.0–34.0)
MCHC: 32.8 g/dL (ref 30.0–36.0)
MCV: 83.5 fL (ref 80.0–100.0)
Monocytes Absolute: 0.6 K/uL (ref 0.1–1.0)
Monocytes Relative: 8 %
Neutro Abs: 5.1 K/uL (ref 1.7–7.7)
Neutrophils Relative %: 68 %
Platelets: 324 K/uL (ref 150–400)
RBC: 5.47 MIL/uL (ref 4.22–5.81)
RDW: 13.5 % (ref 11.5–15.5)
WBC: 7.4 K/uL (ref 4.0–10.5)
nRBC: 0 % (ref 0.0–0.2)

## 2024-05-07 LAB — LIPID PANEL
Cholesterol: 216 mg/dL — ABNORMAL HIGH (ref 0–200)
HDL: 49 mg/dL (ref >40)
LDL Cholesterol: 153 mg/dL — ABNORMAL HIGH (ref 0–99)
Total CHOL/HDL Ratio: 4.4 ratio
Triglycerides: 71 mg/dL (ref <150)
VLDL: 14 mg/dL (ref 0–40)

## 2024-05-07 LAB — HIV ANTIBODY (ROUTINE TESTING W REFLEX): HIV Screen 4th Generation wRfx: NONREACTIVE

## 2024-05-07 LAB — TSH: TSH: 1.883 u[IU]/mL (ref 0.350–4.500)

## 2024-05-07 LAB — ETHANOL: Alcohol, Ethyl (B): <15 mg/dL (ref <15)

## 2024-05-07 MED ORDER — BUPROPION HCL ER (XL) 300 MG PO TB24
300.0000 mg | ORAL_TABLET | Freq: Every morning | ORAL | Status: DC
Start: 2024-05-08 — End: 2024-05-08
  Administered 2024-05-08: 300 mg via ORAL
  Filled 2024-05-07: qty 1

## 2024-05-07 MED ORDER — HYDROXYZINE HCL 25 MG PO TABS
25.0000 mg | ORAL_TABLET | Freq: Three times a day (TID) | ORAL | Status: DC | PRN
  Administered 2024-05-07 – 2024-05-15 (×9): 25 mg via ORAL
  Filled 2024-05-07 (×8): qty 1

## 2024-05-07 MED ORDER — DIPHENHYDRAMINE HCL 50 MG PO CAPS: 50.0000 mg | ORAL_CAPSULE | Freq: Three times a day (TID) | ORAL | Status: DC | PRN

## 2024-05-07 MED ORDER — DIPHENHYDRAMINE HCL 50 MG/ML IJ SOLN: 50.0000 mg | Freq: Three times a day (TID) | INTRAMUSCULAR | Status: DC | PRN

## 2024-05-07 MED ORDER — PALIPERIDONE ER 3 MG PO TB24
3.0000 mg | ORAL_TABLET | Freq: Every day | ORAL | Status: DC
  Administered 2024-05-07: 3 mg via ORAL
  Filled 2024-05-07: qty 1

## 2024-05-07 MED ORDER — HALOPERIDOL LACTATE 5 MG/ML IJ SOLN: 5.0000 mg | Freq: Three times a day (TID) | INTRAMUSCULAR | Status: DC | PRN

## 2024-05-07 MED ORDER — LAMOTRIGINE ER 50 MG PO TB24
50.0000 mg | ORAL_TABLET | Freq: Every day | ORAL | Status: DC
  Filled 2024-05-07 (×2): qty 1

## 2024-05-07 MED ORDER — ACETAMINOPHEN 325 MG PO TABS
650.0000 mg | ORAL_TABLET | Freq: Four times a day (QID) | ORAL | Status: DC | PRN
  Administered 2024-05-07: 650 mg via ORAL
  Filled 2024-05-07: qty 2

## 2024-05-07 MED ORDER — METFORMIN HCL 500 MG PO TABS
500.0000 mg | ORAL_TABLET | Freq: Every day | ORAL | Status: DC
  Administered 2024-05-08 – 2024-05-16 (×9): 500 mg via ORAL
  Filled 2024-05-07 (×9): qty 1

## 2024-05-07 MED ORDER — TRAZODONE HCL 50 MG PO TABS: 50.0000 mg | ORAL_TABLET | Freq: Every evening | ORAL | Status: DC | PRN

## 2024-05-07 MED ORDER — METFORMIN HCL 500 MG PO TABS: 500.0000 mg | ORAL_TABLET | Freq: Every day | ORAL | Status: DC

## 2024-05-07 MED ORDER — LORAZEPAM 2 MG/ML IJ SOLN: 2.0000 mg | Freq: Three times a day (TID) | INTRAMUSCULAR | Status: DC | PRN

## 2024-05-07 MED ORDER — ACETAMINOPHEN 325 MG PO TABS
650.0000 mg | ORAL_TABLET | Freq: Four times a day (QID) | ORAL | Status: DC | PRN
  Administered 2024-05-15 (×2): 650 mg via ORAL
  Filled 2024-05-07 (×2): qty 2

## 2024-05-07 MED ORDER — DIPHENHYDRAMINE HCL 25 MG PO CAPS: 50.0000 mg | ORAL_CAPSULE | Freq: Three times a day (TID) | ORAL | Status: DC | PRN

## 2024-05-07 MED ORDER — HALOPERIDOL LACTATE 5 MG/ML IJ SOLN: 10.0000 mg | Freq: Three times a day (TID) | INTRAMUSCULAR | Status: DC | PRN

## 2024-05-07 MED ORDER — TRAZODONE HCL 50 MG PO TABS
50.0000 mg | ORAL_TABLET | Freq: Every evening | ORAL | Status: DC | PRN
  Administered 2024-05-07 – 2024-05-15 (×5): 50 mg via ORAL
  Filled 2024-05-07 (×5): qty 1

## 2024-05-07 MED ORDER — HALOPERIDOL 5 MG PO TABS: 5.0000 mg | ORAL_TABLET | Freq: Three times a day (TID) | ORAL | Status: DC | PRN

## 2024-05-07 MED ORDER — AMLODIPINE BESYLATE 5 MG PO TABS
5.0000 mg | ORAL_TABLET | Freq: Every day | ORAL | Status: DC
  Administered 2024-05-07: 5 mg via ORAL
  Filled 2024-05-07: qty 1

## 2024-05-07 MED ORDER — MAGNESIUM HYDROXIDE 400 MG/5ML PO SUSP: 30.0000 mL | Freq: Every day | ORAL | Status: DC | PRN

## 2024-05-07 MED ORDER — ALUM & MAG HYDROXIDE-SIMETH 200-200-20 MG/5ML PO SUSP: 30.0000 mL | ORAL | Status: DC | PRN

## 2024-05-07 MED ORDER — PALIPERIDONE ER 3 MG PO TB24
3.0000 mg | ORAL_TABLET | Freq: Every day | ORAL | Status: DC
  Administered 2024-05-08: 3 mg via ORAL
  Filled 2024-05-07: qty 1

## 2024-05-07 MED ORDER — HYDROXYZINE HCL 25 MG PO TABS
25.0000 mg | ORAL_TABLET | Freq: Three times a day (TID) | ORAL | Status: DC | PRN
  Administered 2024-05-07: 25 mg via ORAL
  Filled 2024-05-07: qty 1

## 2024-05-07 MED ORDER — BUPROPION HCL ER (XL) 300 MG PO TB24
300.0000 mg | ORAL_TABLET | Freq: Every morning | ORAL | Status: DC
  Administered 2024-05-07: 300 mg via ORAL
  Filled 2024-05-07: qty 1

## 2024-05-07 MED ORDER — AMLODIPINE BESYLATE 5 MG PO TABS
5.0000 mg | ORAL_TABLET | Freq: Every day | ORAL | Status: DC
  Administered 2024-05-08 – 2024-05-16 (×9): 5 mg via ORAL
  Filled 2024-05-07 (×9): qty 1

## 2024-05-07 MED ORDER — LAMOTRIGINE ER 50 MG PO TB24
50.0000 mg | ORAL_TABLET | Freq: Every day | ORAL | Status: DC
  Administered 2024-05-08 – 2024-05-15 (×8): 50 mg via ORAL
  Filled 2024-05-07 (×8): qty 1

## 2024-05-07 NOTE — Progress Notes (Signed)
 Pt has been accepted to [facility] on 05/07/2024.   Pt meets inpatient criteria per Wyline Pizza, NP   Attending Physician will be Dr. Ma   Report can be called to:  Adult unit: [phone]  Pt can arrive at 2100  Care Team Notified: [facility] Burnard Barter, RN, Wyline Pizza, NP, Dorla Jung, RN

## 2024-05-07 NOTE — Progress Notes (Signed)
 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 Pt denies current homicidal ideation. Pt advised to notify staff when having intrusive thoughts of hurting self or others. Pt verbalized understanding. Pt is alert, oriented  and appears preoccupied. Pt is ambulatory and is oriented to staff/unit. Pt denies pain and current SI/AVH, plan or intent. Staff will monitor for pt's safety.

## 2024-05-07 NOTE — Discharge Instructions (Signed)
 Patient accepted to [facility] today, 05/07/24

## 2024-05-07 NOTE — BH Assessment (Signed)
 Comprehensive Clinical Assessment (CCA) Note  05/07/2024 Scott Vargas [id number]  DISPOSITION: Patient meets inpatient criteria as bed placement is investigated.   The patient demonstrates the following risk factors for suicide: Chronic risk factors for suicide include: psychiatric disorder of Schizoaffective . Acute risk factors for suicide include: family or marital conflict. Protective factors for this patient include: coping skills. Considering these factors, the overall suicide risk at this point appears to be high. Patient is not appropriate for outpatient follow up.   Patient is a 28 year old male that presents this date as a voluntary walk in to [facility] brought in by family members after patient started having thoughts of harming different individuals. Patient has a PMHx significant for Schizoaffective Disorder and receives OP services from [facility] who assists with medication management (See MAR). Patient denies any SI, HI or AVH at the time of assessment although reports that he woke up in the middle of the night last night and was having thoughts of getting a kitchen knife and stab his family. He states that he has been having recent thoughts to kill his family because he grew up in a christian home with, to many rules. Patient states he has, thought about it for a long time and it wasn't right to be subjected to that. Patient denies any active intent at the time of assessment.  He also reports that he has been having violent racing thoughts towards black man between the ages of 65-29 although is vague in reference to intent. Patient would not elaborate on why he is directing his anger at that particular group of individuals although states his grandfather was a racist. Patient states he has had dreams where he was joining a group of white supremacist to, get the job done. Patient has a history of delusions and was seen back in 2021 when he presented with similar symptoms although was  very paranoid at that time believing that his pareants were going to kill him. Patient also reports that he feels he is having a mental health break because the thoughts in his head he knows, aren't right, and he is agreeing to a voluntary admission this date.   Patient states for the past 3 months his mood and the violent thoughts have been getting worse. He reports experiencing mood swings of anger, racing thoughts and struggling to sleep. He states that he feels like his appetite has increased over the past 2 months since restarting one of his medications (invega  injection). He denies suicidal thoughts. He denies past suicide attempts but reports suicidal thoughts years ago to shoot himself. He denies auditory or visual hallucinations. Objectively, no signs of acute psychosis. He denies physical aggression towards others and states that it's more internal thoughts. He states that for the past couple years he has been having thoughts and desires that he is bisexual. He reports occasional alcohol use, on average he drinks 3-4 times per week, a half case of beer. He reports occasional marijuana use 2 months ago and states that he has not used recently. Patient denies access to weapons.  Patient resides with his mother, father and twin brother. He is currently unemployed and states that he lost his job at [employer] in April when his mental health started to spiral out of control. He states that he was kicked out of his parent's house at that time and was sent to live with his grandfather in [place]. He states that three weeks ago he was hospitalized in [place] because  he was having thoughts to kill people and was only there for 3 days. He states that he has been hospitalized last at least 5 times in the past. He denies legal issues.  Patient is alert and oriented x 5. Patient is observed to be somewhat guarded and suspicious. Patient speaks in a normal voice with clear tone and volume. Patient's memory appears  to be intact with thoughts slightly disorganized. Patient's mood is suspicious with affect congruent. Patient does not appear to be responding to internal stimuli.   . Chief Complaint:  Chief Complaint  Patient presents with   Homicidal   Visit Diagnosis: Schizoaffective disorder     CCA Screening, Triage and Referral (STR)  Patient Reported Information How did you hear about us ? Self  What Is the Reason for Your Visit/Call Today? Pt reports he has been having violent thoughts and wants to be evaluated for these ongoing thoughts. Pt mentions he is always hearing voices and is wanting to find a way to make these thoughts stop. Pt is taking his prescribed medication. However, pt states he is looking for a medication to calm him down. Pt denies substance use, Si, Hi and AVH. Per chart review, pt has a diagnosis of Schizoaffective Disorder. Pt also states he does not want to be admitted.  How Long Has This Been Causing You Problems? 1 wk - 1 month  What Do You Feel Would Help You the Most Today? Treatment for Depression or other mood problem   Have You Recently Had Any Thoughts About Hurting Yourself? No  Are You Planning to Commit Suicide/Harm Yourself At This time? No   Flowsheet Row ED from 05/07/2024 in [facility]  C-SSRS RISK CATEGORY No Risk    Have you Recently Had Thoughts About Hurting Someone [name]? Yes  Are You Planning to Harm Someone at This Time? No  Explanation: Patient states he has thoughts to harm others although does not plan on acting on them.   Have You Used Any Alcohol or Drugs in the Past 24 Hours? No  How Long Ago Did You Use Drugs or Alcohol? NA What Did You Use and How Much? NA  Do You Currently Have a Therapist/Psychiatrist? Yes  Name of Therapist/Psychiatrist: Name of Therapist/Psychiatrist: [facility]   Have You Been Recently Discharged From Any Office Practice or Programs? No  Explanation of Discharge From  Practice/Program: NA    CCA Screening Triage Referral Assessment Type of Contact: Face-to-Face  Telemedicine Service Delivery:  face to face Is this Initial or Reassessment? initial  Date Telepsych consult ordered in [facility]: 05/07/2024   Time Telepsych consult ordered in [facility]:  05/07/2024  Location of Assessment: [facility] Assessment Services  Provider Location: [facility] Assessment Services   Collateral Involvement: None noted   Does Patient Have a Automotive engineer Guardian? No  Legal Guardian Contact Information: NA  Copy of Legal Guardianship Form: -- (NA)  Legal Guardian Notified of Arrival: -- (NA)  Legal Guardian Notified of Pending Discharge: -- (NA)  If Minor and Not Living with Parent(s), Who has Custody? NA  Is CPS involved or ever been involved? Never  Is APS involved or ever been involved? Never   Patient Determined To Be At Risk for Harm To Self or Others Based on Review of Patient Reported Information or Presenting Complaint? Yes, for Harm to Others  Method: Plan without intent  Availability of Means: Has close by  Intent: Vague intent or NA  Notification Required:  No need or identified person  Additional Information for Danger to Others Potential: -- (NA)  Additional Comments for Danger to Others Potential: None noted  Are There Guns or Other Weapons in Your Home? No  Types of Guns/Weapons: NA  Are These Weapons Safely Secured?                            -- (NA)  Who Could Verify You Are Able To Have These Secured: NA  Do You Have any Outstanding Charges, Pending Court Dates, Parole/Probation? Patient denies  Contacted To Inform of Risk of Harm To Self or Others: Other: Comment (NA)    Does Patient Present under Involuntary Commitment? No    [place] of Residence: [place]   Patient Currently Receiving the Following Services: Medication Management   Determination of Need: Urgent (48 hours)   Options For Referral: Inpatient  Hospitalization     CCA Biopsychosocial Patient Reported Schizophrenia/Schizoaffective Diagnosis in Past: Yes   Strengths: Pt is willing to participate in treatment   Mental Health Symptoms Depression:  Change in energy/activity; Hopelessness; Difficulty Concentrating   Duration of Depressive symptoms: Duration of Depressive Symptoms: Greater than two weeks   Mania:  None   Anxiety:   Difficulty concentrating   Psychosis:  None   Duration of Psychotic symptoms: Duration of Psychotic Symptoms: -- (NA)   Trauma:  None   Obsessions:  None   Compulsions:  N/A   Inattention:  N/A   Hyperactivity/Impulsivity:  N/A   Oppositional/Defiant Behaviors:  Aggression towards people/animals   Emotional Irregularity:  Chronic feelings of emptiness; Unstable self-image   Other Mood/Personality Symptoms:  Delusional thinking    Mental Status Exam Appearance and self-care  Stature:  Average   Weight:  Average weight   Clothing:  Casual   Grooming:  Normal   Cosmetic use:  None   Posture/gait:  Normal   Motor activity:  Agitated   Sensorium  Attention:  Confused; Distractible   Concentration:  Anxiety interferes   Orientation:  Object; Person; Place; Situation; Time   Recall/memory:  Normal   Affect and Mood  Affect:  Anxious   Mood:  Anxious   Relating  Eye contact:  Avoided   Facial expression:  Anxious   Attitude toward examiner:  Cooperative   Thought and Language  Speech flow: Pressured   Thought content:  Appropriate to Mood and Circumstances   Preoccupation:  None (NA)   Hallucinations:  None   Organization:  Circumstantial   Company secretary of Knowledge:  Fair   Intelligence:  Average   Abstraction:  Normal   Judgement:  Fair   Dance movement psychotherapist:  Distorted   Insight:  Fair   Decision Making:  Impulsive   Social Functioning  Social Maturity:  Responsible   Social Judgement:  Normal   Stress  Stressors:  Family  conflict   Coping Ability:  Normal   Skill Deficits:  None   Supports:  Family     Religion: Religion/Spirituality Are You A Religious Person?: No How Might This Affect Treatment?: NA  Leisure/Recreation: Leisure / Recreation Do You Have Hobbies?: No  Exercise/Diet: Exercise/Diet Do You Exercise?: No Have You Gained or Lost A Significant Amount of Weight in the Past Six Months?: No Do You Follow a Special Diet?: No Do You Have Any Trouble Sleeping?: No   CCA Employment/Education Employment/Work Situation: Employment / Work Situation Employment Situation: Unemployed Patient's Job has Been Impacted  by Current Illness: No Has Patient ever Been in the Military?: No  Education: Education Is Patient Currently Attending School?: No Last Grade Completed: 12 Did You Attend College?: No Did You Have An Individualized Education Program (IIEP): No Did You Have Any Difficulty At School?: No Patient's Education Has Been Impacted by Current Illness: No   CCA Family/Childhood History Family and Relationship History: Family history Marital status: Single Does patient have children?: No  Childhood History:  Childhood History By whom was/is the patient raised?: Both parents Did patient suffer any verbal/emotional/physical/sexual abuse as a child?: No Did patient suffer from severe childhood neglect?: No Has patient ever been sexually abused/assaulted/raped as an adolescent or adult?: No Was the patient ever a victim of a crime or a disaster?: No Witnessed domestic violence?: No Has patient been affected by domestic violence as an adult?: No       CCA Substance Use Alcohol/Drug Use: Alcohol / Drug Use Pain Medications: See MAR Prescriptions: See MAR Over the Counter: See MAR History of alcohol / drug use?: No history of alcohol / drug abuse Longest period of sobriety (when/how long): NA Negative Consequences of Use:  (NA) Withdrawal Symptoms:  (NA)                          ASAM's:  Six Dimensions of Multidimensional Assessment  Dimension 1:  Acute Intoxication and/or Withdrawal Potential:   Dimension 1:  Description of individual's past and current experiences of substance use and withdrawal: NA  Dimension 2:  Biomedical Conditions and Complications:   Dimension 2:  Description of patient's biomedical conditions and  complications: NA  Dimension 3:  Emotional, Behavioral, or Cognitive Conditions and Complications:  Dimension 3:  Description of emotional, behavioral, or cognitive conditions and complications: NA  Dimension 4:  Readiness to Change:  Dimension 4:  Description of Readiness to Change criteria: NA  Dimension 5:  Relapse, Continued use, or Continued Problem Potential:  Dimension 5:  Relapse, continued use, or continued problem potential critiera description: NA  Dimension 6:  Recovery/Living Environment:  Dimension 6:  Recovery/Iiving environment criteria description: NA  ASAM Severity Score:    ASAM Recommended Level of Treatment: ASAM Recommended Level of Treatment:  (NA)   Substance use Disorder (SUD) Substance Use Disorder (SUD)  Checklist Symptoms of Substance Use:  (NA)  Recommendations for Services/Supports/Treatments: Recommendations for Services/Supports/Treatments Recommendations For Services/Supports/Treatments: Inpatient Hospitalization  Disposition Recommendation per psychiatric provider: We recommend inpatient psychiatric hospitalization when medically cleared. Patient is under voluntary admission status at this time; please IVC if attempts to leave hospital.   DSM5 Diagnoses: There are no active problems to display for this patient.    Referrals to Alternative Service(s): Referred to Alternative Service(s):   Place:   Date:   Time:    Referred to Alternative Service(s):   Place:   Date:   Time:    Referred to Alternative Service(s):   Place:   Date:   Time:    Referred to Alternative Service(s):   Place:    Date:   Time:     Alm LITTIE Furth, LCAS

## 2024-05-07 NOTE — ED Provider Notes (Signed)
 [facility] Continuous Assessment Admission H&P  Date: 05/07/24 Patient Name: Scott Vargas MRN: [id number] Chief Complaint: Violent thoughts  Diagnoses:  Final diagnoses:  Schizoaffective disorder, unspecified type [facility])    HPI: Scott Vargas is a 28 year old male patient with a past psychiatric history significant for schizoaffective disorder and alcohol use disorder who presented to the [facility] voluntary accompanied by his parents and his twin brother with complaints of violent thoughts towards others. Patient states that he woke up in the middle of the night last night and was having thoughts to get a kitchen knife and stab his family. He states that he has been having  recent thoughts to kill his family and the pastor because he grew up in a Christian and strict home. He states that he believes in a higher power but is no longer Christian. He also reports that he has been having violent racing thoughts towards black man between the ages of [age] as he is leaning towards Rydan Gulyas supremacy lately. He states that his grandfather is races and he has been living with him and that he has developed these thoughts on his own. He states that for the past 3 months his mood and the violent thoughts have been getting worse. He reports experiencing mood swings of anger, racing thoughts and struggling to sleep. He states that he feels like his appetite has increased over the past 2 months since restarting the Invega  injection. He denies suicidal thoughts. He denies past suicide attempts but reports suicidal thoughts years ago to shoot himself. He denies auditory or visual hallucinations. Objectively, no signs of acute psychosis. He denies physical aggression towards others and states that it's more internal thoughts. He states that for the past couple years he has been having thoughts and desires that he is bisexual. He reports occasional alcohol use, on average he drinks 3-4  times per week, a half case of beer. He reports occasional marijuana use 2 months ago and states that he has not used recently. He reports outpatient psychiatry at [facility] and is prescribed Invega  117 mg every 4 weeks, last administered on Wednesday, May 05, 2024, Invega  3 mg po as needed, Klonopin  0.5 mg BID (last filled on 04/05/24 x 30 days), hydroxyzine  as needed, Wellbutrin  300 mg daily, and Lamictal  50 mg at bedtime. He reports counseling with [facility]. He reports a medical history of diabetes type 2 and hypertension and states that he is prescribed metformin  (unknown dose) daily and Mounjaro as needed and amlodipine  5 mg daily. He resides with his mother, father and twin brother. He is currently unemployed and states that he lost his job at [employer] in April when his mental health started to spiral out of control. He states that he was kicked out of his parent's house at that time and was sent to live with his grandfather in [place]. He states that three weeks ago he was hospitalized in [facility] because he was having thoughts to kill people and was only there for 3 days. He states that he has been hospitalized last at least 5 times in the past. He denies legal issues.  With the patient's consent I spoke to his parents, the patient's mother states that the patient woke her up at 1 AM and said that he was having some initiations in his sleep and states that he was having thoughts of wanting to kill his family. She states that he has been having homicidal thoughts for 4  years and that he will dig graves in their yard. She states that once he completes digging out a 6 foot grave he would cover it up and build another one. The patient's father states that the patient has been getting worse over the past couple months since he was weaned off of his Invega  injection earlier this year per his request due to concerns for weight gain. Patient was recently started back on the Invega  injection 2  months ago and got his second injection on Wednesday by nurse practitioner name May, at [facility]. He states that the patient was recently hospitalized in [facility] for a couple days due to homicidal thoughts and released and he was hospitalized at [facility] in [place]  due to homicidal thoughts within the past couple months.  Total Time spent with patient: 45 minutes  Musculoskeletal  Strength & Muscle Tone: within normal limits Gait & Station: normal Patient leans: N/A  Psychiatric Specialty Exam  Presentation General Appearance:  Appropriate for Environment  Eye Contact: Fair  Speech: Clear and Coherent  Speech Volume: Normal  Handedness: Right   Mood and Affect  Mood: Dysphoric  Affect: Congruent   Thought Process  Thought Processes: Coherent  Descriptions of Associations:Intact  Orientation:Full (Time, Place and Person)  Thought Content:Illogical; Paranoid Ideation  Diagnosis of Schizophrenia or Schizoaffective disorder in past: Yes  Duration of Psychotic Symptoms: Greater than six months  Hallucinations:Hallucinations: None  Ideas of Reference:Paranoia  Suicidal Thoughts:Suicidal Thoughts: No  Homicidal Thoughts:Homicidal Thoughts: Yes, Active   Sensorium  Memory: Immediate Fair; Recent Fair; Remote Fair  Judgment: Poor  Insight: Poor   Executive Functions  Concentration: Fair  Attention Span: Fair  Recall: [employer] of Knowledge: Fair  Language: Fair   Psychomotor Activity  Psychomotor Activity: Psychomotor Activity: Normal   Assets  Assets: Communication Skills; Leisure Time; Housing; Social Support   Sleep  Sleep: Sleep: Poor   Nutritional Assessment (For OBS and FBC admissions only) Has the patient had a weight loss or gain of 10 pounds or more in the last 3 months?: No Has the patient had a decrease in food intake/or appetite?: No Does the patient have dental  problems?: No Does the patient have eating habits or behaviors that may be indicators of an eating disorder including binging or inducing vomiting?: No Has the patient recently lost weight without trying?: 0 Has the patient been eating poorly because of a decreased appetite?: 0 Malnutrition Screening Tool Score: 0    Physical Exam Cardiovascular:     Rate and Rhythm: Normal rate.  Pulmonary:     Effort: Pulmonary effort is normal.  Musculoskeletal:        General: Normal range of motion.     Cervical back: Normal range of motion.  Neurological:     Mental Status: He is alert and oriented to person, place, and time.    Review of Systems  Constitutional: Negative.   HENT: Negative.    Eyes: Negative.   Respiratory: Negative.    Cardiovascular: Negative.   Gastrointestinal: Negative.   Genitourinary: Negative.   Musculoskeletal: Negative.   Neurological: Negative.   Endo/Heme/Allergies: Negative.     Blood pressure (!) 152/96, pulse 92, temperature 98.9 F (37.2 C), temperature source Oral, resp. rate 20, SpO2 99%. There is no height or weight on file to calculate BMI.  Past Psychiatric History: History of schizoaffective disorder, and alcohol use disorder. Patient recently hospitalized at [facility] in [place] for posting homicidal thoughts  on facebook last month. Patient reports 5 inpatient psychiatric hospitalizations.  Is the patient at risk to self? No  Has the patient been a risk to self in the past 6 months? No .    Has the patient been a risk to self within the distant past? Yes   Is the patient a risk to others? Yes   Has the patient been a risk to others in the past 6 months? Yes   Has the patient been a risk to others within the distant past? Yes   Past Medical History: History of diabetes type 2 and hypertension.   Family History: No reported history.  Social History: Patient resides with his mother, father and twin brother. He also has 2 older  siblings who do not live in the home. He is unemployed. He reports occasional alcohol use. He reports occasional marijuana use.  Last Labs:  No visits with results within 6 Month(s) from this visit.  Latest known visit with results is:  Admission on 08/25/2020, Discharged on 08/25/2020  Component Date Value Ref Range Status   Sodium 08/25/2020 138  135 - 145 mmol/L Final   Potassium 08/25/2020 4.1  3.5 - 5.1 mmol/L Final   Chloride 08/25/2020 101  98 - 111 mmol/L Final   CO2 08/25/2020 26  22 - 32 mmol/L Final   Glucose, Bld 08/25/2020 95  70 - 99 mg/dL Final   Glucose reference range applies only to samples taken after fasting for at least 8 hours.   BUN 08/25/2020 12  6 - 20 mg/dL Final   Creatinine, Ser 08/25/2020 0.92  0.61 - 1.24 mg/dL Final   Calcium 89/70/7978 9.1  8.9 - 10.3 mg/dL Final   Total Protein 89/70/7978 7.0  6.5 - 8.1 g/dL Final   Albumin 89/70/7978 4.2  3.5 - 5.0 g/dL Final   AST 89/70/7978 21  15 - 41 U/L Final   ALT 08/25/2020 17  0 - 44 U/L Final   Alkaline Phosphatase 08/25/2020 59  38 - 126 U/L Final   Total Bilirubin 08/25/2020 0.7  0.3 - 1.2 mg/dL Final   GFR, Estimated 08/25/2020 >60  >60 mL/min Final   Comment: (NOTE) Calculated using the CKD-EPI Creatinine Equation (2021)    Anion gap 08/25/2020 11  5 - 15 Final   Performed at [facility] Lab, [address] [place], [place] [address]   Alcohol, Ethyl (B) 08/25/2020 <10  <10 mg/dL Final   Comment: (NOTE) Lowest detectable limit for serum alcohol is 10 mg/dL.  For medical purposes only. Performed at [facility], [address] [place], [place] [address]    Salicylate Lvl 08/25/2020 <7.0 (L)  7.0 - 30.0 mg/dL Final   Performed at [facility], [address], [place], [place] [address]   Acetaminophen  (Tylenol ), Serum 08/25/2020 <10 (L)  10 - 30 ug/mL Final   Comment: (NOTE) Therapeutic concentrations vary significantly. A range of 10-30 ug/mL  may be an effective concentration for  many patients. However, some  are best treated at concentrations outside of this range. Acetaminophen  concentrations >150 ug/mL at 4 hours after ingestion  and >50 ug/mL at 12 hours after ingestion are often associated with  toxic reactions.  Performed at [facility] Lab, [address], [place], [place] [address]    WBC 08/25/2020 8.2  4.0 - 10.5 K/uL Final   RBC 08/25/2020 5.20  4.22 - 5.81 MIL/uL Final   Hemoglobin 08/25/2020 14.8  13.0 - 17.0  g/dL Final   HCT 89/70/7978 45.6  39.0 - 52.0 % Final   MCV 08/25/2020 87.7  80.0 - 100.0 fL Final   MCH 08/25/2020 28.5  26.0 - 34.0 pg Final   MCHC 08/25/2020 32.5  30.0 - 36.0 g/dL Final   RDW 89/70/7978 12.1  11.5 - 15.5 % Final   Platelets 08/25/2020 293  150 - 400 K/uL Final   nRBC 08/25/2020 0.0  0.0 - 0.2 % Final   Performed at [facility] Lab, [address], [place], [place] [address]   Opiates 08/25/2020 NONE DETECTED  NONE DETECTED Final   Cocaine 08/25/2020 NONE DETECTED  NONE DETECTED Final   Benzodiazepines 08/25/2020 NONE DETECTED  NONE DETECTED Final   Amphetamines 08/25/2020 NONE DETECTED  NONE DETECTED Final   Tetrahydrocannabinol 08/25/2020 NONE DETECTED  NONE DETECTED Final   Barbiturates 08/25/2020 NONE DETECTED  NONE DETECTED Final   Comment: (NOTE) DRUG SCREEN FOR MEDICAL PURPOSES ONLY.  IF CONFIRMATION IS NEEDED FOR ANY PURPOSE, NOTIFY LAB WITHIN 5 DAYS.  LOWEST DETECTABLE LIMITS FOR URINE DRUG SCREEN Drug Class                     Cutoff (ng/mL) Amphetamine and metabolites    1000 Barbiturate and metabolites    200 Benzodiazepine                 200 Tricyclics and metabolites     300 Opiates and metabolites        300 Cocaine and metabolites        300 THC                            50 Performed at [facility] Lab, [address] [place], [place] [address]    TSH 08/25/2020 1.019  0.350 - 4.500 uIU/mL Final   Comment: Performed by a 3rd Generation assay with a functional sensitivity of <=0.01  uIU/mL. Performed at [facility] Lab, [address] [place], [place] [address]    SARS Coronavirus 2 by RT PCR 08/25/2020 NEGATIVE  NEGATIVE Final   Comment: (NOTE) SARS-CoV-2 target nucleic acids are NOT DETECTED.  The SARS-CoV-2 RNA is generally detectable in upper respiratoy specimens during the acute phase of infection. The lowest concentration of SARS-CoV-2 viral copies this assay can detect is 131 copies/mL. A negative result does not preclude SARS-Cov-2 infection and should not be used as the sole basis for treatment or other patient management decisions. A negative result may occur with  improper specimen collection/handling, submission of specimen other than nasopharyngeal swab, presence of viral mutation(s) within the areas targeted by this assay, and inadequate number of viral copies (<131 copies/mL). A negative result must be combined with clinical observations, patient history, and epidemiological information. The expected result is Negative.  Fact Sheet for Patients:  https://www.moore.com/  Fact Sheet for Healthcare Providers:  https://www.young.biz/  This test is no                          t yet approved or cleared by the [place]  FDA and  has been authorized for detection and/or diagnosis of SARS-CoV-2 by FDA under an Emergency Use Authorization (EUA). This EUA will remain  in effect (meaning this test can be used) for the duration of the COVID-19 declaration under Section 564(b)(1) of the Act, 21 U.S.C. section 360bbb-3(b)(1), unless the authorization is terminated or  revoked sooner.     Influenza A by PCR 08/25/2020 NEGATIVE  NEGATIVE Final   Influenza B by PCR 08/25/2020 NEGATIVE  NEGATIVE Final   Comment: (NOTE) The Xpert Xpress SARS-CoV-2/FLU/RSV assay is intended as an aid in  the diagnosis of influenza from Nasopharyngeal swab specimens and  should not be used as a sole basis for treatment. Nasal  washings and  aspirates are unacceptable for Xpert Xpress SARS-CoV-2/FLU/RSV  testing.  Fact Sheet for Patients: https://www.moore.com/  Fact Sheet for Healthcare Providers: https://www.young.biz/  This test is not yet approved or cleared by the [place]  FDA and  has been authorized for detection and/or diagnosis of SARS-CoV-2 by  FDA under an Emergency Use Authorization (EUA). This EUA will remain  in effect (meaning this test can be used) for the duration of the  Covid-19 declaration under Section 564(b)(1) of the Act, 21  U.S.C. section 360bbb-3(b)(1), unless the authorization is  terminated or revoked. Performed at [facility] Lab, [address] [place], [place] [address]     Allergies: Patient has no known allergies.  Medications:  Facility Ordered Medications  Medication   acetaminophen  (TYLENOL ) tablet 650 mg   alum & mag hydroxide-simeth (MAALOX/MYLANTA) 200-200-20 MG/5ML suspension 30 mL   magnesium  hydroxide (MILK OF MAGNESIA) suspension 30 mL   haloperidol  (HALDOL ) tablet 5 mg   And   diphenhydrAMINE  (BENADRYL ) capsule 50 mg   haloperidol  lactate (HALDOL ) injection 5 mg   And   diphenhydrAMINE  (BENADRYL ) injection 50 mg   And   LORazepam  (ATIVAN ) injection 2 mg   haloperidol  lactate (HALDOL ) injection 10 mg   And   diphenhydrAMINE  (BENADRYL ) injection 50 mg   And   LORazepam  (ATIVAN ) injection 2 mg   hydrOXYzine  (ATARAX ) tablet 25 mg   traZODone  (DESYREL ) tablet 50 mg   PTA Medications  Medication Sig   hydrOXYzine  (ATARAX ) 25 MG tablet TAKE 1-2 TABLETS AT NIGHT FOR SLEEP (Patient taking differently: TAKE 1-2 TABLETS AT NIGHT FOR SLEEP - takes somewhat)   diphenhydrAMINE  (BENADRYL ) 25 MG tablet Take 50 mg by mouth at bedtime as needed. Taking for allergies and a sleep aid   Cetirizine HCl (ZYRTEC ALLERGY PO) Take by mouth at bedtime. Unsure of dosing. Takes with Benadryl  for allergies and sleep.    propranolol  (INDERAL ) 20 MG tablet Take 1 tablet (20 mg total) by mouth 2 (two) times daily.   baclofen (LIORESAL) 10 MG tablet Take 10 mg by mouth. Once or twice a week   zonisamide (ZONEGRAN) 25 MG capsule Take 100 mg by mouth at bedtime.   MOUNJARO 2.5 MG/0.5ML Pen Inject 2.5 mg into the skin once a week.   clonazePAM  (KLONOPIN ) 0.5 MG tablet Take 1 tablet (0.5 mg total) by mouth 2 (two) times daily.   Paliperidone  ER (INVEGA  SUSTENNA) injection Inject 117 mg into the muscle every 30 (thirty) days.   paliperidone  (INVEGA ) 3 MG 24 hr tablet Take 1 tablet (3 mg total) by mouth daily.    [facility] MSE Discharge Disposition for Follow up and Recommendations: Patient is recommended for inpatient psychiatric treatment for active HI and for mood stabilization. Patient consents for inpatient psychiatric treatment at this time. However, if patient request to leave will be place under involuntary commitment. IVC process was explained to the patient. Patient accepted to [facility] today, 05/07/24.  Restart home medications Invega  117 mg administered on May 05, 2024 per patient  Restart Invega  3 mg po daily for mood stabilization Restart Wellbutrin  300 mg po daily  for mood stabilization Restart Lamictal  50 mg po daily for mood stabilization Restart amlodipine  5 mg po daily for HTN  Lab Orders         CBC with Differential/Platelet         Comprehensive metabolic panel         Hemoglobin A1c         Ethanol         Lipid panel         TSH         RPR         HIV Antibody (routine testing w rflx)         POCT Urine Drug Screen - (I-Screen)         EKG  Teresa Wyline CROME, NP 05/07/24  12:01 PM

## 2024-05-07 NOTE — Progress Notes (Signed)
   05/07/24 1044  [facility] Triage Screening (Walk-ins at [facility] only)  How Did You Hear About Us ? Family/Friend  What Is the Reason for Your Visit/Call Today? Pt reports he has been having violent thoughts and wants to be evaluated for these ongoing thoughts. Pt mentions he is always hearing voices and is wanting to find a way to make these thoughts stop. Pt is taking his prescribed medication. However, pt states he is looking for a medication to calm him down. Pt denies substance use, Si, Hi and AVH. Per chart review, pt has a diagnosis of Schizoaffective Disorder. Pt also states he does not want to be admitted.  How Long Has This Been Causing You Problems? > than 6 months  Have You Recently Had Any Thoughts About Hurting Yourself? No  Are You Planning to Commit Suicide/Harm Yourself At This time? No  Have you Recently Had Thoughts About Hurting Someone [name]? Yes  How long ago did you have thoughts of harming others? today  Are You Planning To Harm Someone At This Time? No  Physical Abuse Denies  Verbal Abuse Denies  Sexual Abuse Denies  Exploitation of patient/patient's resources Denies  Self-Neglect Denies  Possible abuse reported to: Other (Comment)  Are you currently experiencing any auditory, visual or other hallucinations? No  Have You Used Any Alcohol or Drugs in the Past 24 Hours? No  Do you have any current medical co-morbidities that require immediate attention? No  Clinician description of patient physical appearance/behavior: cooperative, calm  What Do You Feel Would Help You the Most Today? Medication(s)  If access to [facility] was not available, would you have sought care in the Emergency Department? No  Determination of Need Urgent (48 hours)  Options For Referral Medication Management;Inpatient Hospitalization  Determination of Need filed? Yes

## 2024-05-08 ENCOUNTER — Other Ambulatory Visit: Payer: Self-pay

## 2024-05-08 ENCOUNTER — Encounter (HOSPITAL_COMMUNITY): Payer: Self-pay | Admitting: Behavioral Health

## 2024-05-08 DIAGNOSIS — F250 Schizoaffective disorder, bipolar type: Secondary | ICD-10-CM | POA: Diagnosis not present

## 2024-05-08 LAB — RPR: RPR Ser Ql: NONREACTIVE

## 2024-05-08 LAB — HEMOGLOBIN A1C
Hgb A1c MFr Bld: 5.5 % (ref 4.8–5.6)
Mean Plasma Glucose: 111 mg/dL

## 2024-05-08 MED ORDER — BUPROPION HCL ER (XL) 150 MG PO TB24
150.0000 mg | ORAL_TABLET | Freq: Every morning | ORAL | Status: DC
  Administered 2024-05-09 – 2024-05-10 (×2): 150 mg via ORAL
  Filled 2024-05-08 (×2): qty 1

## 2024-05-08 MED ORDER — OXYMETAZOLINE HCL 0.05 % NA SOLN
1.0000 | Freq: Two times a day (BID) | NASAL | Status: AC
  Administered 2024-05-08 – 2024-05-10 (×5): 1 via NASAL
  Filled 2024-05-08: qty 30

## 2024-05-08 MED ORDER — CLONAZEPAM 0.5 MG PO TABS
0.5000 mg | ORAL_TABLET | Freq: Two times a day (BID) | ORAL | Status: DC
  Administered 2024-05-08 – 2024-05-12 (×9): 0.5 mg via ORAL
  Filled 2024-05-08 (×9): qty 1

## 2024-05-08 MED ORDER — SERTRALINE HCL 50 MG PO TABS
50.0000 mg | ORAL_TABLET | Freq: Every day | ORAL | Status: DC
  Administered 2024-05-08 – 2024-05-10 (×3): 50 mg via ORAL
  Filled 2024-05-08 (×3): qty 1

## 2024-05-08 MED ORDER — PALIPERIDONE ER 6 MG PO TB24
6.0000 mg | ORAL_TABLET | Freq: Every day | ORAL | Status: DC
  Administered 2024-05-09: 6 mg via ORAL
  Filled 2024-05-08: qty 1

## 2024-05-08 NOTE — Plan of Care (Signed)

## 2024-05-08 NOTE — [facility] Suicide Risk Assessment (Signed)
 [facility] Admission Suicide Risk Assessment   Nursing information obtained from:  Patient Demographic factors:  Male, Caucasian Current Mental Status:  Thoughts of violence towards others Loss Factors:  Financial problems / change in socioeconomic status Historical Factors:  NA Risk Reduction Factors:  Positive social support  Total Time spent with patient: 15 minutes Principal Problem: Schizoaffective disorder (HCC) Diagnosis:  Principal Problem:   Schizoaffective disorder [facility])    Mr. Mccartney is a 28yo M with a psychiatric history of Schizoaffective disorder, anxiety, alcohol use disorder, who was admitted voluntary to [facility] due to homicidal ideations.   Subjective Data: denies suicidal ideations, plans, intents.  Continued Clinical Symptoms:  Alcohol Use Disorder Identification Test Final Score (AUDIT): 10 The Alcohol Use Disorders Identification Test, Guidelines for Use in Primary Care, Second Edition.  World Science writer [facility]). Score between 0-7:  no or low risk or alcohol related problems. Score between 8-15:  moderate risk of alcohol related problems. Score between 16-19:  high risk of alcohol related problems. Score 20 or above:  warrants further diagnostic evaluation for alcohol dependence and treatment.  MSE: Appearance:  CM, appearing stated age,  wearing appropriate to the situation hospital clothes, with fair grooming and hygiene. Normal level of alertness and appropriate facial expression.   Attitude/Behavior: calm, cooperative, engaging with appropriate eye contact.   Motor: WNL; dyskinesias not evident. Gait appears in full range.   Speech: spontaneous, clear, coherent, normal comprehension.   Mood: euthymic,  I am fine and good to go .   Affect: appropriately-reactive, full range.   Thought process: patient appears coherent, organized, logical, goal-directed, associations are appropriate.   Thought content: patient denies suicidal thoughts, denies  homicidal thoughts; did not express any delusions.   Thought perception: patient denies auditory and visual hallucinations. Did not appear internally stimulated.   Cognition: patient is alert and oriented in self, place, date; with intact attention and concentration.   Insight: limited, in regards of understanding of presence, nature, cause, and significance of mental or emotional problem.   Judgement: fair, in regards of ability to make good decisions concerning the appropriate thing to do in various situations, including ability to form opinions regarding their mental health condition.   Physical Exam: Physical Exam ROS Blood pressure 124/82, pulse 79, temperature 97.7 F (36.5 C), temperature source Oral, resp. rate 16, height 5' 10 (1.778 m), weight 89.4 kg, SpO2 97%. Body mass index is 28.27 kg/m.   COGNITIVE FEATURES THAT CONTRIBUTE TO RISK:  None    SUICIDE RISK:   Minimal: No identifiable suicidal ideation.  Patients presenting with no risk factors but with morbid ruminations; may be classified as minimal risk based on the severity of the depressive symptoms  PLAN OF CARE: see H&P  I certify that inpatient services furnished can reasonably be expected to improve the patient's condition.   Neil Appl, MD 05/08/2024, 10:41 AM

## 2024-05-08 NOTE — Plan of Care (Signed)
   Problem: Activity: Goal: Sleeping patterns will improve Outcome: Progressing   Problem: Safety: Goal: Periods of time without injury will increase Outcome: Progressing

## 2024-05-08 NOTE — Tx Team (Signed)
 Initial Treatment Plan 05/08/2024 12:55 AM [name] FMW:[id number]    PATIENT STRESSORS: Marital or family conflict   Medication change or noncompliance   Occupational concerns     PATIENT STRENGTHS: General fund of knowledge  Motivation for treatment/growth  Supportive family/friends    PATIENT IDENTIFIED PROBLEMS: Risk HI  Depression  Anger  Anger management, Meds that help control my mood               DISCHARGE CRITERIA:  Improved stabilization in mood, thinking, and/or behavior Verbal commitment to aftercare and medication compliance  PRELIMINARY DISCHARGE PLAN: Attend aftercare/continuing care group Outpatient therapy  PATIENT/FAMILY INVOLVEMENT: This treatment plan has been presented to and reviewed with the patient, Scott Vargas.  The patient and family have been given the opportunity to ask questions and make suggestions.  Orlando Ozell LABOR, RN 05/08/2024, 12:55 AM

## 2024-05-08 NOTE — Group Note (Signed)
 Date:  05/08/2024 Time:  10:02 PM  Group Topic/Focus:  Wrap-Up Group:   The focus of this group is to help patients review their daily goal of treatment and discuss progress on daily workbooks.    Participation Level:  Patient did not attend group  Participation Quality:  Patient did not attend   Affect:  Patient did not attend group  Cognitive:  Lacking  Insight: Lacking  Engagement in Group:  Lacking  Modes of Intervention:  Discussion  Additional Comments:  Patient did not attend group  Scott Vargas 05/08/2024, 10:02 PM

## 2024-05-08 NOTE — Progress Notes (Signed)
 D: Patient is alert, oriented, and cooperative. Patient endorses AH that are not command in nature, says this is normal for him. Denies SI, HI, VH, and verbally contracts for safety. Patient denies physical symptoms/pain.    A: Scheduled medications administered per MD order. Support provided. Patient educated on safety on the unit and medications. Routine safety checks every 15 minutes. Patient stated understanding to tell nurse about any new physical symptoms. Patient understands to tell staff of any needs.     R: No adverse drug reactions noted. Patient remains safe at this time and will continue to monitor.    05/08/24 0900  Psych Admission Type (Psych Patients Only)  Admission Status Voluntary  Psychosocial Assessment  Patient Complaints None  Eye Contact Fair  Facial Expression Flat  Affect Blunted  Speech Logical/coherent  Interaction Assertive  Motor Activity Other (Comment) (WNL)  Appearance/Hygiene Unremarkable  Behavior Characteristics Cooperative  Mood Anxious  Thought Process  Coherency WDL  Content WDL  Delusions None reported or observed  Perception Hallucinations  Hallucination Auditory  Judgment Limited  Confusion None  Danger to Self  Current suicidal ideation? Denies  Danger to Others  Danger to Others None reported or observed  Danger to Others Abnormal  Harmful Behavior to others No threats or harm toward other people  Destructive Behavior No threats or harm toward property

## 2024-05-08 NOTE — H&P (Addendum)
 Psychiatric Admission Assessment Adult  Vargas Identification: Scott Vargas MRN:  [id number] Date of Evaluation:  05/08/2024 Chief Complaint:  Schizoaffective disorder (HCC) [F25.9] Principal Diagnosis: Schizoaffective disorder (HCC) Diagnosis:  Principal Problem:   Schizoaffective disorder [facility])  Scott Vargas is a 28yo M with a psychiatric history of Schizoaffective disorder, anxiety, alcohol use disorder, who was admitted voluntary to [facility] due to homicidal ideations.   History of Present Illness:  Scott Vargas reports I am feeling good today, I think you can let me go. WE discussed circumstances of Scott current admission. Scott Vargas admitted to waking up yesterday suddenly in Scott middle of Scott night having Scott intrusive thoughts about harming his family using a kitchen knife, thet Scott Vargas reported to his parents. Scott Vargas denies having such thoughts currently. Over Scott last three months, Scott Vargas reports a worsening in his mood and thought content. Symptoms include mood instability marked by anger, racing thoughts, and disrupted sleep. Scott Vargas notes an increase in appetite over Scott past two months, which Scott Vargas attributes to restarting his Invega  injection. Scott Vargas further reported experiencing recurrent on-and off violent thoughts over Scott past several months, particularly directed toward his family and his pastor. Scott Vargas attributes some of these thoughts to his upbringing in a strict Christian household. While Scott Vargas still believes in a higher power, Scott Vargas no longer identifies as [place]. In addition, Scott Vargas shared that Scott Vargas has been experiencing racially charged intrusive thoughts directed at [facility] males aged 20-30. Scott Vargas described a recent gravitation toward [employer], which Scott Vargas believes developed during his stay with his grandfather, who holds racist views. Scott Vargas clarified that Scott violent ideation remains internal and has not translated into actions. Scott Vargas denies current homicidal thoughts, plans. Scott Vargas denies suicidal ideation and has  no history of suicide attempts. Scott Vargas denies auditory or visual hallucinations and reports no recent or past episodes of physical aggression.  Scott Vargas also mentioned that thinks Scott Vargas is depressed, based on episodes of low mood, feeling unhappy, unmotivated and tired. Scott Vargas is asking to start him on happy pill.  Scott Vargas receives outpatient psychiatric care at [facility]. His current medication regimen includes: Invega  117 mg IM every 4 weeks (last administered May 05, 2024) Invega  3 mg orally as needed Klonopin  0.5 mg twice daily (last filled April 05, 2024, 30-day supply) Hydroxyzine  as needed Wellbutrin  300 mg daily Lamictal  50 mg at bedtime Scott Vargas is also engaged in counseling services through [employer].  His medical history includes type 2 diabetes and hypertension. Scott Vargas takes metformin  (exact dosage unknown), Mounjaro as needed, and amlodipine  5 mg daily.  We discussed his psychotropic medications. I disclosed that in my opinion, Wellbutrin  may worsen or precipitate homicidal thoughts in rare cases and suggested to reduce Scott dose of Scott medication; Vargas agreed to try. Also, Scott Vargas may benefit from an addition of SSRI for treating his intrusive thoughts as well as depression. Scott Vargas disclosed Scott Vargas was thinking of trying Zoloft  for a while based on his own research.   Past Psychiatric History: Diagnoses: Schizoaffective disorder. Alcohol use disorder Outpatient Psychiatry: currently followed by [facility] Engaged in [facility] with [facility]: Hospitalized at least 5 times in Scott past. Most recent hospitalization: 3 weeks ago in [place] for homicidal ideation (3-day stay). No past suicide attempts. Reports passive suicidal thoughts years ago (e.g., thoughts of shooting himself)  Current Psychiatric Medications: Invega  117 mg IM every 4 weeks (last dose: 05/05/24) Invega  3 mg PO as needed Klonopin  0.5 mg BID (last filled:  04/05/24, 30-day supply) Hydroxyzine  as needed Wellbutrin  300 mg daily Lamictal  50 mg at bedtime  Past Medications: Risperidone , Invega .  Social History: Scott Vargas lives with his parents and twin brother. Scott Vargas is currently unemployed, stating that Scott Vargas lost his job at [employer] in April when his psychiatric symptoms began to deteriorate. During that period, Scott Vargas was asked to leave Scott family home and temporarily resided with his grandfather in [place] .  [place] Scale:  Flowsheet Row [facility] ([facility] Most recent reading at 05/07/2024 10:46 PM ED from 05/07/2024 in [facility] Most recent reading at 05/07/2024  1:00 PM  C-SSRS RISK CATEGORY No Risk No Risk     Alcohol Screening: 1. How often do you have a drink containing alcohol?: 2 to 3 times a week 2. How many drinks containing alcohol do you have on a typical day when you are drinking?: 3 or 4 3. How often do you have six or more drinks on one occasion?: Less than monthly AUDIT-C Score: 5 4. How often during Scott last year have you found that you were not able to stop drinking once you had started?: Never 5. How often during Scott last year have you failed to do what was normally expected from you because of drinking?: Never 6. How often during Scott last year have you needed a first drink in Scott morning to get yourself going after a heavy drinking session?: Never 7. How often during Scott last year have you had a feeling of guilt of remorse after drinking?: Never 8. How often during Scott last year have you been unable to remember what happened Scott night before because you had been drinking?: Less than monthly 9. Have you or someone else been injured as a result of your drinking?: No 10. Has a relative or friend or a doctor or another health worker been concerned about your drinking or suggested you cut down?: Yes, during Scott last year Alcohol Use Disorder  Identification Test Final Score (AUDIT): 10 Alcohol Brief Interventions/Follow-up: Vargas Refused  Past Medical History:  Past Medical History:  Diagnosis Date   Diabetes mellitus without complication (HCC)    Migraine    History reviewed. No pertinent surgical history. Family History: History reviewed. No pertinent family history. Family Psychiatric  History:  Tobacco Screening:  Social History   Tobacco Use  Smoking Status Light Smoker   Types: Cigars  Smokeless Tobacco Never    BH Tobacco Counseling     Are you interested in Tobacco Cessation Medications?  No, Vargas refused Counseled Vargas on smoking cessation:  Refused/Declined practical counseling Reason Tobacco Screening Not Completed: No value filed.       Social History:  Social History   Substance and Sexual Activity  Alcohol Use Yes     Social History   Substance and Sexual Activity  Drug Use Yes   Types: Marijuana    Additional Social History:        Allergies:  No Known Allergies Lab Results:  Results for orders placed or performed during Scott hospital encounter of 05/07/24 (from Scott past 48 hours)  CBC with Differential/Platelet     Status: None   Collection Time: 05/07/24 12:22 PM  Result Value Ref Range   WBC 7.4 4.0 - 10.5 K/uL   RBC 5.47 4.22 - 5.81 MIL/uL   Hemoglobin 15.0 13.0 - 17.0 g/dL   HCT 54.2 60.9 - 47.9 %   MCV 83.5 80.0 - 100.0  fL   MCH 27.4 26.0 - 34.0 pg   MCHC 32.8 30.0 - 36.0 g/dL   RDW 86.4 88.4 - 84.4 %   Platelets 324 150 - 400 K/uL   nRBC 0.0 0.0 - 0.2 %   Neutrophils Relative % 68 %   Neutro Abs 5.1 1.7 - 7.7 K/uL   Lymphocytes Relative 20 %   Lymphs Abs 1.4 0.7 - 4.0 K/uL   Monocytes Relative 8 %   Monocytes Absolute 0.6 0.1 - 1.0 K/uL   Eosinophils Relative 2 %   Eosinophils Absolute 0.2 0.0 - 0.5 K/uL   Basophils Relative 1 %   Basophils Absolute 0.1 0.0 - 0.1 K/uL   Immature Granulocytes 1 %   Abs Immature Granulocytes 0.05 0.00 - 0.07 K/uL     Comment: Performed at [facility], [address] [place], [place] [address]  Comprehensive metabolic panel     Status: None   Collection Time: 05/07/24 12:22 PM  Result Value Ref Range   Sodium 140 135 - 145 mmol/L   Potassium 4.0 3.5 - 5.1 mmol/L   Chloride 101 98 - 111 mmol/L   CO2 26 22 - 32 mmol/L   Glucose, Bld 89 70 - 99 mg/dL    Comment: Glucose reference range applies only to samples taken after fasting for at least 8 hours.   BUN 10 6 - 20 mg/dL   Creatinine, Ser 9.18 0.61 - 1.24 mg/dL   Calcium 9.7 8.9 - 89.6 mg/dL   Total Protein 8.1 6.5 - 8.1 g/dL   Albumin 4.4 3.5 - 5.0 g/dL   AST 19 15 - 41 U/L   ALT 24 0 - 44 U/L   Alkaline Phosphatase 66 38 - 126 U/L   Total Bilirubin 0.8 0.0 - 1.2 mg/dL   GFR, Estimated >39 >39 mL/min    Comment: (NOTE) Calculated using Scott CKD-EPI Creatinine Equation (2021)    Anion gap 13 5 - 15    Comment: Performed at [facility], [address], [place], [place] [address]  Hemoglobin A1c     Status: None   Collection Time: 05/07/24 12:22 PM  Result Value Ref Range   Hgb A1c MFr Bld 5.5 4.8 - 5.6 %    Comment: (NOTE)         Prediabetes: 5.7 - 6.4         Diabetes: >6.4         Glycemic control for adults with diabetes: <7.0    Mean Plasma Glucose 111 mg/dL    Comment: (NOTE) Performed At: [facility] [address] [place], [place] [id number] Jennette Shorter MD Ey:[id number]   Ethanol     Status: None   Collection Time: 05/07/24 12:22 PM  Result Value Ref Range   Alcohol, Ethyl (B) <15 <15 mg/dL    Comment: (NOTE) For medical purposes only. Performed at [facility], [address], [place], [place] [address]   Lipid panel     Status: Abnormal   Collection Time: 05/07/24 12:22 PM  Result Value Ref Range   Cholesterol 216 (H) 0 - 200 mg/dL   Triglycerides 71 <849 mg/dL   HDL 49 >59 mg/dL   Total CHOL/HDL Ratio 4.4 RATIO   VLDL 14 0 - 40 mg/dL   LDL Cholesterol 846 (H) 0 - 99 mg/dL    Comment:         Total Cholesterol/HDL:CHD Risk Coronary Heart Disease Risk Table  Men   Women  1/2 Average Risk   3.4   3.3  Average Risk       5.0   4.4  2 X Average Risk   9.6   7.1  3 X Average Risk  23.4   11.0        Use Scott calculated Vargas Ratio above and Scott CHD Risk Table to determine Scott Vargas's CHD Risk.        ATP III CLASSIFICATION (LDL):  <100     mg/dL   Optimal  899-870  mg/dL   Near or Above                    Optimal  130-159  mg/dL   Borderline  839-810  mg/dL   High  >809     mg/dL   Very High Performed at [facility], [address] [place], [place] [address]   TSH     Status: None   Collection Time: 05/07/24 12:22 PM  Result Value Ref Range   TSH 1.883 0.350 - 4.500 uIU/mL    Comment: Performed by a 3rd Generation assay with a functional sensitivity of <=0.01 uIU/mL. Performed at [facility], [address], [place], [place] [address]   HIV Antibody (routine testing w rflx)     Status: None   Collection Time: 05/07/24 12:22 PM  Result Value Ref Range   HIV Screen 4th Generation wRfx Non Reactive Non Reactive    Comment: Performed at [facility], [address], [place], [place] [address]  POCT Urine Drug Screen - (I-Screen)     Status: Normal   Collection Time: 05/07/24  1:24 PM  Result Value Ref Range   POC Amphetamine UR None Detected NONE DETECTED (Cut Off Level 1000 ng/mL)   POC Secobarbital (BAR) None Detected NONE DETECTED (Cut Off Level 300 ng/mL)   POC Buprenorphine (BUP) None Detected NONE DETECTED (Cut Off Level 10 ng/mL)   POC Oxazepam (BZO) None Detected NONE DETECTED (Cut Off Level 300 ng/mL)   POC Cocaine UR None Detected NONE DETECTED (Cut Off Level 300 ng/mL)   POC Methamphetamine UR None Detected NONE DETECTED (Cut Off Level 1000 ng/mL)   POC Morphine None Detected NONE DETECTED (Cut Off Level 300 ng/mL)   POC Methadone UR None Detected NONE DETECTED (Cut Off Level 300 ng/mL)   POC Oxycodone UR  None Detected NONE DETECTED (Cut Off Level 100 ng/mL)   POC Marijuana UR None Detected NONE DETECTED (Cut Off Level 50 ng/mL)    Blood Alcohol level:  Lab Results  Component Value Date   [facility] <15 05/07/2024   ETH <10 08/25/2020    Metabolic Disorder Labs:  Lab Results  Component Value Date   HGBA1C 5.5 05/07/2024   MPG 111 05/07/2024   No results found for: PROLACTIN Lab Results  Component Value Date   CHOL 216 (H) 05/07/2024   TRIG 71 05/07/2024   HDL 49 05/07/2024   CHOLHDL 4.4 05/07/2024   VLDL 14 05/07/2024   LDLCALC 153 (H) 05/07/2024    Current Medications: Current Facility-Administered Medications  Medication Dose Route Frequency Provider Last Rate Last Admin   acetaminophen  (TYLENOL ) tablet 650 mg  650 mg Oral Q6H PRN White, Patrice L, NP       alum & mag hydroxide-simeth (MAALOX/MYLANTA) 200-200-20 MG/5ML suspension 30 mL  30 mL Oral Q4H PRN White, Patrice L, NP       amLODipine  (NORVASC ) tablet  5 mg  5 mg Oral Daily White, Patrice L, NP   5 mg at 05/08/24 0848   [START ON 05/09/2024] buPROPion  (WELLBUTRIN  XL) 24 hr tablet 150 mg  150 mg Oral q morning Josafat Enrico, MD       clonazePAM  (KLONOPIN ) tablet 0.5 mg  0.5 mg Oral BID Toshika Parrow, MD       haloperidol  (HALDOL ) tablet 5 mg  5 mg Oral TID PRN White, Patrice L, NP       And   diphenhydrAMINE  (BENADRYL ) capsule 50 mg  50 mg Oral TID PRN White, Patrice L, NP       haloperidol  lactate (HALDOL ) injection 5 mg  5 mg Intramuscular TID PRN White, Patrice L, NP       And   diphenhydrAMINE  (BENADRYL ) injection 50 mg  50 mg Intramuscular TID PRN White, Patrice L, NP       And   LORazepam  (ATIVAN ) injection 2 mg  2 mg Intramuscular TID PRN White, Patrice L, NP       haloperidol  lactate (HALDOL ) injection 10 mg  10 mg Intramuscular TID PRN White, Patrice L, NP       And   diphenhydrAMINE  (BENADRYL ) injection 50 mg  50 mg Intramuscular TID PRN White, Patrice L, NP       And   LORazepam  (ATIVAN ) injection 2 mg  2 mg  Intramuscular TID PRN White, Patrice L, NP       hydrOXYzine  (ATARAX ) tablet 25 mg  25 mg Oral TID PRN White, Patrice L, NP   25 mg at 05/07/24 2305   lamoTRIgine  (LAMICTAL  XR) 24 hour tablet 50 mg  50 mg Oral QHS White, Patrice L, NP       magnesium  hydroxide (MILK OF MAGNESIA) suspension 30 mL  30 mL Oral Daily PRN White, Patrice L, NP       metFORMIN  (GLUCOPHAGE ) tablet 500 mg  500 mg Oral Q breakfast White, Patrice L, NP   500 mg at 05/08/24 0847   paliperidone  (INVEGA ) 24 hr tablet 3 mg  3 mg Oral Daily White, Patrice L, NP   3 mg at 05/08/24 0847   sertraline  (ZOLOFT ) tablet 50 mg  50 mg Oral Daily Bobetta Korf, MD       traZODone  (DESYREL ) tablet 50 mg  50 mg Oral QHS PRN White, Patrice L, NP   50 mg at 05/07/24 2305   PTA Medications: Medications Prior to Admission  Medication Sig Dispense Refill Last Dose/Taking   amLODipine  (NORVASC ) 5 MG tablet Take 5 mg by mouth daily.      buPROPion  (WELLBUTRIN  XL) 300 MG 24 hr tablet Take 300 mg by mouth every morning.      cetirizine (ZYRTEC) 10 MG tablet Take 10 mg by mouth daily as needed for allergies.      clonazePAM  (KLONOPIN ) 0.5 MG tablet Take 1 tablet (0.5 mg total) by mouth 2 (two) times daily. 60 tablet 1    hydrOXYzine  (VISTARIL ) 50 MG capsule Take 50 mg by mouth 3 (three) times daily as needed for anxiety.      lamoTRIgine  (LAMICTAL  XR) 50 MG 24 hour tablet Take 50 mg by mouth at bedtime.      metFORMIN  (GLUCOPHAGE ) 500 MG tablet Take 500 mg by mouth daily.      paliperidone  (INVEGA ) 3 MG 24 hr tablet Take 1 tablet (3 mg total) by mouth daily. (Vargas taking differently: Take 3 mg by mouth daily as needed.) 30 tablet 1  Paliperidone  ER (INVEGA  SUSTENNA) injection Inject 117 mg into Scott muscle every 30 (thirty) days. 0.75 mL 2    tirzepatide (MOUNJARO) 2.5 MG/0.5ML Pen Inject 2.5 mg into Scott skin once a week. (Vargas not taking: Reported on 05/07/2024)      triamcinolone  cream (KENALOG ) 0.1 % Apply 1 Application topically daily as  needed (Apply to affected area).       AIMS:  ,  ,  ,  ,  ,  ,    Musculoskeletal: Strength & Muscle Tone: within normal limits Gait & Station: normal Vargas leans: N/A    Psychiatric Specialty Exam:  Appearance:  Scott Vargas, appearing stated age,  wearing appropriate to Scott situation hospital clothes, with fair grooming and hygiene. Normal level of alertness and appropriate facial expression.  Attitude/Behavior: calm, cooperative, engaging with appropriate eye contact.  Motor: WNL; dyskinesias not evident. Gait appears in full range.  Speech: spontaneous, clear, coherent, normal comprehension.  Mood: euthymic,  I am fine and good to go .  Affect: appropriately-reactive, full range.  Thought process: Vargas appears coherent, organized, logical, goal-directed, associations are appropriate.  Thought content: Vargas denies suicidal thoughts, denies homicidal thoughts; did not express any delusions.  Thought perception: Vargas denies auditory and visual hallucinations. Did not appear internally stimulated.  Cognition: Vargas is alert and oriented in self, place, date; with intact attention and concentration.  Insight: limited, in regards of understanding of presence, nature, cause, and significance of mental or emotional problem.  Judgement: fair, in regards of ability to make good decisions concerning Scott appropriate thing to do in various situations, including ability to form opinions regarding their mental health condition.  Physical Exam: Physical Exam Constitutional:      Appearance: Normal appearance.  HENT:     Head: Normocephalic and atraumatic.  Eyes:     Extraocular Movements: Extraocular movements intact.     Pupils: Pupils are equal, round, and reactive to light.  Cardiovascular:     Rate and Rhythm: Normal rate and regular rhythm.  Pulmonary:     Effort: Pulmonary effort is normal.  Abdominal:     General: Abdomen is flat.  Musculoskeletal:     Cervical  back: Normal range of motion.  Skin:    General: Skin is warm and dry.  Neurological:     General: No focal deficit present.     Mental Status: Scott Vargas is alert and oriented to person, place, and time.  Psychiatric:        Mood and Affect: Mood normal.        Behavior: Behavior normal.    Review of Systems  Constitutional:  Negative for chills and fever.  HENT:  Negative for hearing loss.   Eyes:  Negative for blurred vision.  Respiratory:  Negative for cough.   Cardiovascular:  Negative for chest pain.  Gastrointestinal:  Negative for abdominal pain, nausea and vomiting.  Skin:  Negative for rash.  Neurological:  Negative for dizziness, tremors and headaches.  Psychiatric/Behavioral:  Positive for depression. Negative for hallucinations and suicidal ideas. Scott Vargas is nervous/anxious. Scott Vargas does not have insomnia.    Blood pressure 124/82, pulse 79, temperature 97.7 F (36.5 C), temperature source Oral, resp. rate 16, height 5' 10 (1.778 m), weight 89.4 kg, SpO2 97%. Body mass index is 28.27 kg/m.  ____________________________________________________________________________________________ Assessment: Scott Vargas, Scott Vargas, is a 28 year old male with a known history of schizoaffective disorder and alcohol use disorder who presented with escalating violent and intrusive thoughts, including homicidal ideation toward  family members, his pastor, and specific demographic groups. Scott Vargas was brought [facility] voluntarily by family due to concern over worsening symptoms, including mood instability, racing thoughts, insomnia, and internal aggressive ideation.  At presentation, Scott Vargas denied current suicidal ideation and active homicidal intent, and no signs of acute psychosis were observed on evaluation. Scott Vargas was admitted for psychiatric stabilization due to risk associated with intrusive violent ideation. At Scott time of this writing, Scott Vargas currently  denies any homicidal ideation.  Impression: Schizoaffective disorder Generalized anxiety disorder  Plan: -inpatient psychiatric admission will be continued. Vargas will be closely monitored for changes in mood, thought content, and safety during admission. -Vargas will be integrated in Scott milieu.   -Vargas will be encouraged to attend groups.   -15-minute checks;  -daily contact with Vargas to assess and evaluate symptoms and progress in treatment;  -psychoeducation -vital signs: q12 hours.  -precautions: suicide, elopement, and assault.   -Psychotropic medications were continued with some adjustments:        Invega  117 mg IM every 4 weeks (last administered May 05, 2024) - consider to increase Scott next dose of LAI.        increase Invega  to 6mg  orally at bedtime - Scott Vargas reports a worsening over Scott past 3 months of violent and intrusive thoughts, mood swings, racing thoughts, and insomnia -- all of which may signal poor control of underlying schizoaffective disorder.        restart Klonopin  0.5 mg twice daily for anxiety (PDMP reviewed)        decrease Wellbutrin  to 150 mg daily - given Scott Vargas's recent worsening of mood symptoms, including increased anger, racing thoughts, and escalating violent and intrusive ideation.       continue Lamictal  50 mg at bedtime       start Zoloft  50mg  PO daily - to better manage depressive symptoms and intrusive thoughts without increasing Scott risk of agitation or psychosis.     -We will attempt to collect collateral information.  -Disposition will be determined after Scott Vargas is stabilized. -Social work and case management to assist with discharge planning and identification of hospital follow-up needs prior to discharge -Estimated LOS: 3-5 days -Discharge Concerns: Need to establish a safety plan; Medication compliance and effectiveness -Discharge Goals: Return home with outpatient referrals for mental health follow-up  including medication management/psychotherapy ____________________________________________________________________________________________   Treatment Plan Summary: Daily contact with Vargas to assess and evaluate symptoms and progress in treatment and Medication management  Observation Level/Precautions:  15 minute checks  Laboratory:     Psychotherapy:    Medications:    Consultations:    Discharge Concerns:    Estimated LOS:  Other:     Physician Treatment Plan for Primary Diagnosis: Schizoaffective disorder (HCC) Long Term Goal(s): Improvement in symptoms so as ready for discharge  Short Term Goals: Ability to identify changes in lifestyle to reduce recurrence of condition will improve, Ability to verbalize feelings will improve, Ability to disclose and discuss suicidal ideas, Ability to demonstrate self-control will improve, Ability to identify and develop effective coping behaviors will improve, Ability to maintain clinical measurements within normal limits will improve, Compliance with prescribed medications will improve, and Ability to identify triggers associated with substance abuse/mental health issues will improve  Physician Treatment Plan for Secondary Diagnosis: Principal Problem:   Schizoaffective disorder (HCC)  Long Term Goal(s): Improvement in symptoms so as ready for discharge  Short Term Goals: Ability to identify changes in lifestyle  to reduce recurrence of condition will improve, Ability to verbalize feelings will improve, Ability to disclose and discuss suicidal ideas, Ability to demonstrate self-control will improve, Ability to identify and develop effective coping behaviors will improve, Ability to maintain clinical measurements within normal limits will improve, Compliance with prescribed medications will improve, Ability to identify triggers associated with substance abuse/mental health issues will improve, and    I certify that inpatient services furnished can  reasonably be expected to improve Scott Vargas's condition.    Neil Appl, MD 7/12/20259:52 AM

## 2024-05-08 NOTE — [facility] Group Notes (Signed)
 LCSW Wellness Group Note   05/08/2024 2:00pm  Type of Group and Topic: Psychoeducational Group:  Wellness  Participation Level:  did not attend  Description of Group  Wellness group introduces the topic and its focus on developing healthy habits across the spectrum and its relationship to a decrease in hospital admissions.  Six areas of wellness are discussed: physical, social spiritual, intellectual, occupational, and emotional.  Patients are asked to consider their current wellness habits and to identify areas of wellness where they are interested and able to focus on improvements.    Therapeutic Goals Patients will understand components of wellness and how they can positively impact overall health.  Patients will identify areas of wellness where they have developed good habits. Patients will identify areas of wellness where they would like to make improvements.    Summary of Patient Progress     Therapeutic Modalities: Cognitive Behavioral Therapy Psychoeducation    Bridget Cordella Simmonds, LCSW

## 2024-05-08 NOTE — Progress Notes (Signed)
 Patient ID: Scott Vargas, male   DOB: 1996-06-19, 28 y.o.   MRN: [id number]  Admission Note:  D:28 yr male who presents [facility] in no acute distress for the treatment of HI and Depression. Pt appears flat and depressed. Pt was calm and cooperative with admission process.Pt denies SI/ HI/ AVH at this time. Pt appeared to have an entitled attitude towards writer during the admission.   Per Assessment:  past psychiatric history significant for schizoaffective disorder and alcohol use disorder who presented to the [facility] voluntary accompanied by his parents and his twin brother with complaints of violent thoughts towards others. Patient states that he woke up in the middle of the night last night and was having thoughts to get a kitchen knife and stab his family. He states that he has been having  recent thoughts to kill his family and the pastor because he grew up in a Christian and strict home. He states that he believes in a higher power but is no longer Christian. He also reports that he has been having violent racing thoughts towards black man between the ages of [age] as he is leaning towards White supremacy lately. He states that his grandfather is racist and he has been living with him and that he has developed these thoughts on his own. He states that for the past 3 months his mood and the violent thoughts have been getting worse. He reports experiencing mood swings of anger, racing thoughts and struggling to sleep. He states that he feels like his appetite has increased over the past 2 months since restarting the Invega  injection. He denies suicidal thoughts. He denies past suicide attempts but reports suicidal thoughts years ago to shoot himself. He denies auditory or visual hallucinations. Objectively, no signs of acute psychosis. He denies physical aggression towards others and states that it's more internal thoughts. He states that for the past couple years he has been  having thoughts and desires that he is bisexual. He reports occasional alcohol use, on average he drinks 3-4 times per week, a half case of beer. He reports occasional marijuana use 2 months ago and states that he has not used recently. He reports outpatient psychiatry at [facility] and is prescribed Invega  117 mg every 4 weeks, last administered on Wednesday, May 05, 2024, Invega  3 mg po as needed, Klonopin  0.5 mg BID (last filled on 04/05/24 x 30 days), hydroxyzine  as needed, Wellbutrin  300 mg daily, and Lamictal  50 mg at bedtime. He reports counseling with [facility]. He reports a medical history of diabetes type 2 and hypertension and states that he is prescribed metformin  (unknown dose) daily and Mounjaro as needed and amlodipine  5 mg daily. He resides with his mother, father and twin brother. He is currently unemployed and states that he lost his job at [employer] in April when his mental health started to spiral out of control. He states that he was kicked out of his parent's house at that time and was sent to live with his grandfather in [place]. He states that three weeks ago he was hospitalized in [facility] because he was having thoughts to kill people and was only there for 3 days. He states that he has been hospitalized last at least 5 times in the past.   A: Skin was assessed and found to be clear of any abnormal marks . PT searched and no contraband found, POC and unit policies explained and understanding verbalized. Consents obtained. Food  and fluids offered, and  accepted.   R:Pt had no additional questions or concerns.
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
## 2024-05-09 DIAGNOSIS — F250 Schizoaffective disorder, bipolar type: Secondary | ICD-10-CM | POA: Diagnosis not present

## 2024-05-09 LAB — GLUCOSE, CAPILLARY: Glucose-Capillary: 89 mg/dL (ref 70–99)

## 2024-05-09 MED ORDER — ARIPIPRAZOLE 5 MG PO TABS
5.0000 mg | ORAL_TABLET | Freq: Every day | ORAL | Status: DC
  Administered 2024-05-09 – 2024-05-10 (×2): 5 mg via ORAL
  Filled 2024-05-09 (×2): qty 1

## 2024-05-09 NOTE — Plan of Care (Signed)
 ?  Problem: Education: ?Goal: Mental status will improve ?Outcome: Progressing ?Goal: Verbalization of understanding the information provided will improve ?Outcome: Progressing ?  ?

## 2024-05-09 NOTE — [facility] Group Notes (Signed)
 Psychoeducational Group Note  Date:  05/09/2024 Time:  2000  Group Topic/Focus:  Wrap up group  Participation Level: Did Not Attend  Participation Quality:  Not Applicable  Affect:  Not Applicable  Cognitive:  Not Applicable  Insight:  Not Applicable  Engagement in Group: Not Applicable  Additional Comments:  Did not attend.   Lenora Manuelita RAMAN 05/09/2024, 8:41 PM

## 2024-05-09 NOTE — Progress Notes (Signed)
   05/09/24 2000  Psych Admission Type (Psych Patients Only)  Admission Status Voluntary  Psychosocial Assessment  Patient Complaints None  Eye Contact Fair  Facial Expression Flat  Affect Preoccupied  Speech Logical/coherent  Interaction Isolative  Motor Activity Other (Comment) (WNL)  Appearance/Hygiene Disheveled  Behavior Characteristics Appropriate to situation  Mood Empty  Thought Process  Coherency WDL  Content WDL  Delusions None reported or observed  Perception WDL  Hallucination None reported or observed  Judgment Limited  Confusion None  Danger to Self  Current suicidal ideation?  (Denies)  Agreement Not to Harm Self Yes  Description of Agreement Notify Staff  Danger to Others  Danger to Others None reported or observed   Pt A/O x4 with noted Schizoaffective Disorder/HI passive. Able to verbalize needs. In bed during group. Pt encouraged to attend group. Pt refused. Med whole. Tolerated all PM meds well. Pt denied SI/HI; A/V/H. No noted distress. Monitor continues during 7p-7a shift.

## 2024-05-09 NOTE — Progress Notes (Signed)
   05/09/24 0920  Psych Admission Type (Psych Patients Only)  Admission Status Voluntary  Psychosocial Assessment  Patient Complaints None  Eye Contact Fair  Facial Expression Flat  Affect Blunted  Speech Logical/coherent  Interaction Assertive  Motor Activity Slow  Appearance/Hygiene Unremarkable  Behavior Characteristics Cooperative  Mood Depressed  Thought Process  Coherency WDL  Content WDL  Delusions None reported or observed  Perception WDL  Hallucination None reported or observed  Judgment Limited  Confusion None  Danger to Self  Current suicidal ideation? Denies  Agreement Not to Harm Self Yes  Description of Agreement verbal  Danger to Others  Danger to Others None reported or observed  Danger to Others Abnormal  Harmful Behavior to others No threats or harm toward other people  Destructive Behavior No threats or harm toward property
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
## 2024-05-09 NOTE — Progress Notes (Signed)
   05/08/24 2202  Psych Admission Type (Psych Patients Only)  Admission Status Voluntary  Psychosocial Assessment  Patient Complaints None  Eye Contact Fair  Facial Expression Flat  Affect Blunted  Speech Logical/coherent  Interaction Minimal  Motor Activity Other (Comment) (WDL)  Appearance/Hygiene Unremarkable  Behavior Characteristics Cooperative  Mood Depressed  Thought Process  Coherency WDL  Content WDL  Delusions None reported or observed  Perception WDL  Hallucination None reported or observed  Judgment Impaired  Confusion None  Danger to Self  Current suicidal ideation? Denies  Danger to Others  Danger to Others None reported or observed  Danger to Others Abnormal  Harmful Behavior to others No threats or harm toward other people  Destructive Behavior No threats or harm toward property

## 2024-05-10 ENCOUNTER — Encounter (HOSPITAL_COMMUNITY): Payer: Self-pay

## 2024-05-10 DIAGNOSIS — F200 Paranoid schizophrenia: Principal | ICD-10-CM

## 2024-05-10 MED ORDER — SERTRALINE HCL 25 MG PO TABS
25.0000 mg | ORAL_TABLET | Freq: Every day | ORAL | Status: DC
  Administered 2024-05-11 – 2024-05-16 (×6): 25 mg via ORAL
  Filled 2024-05-10 (×6): qty 1

## 2024-05-10 MED ORDER — ARIPIPRAZOLE 10 MG PO TABS
10.0000 mg | ORAL_TABLET | Freq: Every day | ORAL | Status: DC
  Administered 2024-05-11 – 2024-05-12 (×2): 10 mg via ORAL
  Filled 2024-05-10 (×2): qty 1

## 2024-05-10 NOTE — Progress Notes (Addendum)
 [facility] MD Progress Note  05/10/2024 1:52 PM Scott Vargas  MRN:  [id number] Subjective:   28 year old Caucasian male, single, unemployed, lives with his family.  Background history of schizophrenia.  Presented in company of his family on account of worsening psychosis.  Reported to have voiced violent thoughts towards his family and towards his past.  Has expressed thoughts of stabbing them.  Reported to routinely dig up graves in the backyard and then covered him off.  Reported to be preoccupied with religion and white supremacy.  Reported to have expressed violent thoughts towards black people age 46 to 29 years. Deteriorated after he weaned himself off Invega  which caused him to gain weight. Routine labs are essentially normal.  No alcohol and psychoactive substance on board.  I assumed care of this patient today.  Chart reviewed.  Patient discussed at multidisciplinary team meeting.  Nursing staff reports that patient has been adherent with his medication.  No PRNs required.  No challenging behavior on the unit.  He slept for 9.25 hours.  No observed response to internal stimuli.  At interview with patient, he was very vague and minimized the issues that led to his admission.  He is more focused on how soon he is going to be discharged.  He acknowledged losing his job as he was not getting along with people.  He acknowledged being kicked out by his parents initially, spending time with his grandfather in [place] .  Patient is dismissive of any current violent thoughts.  He rationalized digging graves as a hobby is always had.  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  Patient is tolerating aripiprazole  well.  States that he has not had any adverse effects from it.  I noticed he was shuffling his feet but patient states that he has always done that subconsciously. Patient is agreeable to optimization in dose of aripiprazole .  He would think through [facility] if the oral  medication helps. Encouraged to keep ventilating his feelings to staff.  Principal Problem: Paranoid schizophrenia (HCC) Diagnosis: Principal Problem:   Paranoid schizophrenia (HCC)  Total Time spent with patient: 30 minutes  Past Psychiatric History:  Diagnoses: Schizoaffective disorder. Alcohol use disorder Outpatient Psychiatry: currently followed by [facility] Engaged in counseling with [facility]izations: Hospitalized at least 5 times in the past. Most recent hospitalization: 3 weeks ago in [place] for homicidal ideation (3-day stay). No past suicide attempts. Reports passive suicidal thoughts years ago (e.g., thoughts of shooting himself)  Current Psychiatric Medications: Invega  117 mg IM every 4 weeks (last dose: 05/05/24) Invega  3 mg PO as needed Klonopin  0.5 mg BID (last filled: 04/05/24, 30-day supply) Hydroxyzine  as needed Wellbutrin  300 mg daily Lamictal  50 mg at bedtime  Past Medications: Risperidone , Invega .   Past Medical History:  Past Medical History:  Diagnosis Date   Diabetes mellitus without complication (HCC)    Migraine    History reviewed. No pertinent surgical history. Family History: History reviewed. No pertinent family history. Family Psychiatric  History:  See H&P  Social History:  Social History   Substance and Sexual Activity  Alcohol Use Yes     Social History   Substance and Sexual Activity  Drug Use Yes   Types: Marijuana    Social History   Socioeconomic History   Marital status: Single    Spouse name: Not on file   Number of children: Not on file   Years of education: Not on file  Highest education level: Not on file  Occupational History   Not on file  Tobacco Use   Smoking status: Light Smoker    Types: Cigars   Smokeless tobacco: Never  Substance and Sexual Activity   Alcohol use: Yes   Drug use: Yes    Types: Marijuana   Sexual activity: Not Currently    Birth  control/protection: None  Other Topics Concern   Not on file  Social History Narrative   Not on file   Social Drivers of Health   Financial Resource Strain: Medium Risk (10/30/2020)   Received from [facility]   Overall Financial Resource Strain (CARDIA)    Difficulty of Paying Living Expenses: Somewhat hard  Food Insecurity: No Food Insecurity (05/08/2024)   Hunger Vital Sign    Worried About Running Out of Food in the Last Year: Never true    Ran Out of Food in the Last Year: Never true  Transportation Needs: No Transportation Needs (05/08/2024)   PRAPARE - Administrator, Civil Service (Medical): No    Lack of Transportation (Non-Medical): No  Physical Activity: Not on file  Stress: Stress Concern Present (10/30/2020)   Received from [facility] - Occupational Stress Questionnaire    Feeling of Stress : To some extent  Social Connections: Unknown (03/10/2022)   Received from [facility]   Social Network    Social Network: Not on file    Current Medications: Current Facility-Administered Medications  Medication Dose Route Frequency Provider Last Rate Last Admin   acetaminophen  (TYLENOL ) tablet 650 mg  650 mg Oral Q6H PRN White, Patrice L, NP       alum & mag hydroxide-simeth (MAALOX/MYLANTA) 200-200-20 MG/5ML suspension 30 mL  30 mL Oral Q4H PRN White, Patrice L, NP       amLODipine  (NORVASC ) tablet 5 mg  5 mg Oral Daily White, Patrice L, NP   5 mg at 05/10/24 9173   ARIPiprazole  (ABILIFY ) tablet 5 mg  5 mg Oral Daily Paliy, Alisa, MD   5 mg at 05/10/24 9172   buPROPion  (WELLBUTRIN  XL) 24 hr tablet 150 mg  150 mg Oral q morning Paliy, Alisa, MD   150 mg at 05/10/24 1134   clonazePAM  (KLONOPIN ) tablet 0.5 mg  0.5 mg Oral BID Paliy, Alisa, MD   0.5 mg at 05/10/24 0827   haloperidol  (HALDOL ) tablet 5 mg  5 mg Oral TID PRN White, Patrice L, NP       And   diphenhydrAMINE  (BENADRYL ) capsule 50 mg  50 mg Oral TID PRN White,  Patrice L, NP       haloperidol  lactate (HALDOL ) injection 5 mg  5 mg Intramuscular TID PRN White, Patrice L, NP       And   diphenhydrAMINE  (BENADRYL ) injection 50 mg  50 mg Intramuscular TID PRN White, Patrice L, NP       And   LORazepam  (ATIVAN ) injection 2 mg  2 mg Intramuscular TID PRN White, Patrice L, NP       haloperidol  lactate (HALDOL ) injection 10 mg  10 mg Intramuscular TID PRN White, Patrice L, NP       And   diphenhydrAMINE  (BENADRYL ) injection 50 mg  50 mg Intramuscular TID PRN White, Patrice L, NP       And   LORazepam  (ATIVAN ) injection 2 mg  2 mg Intramuscular TID PRN White, Patrice L, NP       hydrOXYzine  (ATARAX ) tablet  25 mg  25 mg Oral TID PRN White, Patrice L, NP   25 mg at 05/07/24 2305   lamoTRIgine  (LAMICTAL  XR) 24 hour tablet 50 mg  50 mg Oral QHS White, Patrice L, NP   50 mg at 05/09/24 2121   magnesium  hydroxide (MILK OF MAGNESIA) suspension 30 mL  30 mL Oral Daily PRN White, Patrice L, NP       metFORMIN  (GLUCOPHAGE ) tablet 500 mg  500 mg Oral Q breakfast White, Patrice L, NP   500 mg at 05/10/24 0827   oxymetazoline  (AFRIN) 0.05 % nasal spray 1 spray  1 spray Each Nare BID Paliy, Alisa, MD   1 spray at 05/10/24 0828   sertraline  (ZOLOFT ) tablet 50 mg  50 mg Oral Daily Paliy, Alisa, MD   50 mg at 05/10/24 0827   traZODone  (DESYREL ) tablet 50 mg  50 mg Oral QHS PRN White, Patrice L, NP   50 mg at 05/07/24 2305    Lab Results:  Results for orders placed or performed during the hospital encounter of 05/07/24 (from the past 48 hours)  Glucose, capillary     Status: None   Collection Time: 05/09/24  3:45 PM  Result Value Ref Range   Glucose-Capillary 89 70 - 99 mg/dL    Comment: Glucose reference range applies only to samples taken after fasting for at least 8 hours.    Blood Alcohol level:  Lab Results  Component Value Date   [facility] <15 05/07/2024   ETH <10 08/25/2020    Metabolic Disorder Labs: Lab Results  Component Value Date   HGBA1C 5.5 05/07/2024    MPG 111 05/07/2024   No results found for: PROLACTIN Lab Results  Component Value Date   CHOL 216 (H) 05/07/2024   TRIG 71 05/07/2024   HDL 49 05/07/2024   CHOLHDL 4.4 05/07/2024   VLDL 14 05/07/2024   LDLCALC 153 (H) 05/07/2024    Physical Findings: AIMS:  ,  ,  ,  ,  ,  ,   CIWA:    COWS:     Musculoskeletal: Strength & Muscle Tone: within normal limits Gait & Station: normal Patient leans: N/A  Psychiatric Specialty Exam:  Presentation  General Appearance and behavior:  Casually dressed, slow, shuffling his feet throughout.  Eye Contact: Good.  Speech: Spontaneous.  Not pressured, not loud.  Mood and Affect  Mood: Subjectively feels better.  Affect: Restricted.  Thought Process  Thought Processes: Linear and goal directed.  Descriptions of Associations:Intact  Orientation:Full (Time, Place and Person)  Thought Content: No current suicidal thoughts.  No current homicidal thoughts.  No thoughts of violence.  No negative ruminative flooding.  No guilty ruminations.  No delusional theme.  No obsessions.  Hallucinations: No hallucination in any modality.  Sensorium  Memory: Good.  Judgment: Fair.  Insight: Good  Executive Functions  Concentration: Good.  Attention Span: Good.  Recall: Good.  Fund of Knowledge: Good.  Language: Good   Psychomotor Activity  Normal psychomotor activity    Physical Exam: Physical Exam ROS Blood pressure 132/81, pulse 68, temperature 97.8 F (36.6 C), temperature source Oral, resp. rate 16, height 5' 10 (1.778 m), weight 89.4 kg, SpO2 100%. Body mass index is 28.27 kg/m.   Treatment Plan Summary: Patient presented with psychotic symptoms which was precipitated by discontinuation of Invega .  There are no associated mood symptoms.  Picture more in keeping with schizophrenia paranoid type.  He is tolerating aripiprazole  well.  We will optimize towards antipsychotic  dose.  We will adjust his  medication as below and evaluate him further.  1.  Increase aripiprazole  to 10 mg daily.  Will titrate as needed/tolerated. 2.  Decrease sertraline  to 25 mg daily.  We will likely wean off with time. 3.  Continue clonazepam  0.5 mg twice daily.  The 4.  Continue bupropion  150 mg daily. 5.  Continue to monitor mood behavior and interaction with others. 6.  Continue to encourage unit groups and therapeutic activities. 7.  Social worker will get feedback/collateral from family. 8.  Social worker will coordinate discharge and aftercare planning.  Scott DELENA Forehand, MD 05/10/2024, 1:52 PM

## 2024-05-10 NOTE — [facility] Group Notes (Signed)
 Psychoeducational Group Note  Date:  05/10/2024 Time:  2000  Group Topic/Focus:  Wrap up group  Participation Level: Did Not Attend  Participation Quality:  Not Applicable  Affect:  Not Applicable  Cognitive:  Not Applicable  Insight:  Not Applicable  Engagement in Group: Not Applicable  Additional Comments:  Did not attend.   Lenora Manuelita RAMAN 05/10/2024, 8:38 PM

## 2024-05-10 NOTE — Plan of Care (Signed)
  Problem: Activity: Goal: Interest or engagement in activities will improve Outcome: Progressing   Problem: Coping: Goal: Ability to verbalize frustrations and anger appropriately will improve Outcome: Progressing   Problem: Education: Goal: Knowledge of [facility] Education information/materials will improve Outcome: Progressing

## 2024-05-10 NOTE — Progress Notes (Signed)
   05/10/24 2015  Psych Admission Type (Psych Patients Only)  Admission Status Voluntary  Psychosocial Assessment  Patient Complaints Anxiety  Eye Contact Fair  Facial Expression Flat  Affect Preoccupied  Speech Logical/coherent  Interaction Isolative  Motor Activity Slow  Appearance/Hygiene Disheveled  Behavior Characteristics Cooperative  Mood Suspicious;Preoccupied  Aggressive Behavior  Effect No apparent injury  Thought Process  Coherency WDL  Content WDL  Delusions WDL  Perception WDL  Hallucination None reported or observed  Judgment Limited  Confusion WDL  Danger to Self  Current suicidal ideation? Denies  Danger to Others  Danger to Others None reported or observed  Danger to Others Abnormal  Harmful Behavior to others No threats or harm toward other people

## 2024-05-10 NOTE — Plan of Care (Signed)
  Problem: Education: Goal: Knowledge of [facility] Education information/materials will improve Outcome: Progressing Goal: Emotional status will improve Outcome: Progressing Goal: Mental status will improve Outcome: Progressing Goal: Verbalization of understanding the information provided will improve Outcome: Progressing   Problem: Activity: Goal: Interest or engagement in activities will improve Outcome: Progressing Goal: Sleeping patterns will improve Outcome: Progressing   Problem: Coping: Goal: Ability to demonstrate self-control will improve Outcome: Progressing   Problem: Health Behavior/Discharge Planning: Goal: Identification of resources available to assist in meeting health care needs will improve Outcome: Progressing Goal: Compliance with treatment plan for underlying cause of condition will improve Outcome: Progressing

## 2024-05-10 NOTE — BH IP Treatment Plan (Signed)
 Interdisciplinary Treatment and Diagnostic Plan Update  05/10/2024 Time of Session: 1109 Scott Vargas MRN: [id number]  Principal Diagnosis: Paranoid schizophrenia [facility])  Secondary Diagnoses: Principal Problem:   Paranoid schizophrenia (HCC)   Current Medications:  Current Facility-Administered Medications  Medication Dose Route Frequency Provider Last Rate Last Admin   acetaminophen  (TYLENOL ) tablet 650 mg  650 mg Oral Q6H PRN White, Patrice L, NP       alum & mag hydroxide-simeth (MAALOX/MYLANTA) 200-200-20 MG/5ML suspension 30 mL  30 mL Oral Q4H PRN White, Patrice L, NP       amLODipine  (NORVASC ) tablet 5 mg  5 mg Oral Daily White, Patrice L, NP   5 mg at 05/10/24 0826   [START ON 05/11/2024] ARIPiprazole  (ABILIFY ) tablet 10 mg  10 mg Oral Daily Izediuno, Jerrell LABOR, MD       clonazePAM  (KLONOPIN ) tablet 0.5 mg  0.5 mg Oral BID Paliy, Alisa, MD   0.5 mg at 05/10/24 0827   haloperidol  (HALDOL ) tablet 5 mg  5 mg Oral TID PRN White, Patrice L, NP       And   diphenhydrAMINE  (BENADRYL ) capsule 50 mg  50 mg Oral TID PRN White, Patrice L, NP       haloperidol  lactate (HALDOL ) injection 5 mg  5 mg Intramuscular TID PRN White, Patrice L, NP       And   diphenhydrAMINE  (BENADRYL ) injection 50 mg  50 mg Intramuscular TID PRN White, Patrice L, NP       And   LORazepam  (ATIVAN ) injection 2 mg  2 mg Intramuscular TID PRN White, Patrice L, NP       haloperidol  lactate (HALDOL ) injection 10 mg  10 mg Intramuscular TID PRN White, Patrice L, NP       And   diphenhydrAMINE  (BENADRYL ) injection 50 mg  50 mg Intramuscular TID PRN White, Patrice L, NP       And   LORazepam  (ATIVAN ) injection 2 mg  2 mg Intramuscular TID PRN White, Patrice L, NP       hydrOXYzine  (ATARAX ) tablet 25 mg  25 mg Oral TID PRN White, Patrice L, NP   25 mg at 05/10/24 1447   lamoTRIgine  (LAMICTAL  XR) 24 hour tablet 50 mg  50 mg Oral QHS White, Patrice L, NP   50 mg at 05/09/24 2121   magnesium  hydroxide (MILK OF MAGNESIA)  suspension 30 mL  30 mL Oral Daily PRN White, Patrice L, NP       metFORMIN  (GLUCOPHAGE ) tablet 500 mg  500 mg Oral Q breakfast White, Patrice L, NP   500 mg at 05/10/24 0827   oxymetazoline  (AFRIN) 0.05 % nasal spray 1 spray  1 spray Each Nare BID Paliy, Alisa, MD   1 spray at 05/10/24 0828   [START ON 05/11/2024] sertraline  (ZOLOFT ) tablet 25 mg  25 mg Oral Daily Izediuno, Vincent A, MD       traZODone  (DESYREL ) tablet 50 mg  50 mg Oral QHS PRN White, Patrice L, NP   50 mg at 05/07/24 2305   PTA Medications: Medications Prior to Admission  Medication Sig Dispense Refill Last Dose/Taking   amLODipine  (NORVASC ) 5 MG tablet Take 5 mg by mouth daily.      buPROPion  (WELLBUTRIN  XL) 300 MG 24 hr tablet Take 300 mg by mouth every morning.      cetirizine (ZYRTEC) 10 MG tablet Take 10 mg by mouth daily as needed for allergies.      clonazePAM  (KLONOPIN ) 0.5 MG tablet  Take 1 tablet (0.5 mg total) by mouth 2 (two) times daily. 60 tablet 1    hydrOXYzine  (VISTARIL ) 50 MG capsule Take 50 mg by mouth 3 (three) times daily as needed for anxiety.      lamoTRIgine  (LAMICTAL  XR) 50 MG 24 hour tablet Take 50 mg by mouth at bedtime.      metFORMIN  (GLUCOPHAGE ) 500 MG tablet Take 500 mg by mouth daily.      paliperidone  (INVEGA ) 3 MG 24 hr tablet Take 1 tablet (3 mg total) by mouth daily. (Patient taking differently: Take 3 mg by mouth daily as needed.) 30 tablet 1    Paliperidone  ER (INVEGA  SUSTENNA) injection Inject 117 mg into the muscle every 30 (thirty) days. 0.75 mL 2    tirzepatide (MOUNJARO) 2.5 MG/0.5ML Pen Inject 2.5 mg into the skin once a week. (Patient not taking: Reported on 05/07/2024)      triamcinolone  cream (KENALOG ) 0.1 % Apply 1 Application topically daily as needed (Apply to affected area).       Patient Stressors: Marital or family conflict   Medication change or noncompliance   Occupational concerns    Patient Strengths: Automotive engineer for treatment/growth   Supportive family/friends   Treatment Modalities: Medication Management, Group therapy, Case management,  1 to 1 session with clinician, Psychoeducation, Recreational therapy.   Physician Treatment Plan for Primary Diagnosis: Paranoid schizophrenia (HCC) Long Term Goal(s): Improvement in symptoms so as ready for discharge   Short Term Goals: Ability to identify changes in lifestyle to reduce recurrence of condition will improve Ability to verbalize feelings will improve Ability to disclose and discuss suicidal ideas Ability to demonstrate self-control will improve Ability to identify and develop effective coping behaviors will improve Ability to maintain clinical measurements within normal limits will improve Compliance with prescribed medications will improve Ability to identify triggers associated with substance abuse/mental health issues will improve  Medication Management: Evaluate patient's response, side effects, and tolerance of medication regimen.  Therapeutic Interventions: 1 to 1 sessions, Unit Group sessions and Medication administration.  Evaluation of Outcomes: Not Progressing  Physician Treatment Plan for Secondary Diagnosis: Principal Problem:   Paranoid schizophrenia (HCC)  Long Term Goal(s): Improvement in symptoms so as ready for discharge   Short Term Goals: Ability to identify changes in lifestyle to reduce recurrence of condition will improve Ability to verbalize feelings will improve Ability to disclose and discuss suicidal ideas Ability to demonstrate self-control will improve Ability to identify and develop effective coping behaviors will improve Ability to maintain clinical measurements within normal limits will improve Compliance with prescribed medications will improve Ability to identify triggers associated with substance abuse/mental health issues will improve     Medication Management: Evaluate patient's response, side effects, and tolerance of  medication regimen.  Therapeutic Interventions: 1 to 1 sessions, Unit Group sessions and Medication administration.  Evaluation of Outcomes: Not Progressing   RN Treatment Plan for Primary Diagnosis: Paranoid schizophrenia (HCC) Long Term Goal(s): Knowledge of disease and therapeutic regimen to maintain health will improve  Short Term Goals: Ability to remain free from injury will improve, Ability to verbalize frustration and anger appropriately will improve, Ability to demonstrate self-control, Ability to participate in decision making will improve, Ability to verbalize feelings will improve, Ability to disclose and discuss suicidal ideas, Ability to identify and develop effective coping behaviors will improve, Compliance with prescribed medications will improve, and    Medication Management: RN will administer medications as ordered by provider, will assess  and evaluate patient's response and provide education to patient for prescribed medication. RN will report any adverse and/or side effects to prescribing provider.  Therapeutic Interventions: 1 on 1 counseling sessions, Psychoeducation, Medication administration, Evaluate responses to treatment, Monitor vital signs and CBGs as ordered, Perform/monitor CIWA, COWS, AIMS and Fall Risk screenings as ordered, Perform wound care treatments as ordered.  Evaluation of Outcomes: Not Progressing   LCSW Treatment Plan for Primary Diagnosis: Paranoid schizophrenia (HCC) Long Term Goal(s): Safe transition to appropriate next level of care at discharge, Engage patient in therapeutic group addressing interpersonal concerns.  Short Term Goals: Engage patient in aftercare planning with referrals and resources, Increase social support, Increase ability to appropriately verbalize feelings, Increase emotional regulation, Facilitate acceptance of mental health diagnosis and concerns, Facilitate patient progression through stages of change regarding substance  use diagnoses and concerns, Identify triggers associated with mental health/substance abuse issues, and Increase skills for wellness and recovery  Therapeutic Interventions: Assess for all discharge needs, 1 to 1 time with Social worker, Explore available resources and support systems, Assess for adequacy in community support network, Educate family and significant other(s) on suicide prevention, Complete Psychosocial Assessment, Interpersonal group therapy.  Evaluation of Outcomes: Not Progressing   Progress in Treatment: Attending groups: Yes. Participating in groups: Yes. Taking medication as prescribed: Yes. Toleration medication: Yes. Family/Significant other contact made: No, will contact:  Enrique Nham (father) [phone] Patient understands diagnosis: Yes. Discussing patient identified problems/goals with staff: Yes. Medical problems stabilized or resolved: Yes. Denies suicidal/homicidal ideation: Yes. Issues/concerns per patient self-inventory: No. Other: n/a  New problem(s) identified: No, Describe:  None   New Short Term/Long Term Goal(s): medication stabilization, elimination of SI thoughts, development of comprehensive mental wellness plan.   Patient Goals:  work on my agitation  Discharge Plan or Barriers: Patient recently admitted. CSW will continue to follow and assess for appropriate referrals and possible discharge planning.   Reason for Continuation of Hospitalization: Anxiety Medication stabilization Other; describe mood stabilization, discharge planning   Estimated Length of Stay: 3-5 DAYS  Last 3 [place] Suicide Severity Risk Score: Flowsheet Row [facility]Current[facility] Most recent reading at 05/07/2024 10:46 PM ED from 05/07/2024 in [facility] Most recent reading at 05/07/2024  1:00 PM  C-SSRS RISK CATEGORY No Risk No Risk    Last PHQ 2/9 Scores:     No data  to display          Scribe for Treatment Team: Eydan Chianese N Breyson Kelm, LCSW 05/10/2024 4:29 PM

## 2024-05-10 NOTE — Group Note (Signed)
 Recreation Therapy Group Note   Group Topic:Problem Solving  Group Date: 05/10/2024 Start Time: 0930 End Time: 1000 Facilitators: Matyas Baisley-McCall, LRT,CTRS Location: [address] Day[address]   Group Topic: Problem Solving  Goal Area(s) Addresses:  Patient will effectively work in a team with other group members. Patient will verbalize importance of using appropriate problem solving techniques.  Patient will identify positive change associated with effective problem solving skills.   Behavioral Response: Minimal  Intervention: Worksheet, Music  Activity: Science writer. Patients were given a front and back worksheet of brain teasers. Patients worked together to figure out each individual puzzle presented on sheet. Patients had 20 minutes to complete the brain teasers. LRT went over the answers with patients at the end.    Education: Problem Solving, Communication, Cognitive Skills  Education Outcome: Acknowledges understanding/In group clarification offered/Needs additional education.    Affect/Mood: Appropriate   Participation Level: Minimal   Participation Quality: Independent   Behavior: On-looking   Speech/Thought Process: Relevant   Insight: None   Judgement: None   Modes of Intervention: Group work   Patient Response to Interventions:  Receptive   Education Outcome:  In group clarification offered    Clinical Observations/Individualized Feedback: Pt was quiet but attentive. Pt stated he felt he wasn't smart enough to complete the brain teasers. LRT tried to give pt a little encouragement to try to do the teasers. Pt mainly watched and observed as peers completed the activity.    Plan: Continue to engage patient in RT group sessions 2-3x/week.   Wahid Holley-McCall, LRT,CTRS 05/10/2024 12:55 PM

## 2024-05-10 NOTE — Group Note (Signed)
 Occupational Therapy Group Note  Group Topic: Sleep Hygiene  Group Date: 05/10/2024 Start Time: 1425 End Time: 1455 Facilitators: Dot [place] MATSU, OT   Group Description: Group encouraged increased participation and engagement through topic focused on sleep hygiene. Patients reflected on the quality of sleep they typically receive and identified areas that need improvement. Group was given background information on sleep and sleep hygiene, including common sleep disorders. Group members also received information on how to improve one's sleep and introduced a sleep diary as a tool that can be utilized to track sleep quality over a length of time. Group session ended with patients identifying one or more strategies they could utilize or implement into their sleep routine in order to improve overall sleep quality.        Therapeutic Goal(s):  Identify one or more strategies to improve overall sleep hygiene  Identify one or more areas of sleep that are negatively impacted (sleep too much, too little, etc)     Participation Level: Engaged   Participation Quality: Independent   Behavior: Appropriate   Speech/Thought Process: Relevant   Affect/Mood: Appropriate   Insight: Fair   Judgement: Fair      Modes of Intervention: Education  Patient Response to Interventions:  Attentive   Plan: Continue to engage patient in OT groups 2 - 3x/week.  05/10/2024  [place] MATSU Dot, OT  Scott Vargas, OT

## 2024-05-10 NOTE — Plan of Care (Signed)
  Problem: Activity: Goal: Sleeping patterns will improve Outcome: Progressing   Problem: Activity: Goal: Interest or engagement in activities will improve Outcome: Not Progressing   

## 2024-05-10 NOTE — Progress Notes (Signed)
   05/10/24 0800  Psych Admission Type (Psych Patients Only)  Admission Status Voluntary  Psychosocial Assessment  Patient Complaints Anxiety  Eye Contact Fair  Facial Expression Flat  Affect Preoccupied  Speech Logical/coherent  Interaction Isolative  Motor Activity Other (Comment)  Appearance/Hygiene  (WDL)  Behavior Characteristics Cooperative;Appropriate to situation  Thought Process  Coherency WDL  Content WDL  Delusions None reported or observed  Perception WDL  Hallucination None reported or observed  Judgment Limited  Confusion None  Danger to Self  Current suicidal ideation? Denies  Agreement Not to Harm Self Yes  Description of Agreement Verbal  Danger to Others  Danger to Others None reported or observed

## 2024-05-11 NOTE — Progress Notes (Signed)
   05/11/24 1000  Psych Admission Type (Psych Patients Only)  Admission Status Voluntary  Psychosocial Assessment  Patient Complaints Anxiety;Depression  Eye Contact Fair  Facial Expression Flat  Affect Preoccupied  Speech Logical/coherent  Interaction Isolative;Minimal  Motor Activity Slow  Appearance/Hygiene Disheveled  Behavior Characteristics Cooperative  Mood Preoccupied;Suspicious  Thought Process  Coherency WDL  Content WDL  Delusions None reported or observed  Perception WDL  Hallucination None reported or observed  Judgment Limited  Confusion None  Danger to Self  Current suicidal ideation? Denies  Agreement Not to Harm Self Yes  Description of Agreement verbal  Danger to Others  Danger to Others None reported or observed  Danger to Others Abnormal  Harmful Behavior to others No threats or harm toward other people

## 2024-05-11 NOTE — Group Note (Signed)
 LCSW Group Therapy Note   Group Date: 05/11/2024 Start Time: 1100 End Time: 1200   Participation:  patient was present  Type of Therapy:  Group Therapy  Topic:  Healing Hearts: A Safe Space for Grief  Objective:  The objective of this group, Healing Hearts: A Safe Space for Grief, is to create a compassionate environment where participants can process their grief, explore different stages of grief, and discover ways to honor their loved ones through personal rituals.  3 Goals: Provide a safe and supportive space where participants feel comfortable sharing their feelings and experiences of grief without judgment. Educate participants about the stages of grief and emphasize that there is no right way to grieve or a fixed timeline for healing. Introduce the concept of rituals as a means to process grief, allowing individuals to honor their loved ones in a personal and meaningful way.  Summary:  In Healing Hearts: A Safe Space for Grief, we explored the unique and personal journey of grief, emphasizing that everyone experiences it differently. We discussed the five stages of grief (denial, anger, bargaining, depression, and acceptance), with the understanding that grief is not linear. Rituals were introduced as a way to help cope with loss, offering comfort and connection through meaningful actions such as lighting candles or taking memory walks. Participants were encouraged to express their emotions, focus on self-care, and reflect on moments of gratitude for their loved ones, recognizing that healing is a process and there is no timeline for grief.   Tangy Drozdowski O Korah Hufstedler, LCSWA 05/11/2024  5:30 PM

## 2024-05-11 NOTE — BH Assessment (Incomplete Revision)
(  Sleep Hours) - (Any PRNs that were needed, meds refused, or side effects to meds)- Vistaril  at HS. (Any disturbances and when (visitation, over night)-None occurred 7p-7a shift. (Concerns raised by the patient)- Provider D/C Wellbutrin . Pt req to re-start. Pt said; The med gave me energy. (SI/HI/AVH)- Pt denied SI/HI; V/H but confirmed AH

## 2024-05-11 NOTE — Progress Notes (Signed)
 Adult Psychoeducational Group Note  Date:  05/11/2024 Time:  9:55 AM  Group Topic/Focus:  Goals Group:   The focus of this group is to help patients establish daily goals to achieve during treatment and discuss how the patient can incorporate goal setting into their daily lives to aide in recovery.  Participation Level:  Active  Participation Quality:  Appropriate  Affect:  Appropriate  Cognitive:  Appropriate  Insight: Appropriate  Engagement in Group:  Engaged  Modes of Intervention:  Discussion  Additional Comments:  Pt stated he is feeling calm.  Pt goal for the day is to get some rest.  Scott Vargas 05/11/2024, 9:55 AM

## 2024-05-11 NOTE — Group Note (Signed)
 Recreation Therapy Group Note   Group Topic:Communication  Group Date: 05/11/2024 Start Time: 0947 End Time: 1025 Facilitators: Rika Daughdrill-McCall, LRT,CTRS Location: [address] Dayroom   Group Topic: Communication, Problem Solving   Goal Area(s) Addresses:  Patient will effectively listen to complete activity.  Patient will identify communication skills used to make activity successful.  Patient will identify how skills used during activity can be used to reach post d/c goals.    Behavioral Response: None   Intervention: Building surveyor Activity - Geometric pattern cards, pencils, blank paper    Activity: Geometric Drawings.  Three volunteers from the peer group will be shown an abstract picture with a particular arrangement of geometrical shapes.  Each round, one 'speaker' will describe the pattern, as accurately as possible without revealing the image to the group.  The remaining group members will listen and draw the picture to reflect how it is described to them. Patients with the role of 'listener' cannot ask clarifying questions but, may request that the speaker repeat a direction. Once the drawings are complete, the presenter will show the rest of the group the picture and compare how close each person came to drawing the picture. LRT will facilitate a post-activity discussion regarding effective communication and the importance of planning, listening, and asking for clarification in daily interactions with others.  Education: Environmental consultant, Active listening, Support systems, Discharge planning  Education Outcome: Acknowledges understanding/In group clarification offered/Needs additional education.    Affect/Mood: Appropriate   Participation Level: None   Participation Quality: Independent   Behavior: Disinterested   Speech/Thought Process: None   Insight: None   Judgement: None   Modes of Intervention: Activity   Patient Response to Interventions:   Disengaged   Education Outcome:  In group clarification offered    Clinical Observations/Individualized Feedback: Pt attended group for a short period before leaving and not returning.     Plan: Continue to engage patient in RT group sessions 2-3x/week.   Scott Vargas, LRT,CTRS  05/11/2024 1:01 PM

## 2024-05-11 NOTE — BHH Group Notes (Signed)
Patient did not attend the Wrap-up group. 

## 2024-05-11 NOTE — Progress Notes (Signed)
 [facility] MD Progress Note  05/11/2024 11:15 AM Scott Vargas  MRN:  [id number] Subjective:   28 year old Caucasian male, single, unemployed, lives with his family.  Background history of schizophrenia.  Presented in company of his family on account of worsening psychosis.  Reported to have voiced violent thoughts towards his family and towards his past.  Has expressed thoughts of stabbing them.  Reported to routinely dig up graves in the backyard and then covered him off.  Reported to be preoccupied with religion and white supremacy.  Reported to have expressed violent thoughts towards black people age 57 to 29 years. Deteriorated after he weaned himself off Invega  which caused him to gain weight. Routine labs are essentially normal.  No alcohol and psychoactive substance on board.  Chart reviewed.  Patient discussed at multidisciplinary team meeting.  Nursing staff reports that patient has been adherent with his medication.  No PRNs required lately.  He slept for 7 hours.  Not participating with unit groups and therapeutic activities.  No observed response to internal stimuli.  Seen today.  Patient has tolerated recent adjustments made.  He tells me that he feels relatively calmer today.  He is not endorsing any adverse effects.  He states that the voices are less intense.  He did not volunteer any delusions today.  He is focused on how soon he will get back home.  States that he has been in communication with his parents.  Further exploration of his social history indicates that he has struggled to keep a job.  He lost his last job after 3 months.  He has also struggled to maintain sustained romantic relationship.  His last relationship was over 2 years ago.  He has few friends.  We have agreed to optimize aripiprazole .  We will hopefully transition to long-acting injectable if he maintains progress.  Principal Problem: Paranoid schizophrenia (HCC) Diagnosis: Principal Problem:   Paranoid  schizophrenia (HCC)  Total Time spent with patient: 30 minutes  Past Psychiatric History:  Diagnoses: Schizoaffective disorder. Alcohol use disorder Outpatient Psychiatry: currently followed by [facility] Engaged in counseling with [facility] Hospitalizations: Hospitalized at least 5 times in the past. Most recent hospitalization: 3 weeks ago in [place] for homicidal ideation (3-day stay). No past suicide attempts. Reports passive suicidal thoughts years ago (e.g., thoughts of shooting himself)  Current Psychiatric Medications: Invega  117 mg IM every 4 weeks (last dose: 05/05/24) Invega  3 mg PO as needed Klonopin  0.5 mg BID (last filled: 04/05/24, 30-day supply) Hydroxyzine  as needed Wellbutrin  300 mg daily Lamictal  50 mg at bedtime  Past Medications: Risperidone , Invega .   Past Medical History:  Past Medical History:  Diagnosis Date   Diabetes mellitus without complication (HCC)    Migraine    History reviewed. No pertinent surgical history. Family History: History reviewed. No pertinent family history. Family Psychiatric  History:  See H&P  Social History:  Social History   Substance and Sexual Activity  Alcohol Use Yes     Social History   Substance and Sexual Activity  Drug Use Yes   Types: Marijuana    Social History   Socioeconomic History   Marital status: Single    Spouse name: Not on file   Number of children: Not on file   Years of education: Not on file   Highest education level: Not on file  Occupational History   Not on file  Tobacco Use   Smoking status: Light Smoker    Types: Cigars  Smokeless tobacco: Never  Substance and Sexual Activity   Alcohol use: Yes   Drug use: Yes    Types: Marijuana   Sexual activity: Not Currently    Birth control/protection: None  Other Topics Concern   Not on file  Social History Narrative   Not on file   Social Drivers of Health   Financial Resource Strain: Medium  Risk (10/30/2020)   Received from [facility]   Overall Financial Resource Strain (CARDIA)    Difficulty of Paying Living Expenses: Somewhat hard  Food Insecurity: No Food Insecurity (05/08/2024)   Hunger Vital Sign    Worried About Running Out of Food in the Last Year: Never true    Ran Out of Food in the Last Year: Never true  Transportation Needs: No Transportation Needs (05/08/2024)   PRAPARE - Administrator, Civil Service (Medical): No    Lack of Transportation (Non-Medical): No  Physical Activity: Not on file  Stress: Stress Concern Present (10/30/2020)   Received from [facility] - Occupational Stress Questionnaire    Feeling of Stress : To some extent  Social Connections: Unknown (03/10/2022)   Received from [facility]   Social Network    Social Network: Not on file    Current Medications: Current Facility-Administered Medications  Medication Dose Route Frequency Provider Last Rate Last Admin   acetaminophen  (TYLENOL ) tablet 650 mg  650 mg Oral Q6H PRN White, Patrice L, NP       alum & mag hydroxide-simeth (MAALOX/MYLANTA) 200-200-20 MG/5ML suspension 30 mL  30 mL Oral Q4H PRN White, Patrice L, NP       amLODipine  (NORVASC ) tablet 5 mg  5 mg Oral Daily White, Patrice L, NP   5 mg at 05/11/24 0850   ARIPiprazole  (ABILIFY ) tablet 10 mg  10 mg Oral Daily Calee Nugent, Jerrell LABOR, MD   10 mg at 05/11/24 0850   clonazePAM  (KLONOPIN ) tablet 0.5 mg  0.5 mg Oral BID Paliy, Alisa, MD   0.5 mg at 05/11/24 0850   haloperidol  (HALDOL ) tablet 5 mg  5 mg Oral TID PRN White, Patrice L, NP       And   diphenhydrAMINE  (BENADRYL ) capsule 50 mg  50 mg Oral TID PRN White, Patrice L, NP       haloperidol  lactate (HALDOL ) injection 5 mg  5 mg Intramuscular TID PRN White, Patrice L, NP       And   diphenhydrAMINE  (BENADRYL ) injection 50 mg  50 mg Intramuscular TID PRN White, Patrice L, NP       And   LORazepam  (ATIVAN ) injection 2 mg  2 mg  Intramuscular TID PRN White, Patrice L, NP       haloperidol  lactate (HALDOL ) injection 10 mg  10 mg Intramuscular TID PRN White, Patrice L, NP       And   diphenhydrAMINE  (BENADRYL ) injection 50 mg  50 mg Intramuscular TID PRN White, Patrice L, NP       And   LORazepam  (ATIVAN ) injection 2 mg  2 mg Intramuscular TID PRN White, Patrice L, NP       hydrOXYzine  (ATARAX ) tablet 25 mg  25 mg Oral TID PRN White, Patrice L, NP   25 mg at 05/10/24 2117   lamoTRIgine  (LAMICTAL  XR) 24 hour tablet 50 mg  50 mg Oral QHS White, Patrice L, NP   50 mg at 05/10/24 2116   magnesium  hydroxide (MILK OF MAGNESIA) suspension 30 mL  30 mL Oral Daily PRN White, Patrice L, NP       metFORMIN  (GLUCOPHAGE ) tablet 500 mg  500 mg Oral Q breakfast White, Patrice L, NP   500 mg at 05/11/24 0850   sertraline  (ZOLOFT ) tablet 25 mg  25 mg Oral Daily Lekeith Wulf A, MD   25 mg at 05/11/24 0850   traZODone  (DESYREL ) tablet 50 mg  50 mg Oral QHS PRN White, Patrice L, NP   50 mg at 05/07/24 2305    Lab Results:  Results for orders placed or performed during the hospital encounter of 05/07/24 (from the past 48 hours)  Glucose, capillary     Status: None   Collection Time: 05/09/24  3:45 PM  Result Value Ref Range   Glucose-Capillary 89 70 - 99 mg/dL    Comment: Glucose reference range applies only to samples taken after fasting for at least 8 hours.    Blood Alcohol level:  Lab Results  Component Value Date   [facility] <15 05/07/2024   ETH <10 08/25/2020    Metabolic Disorder Labs: Lab Results  Component Value Date   HGBA1C 5.5 05/07/2024   MPG 111 05/07/2024   No results found for: PROLACTIN Lab Results  Component Value Date   CHOL 216 (H) 05/07/2024   TRIG 71 05/07/2024   HDL 49 05/07/2024   CHOLHDL 4.4 05/07/2024   VLDL 14 05/07/2024   LDLCALC 153 (H) 05/07/2024    Physical Findings: AIMS:  ,  ,  ,  ,  ,  ,   CIWA:    COWS:     Musculoskeletal: Strength & Muscle Tone: within normal limits Gait &  Station: normal Patient leans: N/A  Psychiatric Specialty Exam:  Presentation  General Appearance and behavior:  Casually dressed, calm and cooperative.  No EPS.  Eye Contact: Good.  Speech: Spontaneous.  Not pressured, not loud.  Mood and Affect  Mood: Subjectively feels better.  Affect: Restricted.  Thought Process  Thought Processes: Linear and goal directed.  Descriptions of Associations:Intact  Orientation:Full (Time, Place and Person)  Thought Content: No current suicidal thoughts.  No current homicidal thoughts.  No thoughts of violence.  No negative ruminative flooding.  No guilty ruminations.  No delusional theme.  No obsessions.  Hallucinations: Decreasing auditory hallucination.  Sensorium  Memory: Good.  Judgment: Fair.  Insight: Good  Executive Functions  Concentration: Good.  Attention Span: Good.  Recall: Good.  Fund of Knowledge: Good.  Language: Good   Psychomotor Activity  Normal psychomotor activity    Physical Exam: Physical Exam ROS Blood pressure (!) 150/79, pulse 72, temperature 98 F (36.7 C), temperature source Oral, resp. rate 16, height 5' 10 (1.778 m), weight 89.4 kg, SpO2 99%. Body mass index is 28.27 kg/m.   Treatment Plan Summary: Patient has tolerated recent introduction of aripiprazole  well.  He is responding appropriately to treatment.  We will make further adjustments as below.  We will hopefully transition to a longer acting injectable if he maintains progress.  1.  Increase Aripiprazole  to 20 mg daily.  Will titrate as needed/tolerated. 2.  Decrease Sertraline  to 25 mg daily.  We will likely wean off with time. 3.  Continue Clonazepam  0.5 mg twice daily.  The 4.  Discontinue Bupropion  150 mg daily. 5.  Continue to monitor mood behavior and interaction with others. 6.  Continue to encourage unit groups and therapeutic activities. 7.  Social worker will get feedback/collateral from family. 8.   Social worker will coordinate  discharge and aftercare planning.  Jerrell DELENA Forehand, MD 05/11/2024, 11:15 AM

## 2024-05-11 NOTE — BHH Group Notes (Addendum)

## 2024-05-11 NOTE — BH Assessment (Signed)
(  Sleep Hours) - (Any PRNs that were needed, meds refused, or side effects to meds)- Vistaril  at HS. (Any disturbances and when (visitation, over night)-None occurred 7p-7a shift. (Concerns raised by the patient)- Provider D/C Wellbutrin . Pt req to re-start. Pt said; The med gave me energy. (SI/HI/AVH)- Pt denied SI/HI; V/H but confirmed AH

## 2024-05-11 NOTE — Plan of Care (Signed)
   Problem: Education: Goal: Emotional status will improve Outcome: Progressing Goal: Verbalization of understanding the information provided will improve Outcome: Progressing   Problem: Activity: Goal: Sleeping patterns will improve Outcome: Progressing   Problem: Coping: Goal: Ability to demonstrate self-control will improve Outcome: Progressing   Problem: Health Behavior/Discharge Planning: Goal: Compliance with treatment plan for underlying cause of condition will improve Outcome: Progressing   Problem: Safety: Goal: Periods of time without injury will increase Outcome: Progressing

## 2024-05-11 NOTE — Progress Notes (Signed)
   05/11/24 2140  Psych Admission Type (Psych Patients Only)  Admission Status Voluntary  Psychosocial Assessment  Patient Complaints Isolation  Eye Contact Fair  Facial Expression Flat  Affect Preoccupied  Speech Logical/coherent  Interaction Isolative  Motor Activity Other (Comment) (WNL)  Appearance/Hygiene Disheveled;Poor hygiene  Behavior Characteristics Appropriate to situation  Mood Preoccupied  Thought Process  Coherency WDL  Content WDL  Delusions None reported or observed  Perception WDL  Hallucination None reported or observed  Judgment Poor  Confusion None  Danger to Self  Current suicidal ideation?  (Denies)  Agreement Not to Harm Self Yes  Description of Agreement Notify Staff  Danger to Others Abnormal  Harmful Behavior to others No threats or harm toward other people

## 2024-05-12 ENCOUNTER — Other Ambulatory Visit (HOSPITAL_COMMUNITY): Payer: Self-pay

## 2024-05-12 ENCOUNTER — Ambulatory Visit: Admitting: Mental Health

## 2024-05-12 LAB — GLUCOSE, CAPILLARY: Glucose-Capillary: 119 mg/dL — ABNORMAL HIGH (ref 70–99)

## 2024-05-12 MED ORDER — ARIPIPRAZOLE ER 400 MG IM SRER
400.0000 mg | INTRAMUSCULAR | Status: DC
  Administered 2024-05-13: 400 mg via INTRAMUSCULAR

## 2024-05-12 MED ORDER — CLONAZEPAM 0.5 MG PO TABS
0.5000 mg | ORAL_TABLET | Freq: Every day | ORAL | Status: DC
  Administered 2024-05-13: 0.5 mg via ORAL
  Filled 2024-05-12: qty 1

## 2024-05-12 MED ORDER — ARIPIPRAZOLE 5 MG PO TABS
5.0000 mg | ORAL_TABLET | Freq: Once | ORAL | Status: AC
  Administered 2024-05-12: 5 mg via ORAL
  Filled 2024-05-12: qty 1

## 2024-05-12 MED ORDER — ARIPIPRAZOLE 10 MG PO TABS
20.0000 mg | ORAL_TABLET | Freq: Every day | ORAL | Status: DC
  Administered 2024-05-13 – 2024-05-16 (×4): 20 mg via ORAL
  Filled 2024-05-12 (×4): qty 2

## 2024-05-12 MED ORDER — TRIAMCINOLONE ACETONIDE 0.1 % EX CREA
TOPICAL_CREAM | Freq: Two times a day (BID) | CUTANEOUS | Status: DC
  Filled 2024-05-12: qty 15

## 2024-05-12 MED ORDER — ARIPIPRAZOLE ER 400 MG IM SRER: 400.0000 mg | INTRAMUSCULAR | Status: DC

## 2024-05-12 MED ORDER — TRIAMCINOLONE ACETONIDE 0.1 % EX LOTN
TOPICAL_LOTION | Freq: Two times a day (BID) | CUTANEOUS | Status: DC
  Filled 2024-05-12: qty 60

## 2024-05-12 NOTE — [facility] Suicide Risk Assessment (Signed)
 [facility] INPATIENT:  Family/Significant Other Suicide Prevention Education  Suicide Prevention Education:  Education Completed; Enrique Zalesky (father) [phone],  (name of family member/significant other) has been identified by the patient as the family member/significant other with whom the patient will be residing, and identified as the person(s) who will aid the patient in the event of a mental health crisis (suicidal ideations/suicide attempt).  With written consent from the patient, the family member/significant other has been provided the following suicide prevention education, prior to the and/or following the discharge of the patient.  Father reports himself or pt's mother will come this evening to visit with patient to gauge progress in treatment. Pt does not have access to weapons or firearms in the home. Pt's parents are committed to securing kitchen knives and sharp objects prior to patient's discharge.  There are concerns of allowing patient to return home unless they feel 100% certain pt will not harm either mother or father as this was the reason for admission. Father is requesting a call from the MD to understand medications further. Message sent to MD.   The suicide prevention education provided includes the following: Suicide risk factors Suicide prevention and interventions National Suicide Hotline telephone number [facility] assessment telephone number [facility] Emergency Assistance 911 [facility] and/or Residential Mobile Crisis Unit telephone number  Request made of family/significant other to: Remove weapons (e.g., guns, rifles, knives), all items previously/currently identified as safety concern.   Remove drugs/medications (over-the-counter, prescriptions, illicit drugs), all items previously/currently identified as a safety concern.  The family member/significant other verbalizes understanding of the suicide prevention education information  provided.  The family member/significant other agrees to remove the items of safety concern listed above.  Jenkins LULLA Primer 05/12/2024, 3:11 PM

## 2024-05-12 NOTE — Plan of Care (Signed)

## 2024-05-12 NOTE — Group Note (Signed)
 Date:  05/12/2024 Time:  9:10 PM  Group Topic/Focus:  Wrap-Up Group:   The focus of this group is to help patients review their daily goal of treatment and discuss progress on daily workbooks.    Participation Level:  Active  Participation Quality:  Appropriate and Sharing  Affect:  Appropriate  Cognitive:  Appropriate  Insight: Appropriate  Engagement in Group:  Distracting and Engaged  Modes of Intervention:  Activity and Socialization  Additional Comments:  Patients used Wrap Up Group Sheets. Patient rated his day a 5 out 10 with 1 being the worst day you've ever had. Patient goal for today, rest and yes patient did achieve goal. Coping skills the patient finds most helpful. meditation. Something the patient likes about himself, being happy when mental health is good. Patient did not have any questions or concerns.   Eward Mace 05/12/2024, 9:10 PM

## 2024-05-12 NOTE — Progress Notes (Signed)
   05/12/24 1003  Psych Admission Type (Psych Patients Only)  Admission Status Voluntary  Psychosocial Assessment  Patient Complaints None  Eye Contact Fair  Facial Expression Flat  Affect Appropriate to circumstance  Speech Logical/coherent  Interaction Assertive  Motor Activity Other (Comment) (Appropriate)  Appearance/Hygiene Unremarkable  Behavior Characteristics Appropriate to situation  Mood Anxious  Thought Process  Coherency WDL  Content WDL  Delusions None reported or observed  Perception WDL  Hallucination None reported or observed  Judgment Impaired  Confusion None  Danger to Self  Current suicidal ideation? Denies  Agreement Not to Harm Self Yes  Description of Agreement Verbal  Danger to Others  Danger to Others None reported or observed  Danger to Others Abnormal  Harmful Behavior to others No threats or harm toward other people  Destructive Behavior No threats or harm toward property

## 2024-05-12 NOTE — Progress Notes (Signed)
 [facility] MD Progress Note  05/12/2024 11:56 AM Scott Vargas  MRN:  [id number] Subjective:   28 year old Caucasian male, single, unemployed, lives with his family.  Background history of schizophrenia.  Presented in company of his family on account of worsening psychosis.  Reported to have voiced violent thoughts towards his family and towards his past.  Has expressed thoughts of stabbing them.  Reported to routinely dig up graves in the backyard and then covered him off.  Reported to be preoccupied with religion and white supremacy.  Reported to have expressed violent thoughts towards black people age 67 to 29 years. Deteriorated after he weaned himself off Invega  which caused him to gain weight. Routine labs are essentially normal.  No alcohol and psychoactive substance on board.  Chart reviewed.  Patient discussed at multidisciplinary team meeting.  Nursing staff reports that patient slept for 8.75 hours.  He has been adherent with his medications.  No challenging behavior.  No PRNs required.  He reports that his energy level is trending down.  Seen today.  Patient states that he is doing well with his medications.  Feels like bupropion  was energizing him.  I discussed his other medicines which are downers.  Lamotrigine  and clonazepam  not really desirable on a long-term basis.  He has agreed to gradual tapering of those two  medications.  Patient is not endorsing any delusions today.  He is not endorsing any hallucinations today.  He states that he has been in communication with his parents.  Patient is not endorsing any rageful thoughts towards them today.  No rageful thoughts towards others.  He looks forward to getting his long-acting injectable tomorrow.   Principal Problem: Paranoid schizophrenia (HCC) Diagnosis: Principal Problem:   Paranoid schizophrenia (HCC)  Total Time spent with patient: 30 minutes  Past Psychiatric History:  Diagnoses: Schizoaffective disorder. Alcohol use  disorder Outpatient Psychiatry: currently followed by [facility] Engaged in counseling with [facility]izations: Hospitalized at least 5 times in the past. Most recent hospitalization: 3 weeks ago in [place] for homicidal ideation (3-day stay). No past suicide attempts. Reports passive suicidal thoughts years ago (e.g., thoughts of shooting himself)  Current Psychiatric Medications: Invega  117 mg IM every 4 weeks (last dose: 05/05/24) Invega  3 mg PO as needed Klonopin  0.5 mg BID (last filled: 04/05/24, 30-day supply) Hydroxyzine  as needed Wellbutrin  300 mg daily Lamictal  50 mg at bedtime  Past Medications: Risperidone , Invega .   Past Medical History:  Past Medical History:  Diagnosis Date   Diabetes mellitus without complication (HCC)    Migraine    History reviewed. No pertinent surgical history. Family History: History reviewed. No pertinent family history. Family Psychiatric  History:  See H&P  Social History:  Social History   Substance and Sexual Activity  Alcohol Use Yes     Social History   Substance and Sexual Activity  Drug Use Yes   Types: Marijuana    Social History   Socioeconomic History   Marital status: Single    Spouse name: Not on file   Number of children: Not on file   Years of education: Not on file   Highest education level: Not on file  Occupational History   Not on file  Tobacco Use   Smoking status: Light Smoker    Types: Cigars   Smokeless tobacco: Never  Substance and Sexual Activity   Alcohol use: Yes   Drug use: Yes    Types: Marijuana   Sexual activity: Not Currently  Birth control/protection: None  Other Topics Concern   Not on file  Social History Narrative   Not on file   Social Drivers of Health   Financial Resource Strain: Medium Risk (10/30/2020)   [facility]   Overall Financial Resource Strain (CARDIA)    Difficulty of Paying Living Expenses: Somewhat  hard  Food Insecurity: No Food Insecurity (05/08/2024)   Hunger Vital Sign    Worried About Running Out of Food in the Last Year: Never true    Ran Out of Food in the Last Year: Never true  Transportation Needs: No Transportation Needs (05/08/2024)   PRAPARE - Administrator, Civil Service (Medical): No    Lack of Transportation (Non-Medical): No  Physical Activity: Not on file  Stress: Stress Concern Present (10/30/2020)   Received from [facility] - Occupational Stress Questionnaire    Feeling of Stress : To some extent  Social Connections: Unknown (03/10/2022)   Received from [facility]   Social Network    Social Network: Not on file    Current Medications: Current Facility-Administered Medications  Medication Dose Route Frequency Provider Last Rate Last Admin   acetaminophen  (TYLENOL ) tablet 650 mg  650 mg Oral Q6H PRN White, Patrice L, NP       alum & mag hydroxide-simeth (MAALOX/MYLANTA) 200-200-20 MG/5ML suspension 30 mL  30 mL Oral Q4H PRN White, Patrice L, NP       amLODipine  (NORVASC ) tablet 5 mg  5 mg Oral Daily White, Patrice L, NP   5 mg at 05/12/24 9081   ARIPiprazole  (ABILIFY ) tablet 10 mg  10 mg Oral Daily Maribell Demeo, Jerrell LABOR, MD   10 mg at 05/12/24 9081   clonazePAM  (KLONOPIN ) tablet 0.5 mg  0.5 mg Oral BID Paliy, Alisa, MD   0.5 mg at 05/12/24 9082   haloperidol  (HALDOL ) tablet 5 mg  5 mg Oral TID PRN White, Patrice L, NP       And   diphenhydrAMINE  (BENADRYL ) capsule 50 mg  50 mg Oral TID PRN White, Patrice L, NP       haloperidol  lactate (HALDOL ) injection 5 mg  5 mg Intramuscular TID PRN White, Patrice L, NP       And   diphenhydrAMINE  (BENADRYL ) injection 50 mg  50 mg Intramuscular TID PRN White, Patrice L, NP       And   LORazepam  (ATIVAN ) injection 2 mg  2 mg Intramuscular TID PRN White, Patrice L, NP       haloperidol  lactate (HALDOL ) injection 10 mg  10 mg Intramuscular TID PRN White, Patrice L, NP        And   diphenhydrAMINE  (BENADRYL ) injection 50 mg  50 mg Intramuscular TID PRN White, Patrice L, NP       And   LORazepam  (ATIVAN ) injection 2 mg  2 mg Intramuscular TID PRN White, Patrice L, NP       hydrOXYzine  (ATARAX ) tablet 25 mg  25 mg Oral TID PRN White, Patrice L, NP   25 mg at 05/11/24 2143   lamoTRIgine  (LAMICTAL  XR) 24 hour tablet 50 mg  50 mg Oral QHS White, Patrice L, NP   50 mg at 05/11/24 2143   magnesium  hydroxide (MILK OF MAGNESIA) suspension 30 mL  30 mL Oral Daily PRN White, Patrice L, NP       metFORMIN  (GLUCOPHAGE ) tablet 500 mg  500 mg Oral Q breakfast White, Wyline CROME, NP  500 mg at 05/12/24 9082   sertraline  (ZOLOFT ) tablet 25 mg  25 mg Oral Daily Akane Tessier A, MD   25 mg at 05/12/24 9081   traZODone  (DESYREL ) tablet 50 mg  50 mg Oral QHS PRN White, Patrice L, NP   50 mg at 05/07/24 2305    Lab Results:  Results for orders placed or performed during the hospital encounter of 05/07/24 (from the past 48 hours)  Glucose, capillary     Status: Abnormal   Collection Time: 05/12/24 11:30 AM  Result Value Ref Range   Glucose-Capillary 119 (H) 70 - 99 mg/dL    Comment: Glucose reference range applies only to samples taken after fasting for at least 8 hours.    Blood Alcohol level:  Lab Results  Component Value Date   [facility] <15 05/07/2024   ETH <10 08/25/2020    Metabolic Disorder Labs: Lab Results  Component Value Date   HGBA1C 5.5 05/07/2024   MPG 111 05/07/2024   No results found for: PROLACTIN Lab Results  Component Value Date   CHOL 216 (H) 05/07/2024   TRIG 71 05/07/2024   HDL 49 05/07/2024   CHOLHDL 4.4 05/07/2024   VLDL 14 05/07/2024   LDLCALC 153 (H) 05/07/2024    Physical Findings: AIMS:  ,  ,  ,  ,  ,  ,   CIWA:    COWS:     Musculoskeletal: Strength & Muscle Tone: within normal limits Gait & Station: normal Patient leans: N/A  Psychiatric Specialty Exam:  Presentation  General Appearance and behavior:  Casually dressed,  not in any distress, appropriate behavior.  No EPS.  Eye Contact: Good.  Speech: Spontaneous.  Normal rate, tone and volume.  Mood and Affect  Mood: Subjectively and objectively better.  Affect: Mobilizing affect appropriately.  Thought Process  Thought Processes: Linear and goal directed.  Descriptions of Associations:Intact  Orientation:Full (Time, Place and Person)  Thought Content: No current suicidal thoughts.  No current homicidal thoughts.  No thoughts of violence.  No negative ruminative flooding.  No guilty ruminations.  No delusional theme.  No obsessions.  Hallucinations: Decreasing auditory hallucination.  Sensorium  Memory: Good.  Judgment: Better.  Insight: Good  Executive Functions  Concentration: Good.  Attention Span: Good.  Recall: Good.  Fund of Knowledge: Good.  Language: Good   Psychomotor Activity  Normal psychomotor activity    Physical Exam: Physical Exam ROS Blood pressure (!) 130/91, pulse 77, temperature 98.2 F (36.8 C), temperature source Oral, resp. rate 16, height 5' 10 (1.778 m), weight 89.4 kg, SpO2 98%. Body mass index is 28.27 kg/m.   Treatment Plan Summary: Patient is gradually responding to treatment.  He is scheduled to get his long-acting injectable tomorrow.  We will adjust his medicine as below.  We will evaluate him further.  1.  Aripiprazole  to 20 mg daily.  Will titrate as needed/tolerated. 2.  Abilify  maintainer 400 mg tomorrow. 3.  Decrease Clonazepam  to 0.5 mg daily.   4.  Sertraline  25 mg daily. 5.  Lamotrigine  XL 50 mg daily. 6.  Continue to monitor mood behavior and interaction with others. 7.  Continue to encourage unit groups and therapeutic activities. 8.  Social worker will get feedback/collateral from family. 9.  Social worker will coordinate discharge and aftercare planning.  Jerrell DELENA Forehand, MD 05/12/2024, 11:56 AM

## 2024-05-12 NOTE — [facility] Group Notes (Signed)
 Spirituality group facilitated by Elia Rockie Sofia, BCC.  Group Description: Group focused on topic of hope. Patients participated in facilitated discussion around topic, connecting with one another around experiences and definitions for hope. Group members engaged with visual explorer photos, reflecting on what hope looks like for them today. Group engaged in discussion around how their definitions of hope are present today in hospital.  Modalities: Psycho-social ed, Adlerian, Narrative, MI  Patient Progress: Scott Vargas attended group and actively engaged and participated in group conversation and activities. He discussed how difficult it is to live with rage constantly. He both received and gave support to other patients as they shared.

## 2024-05-12 NOTE — Plan of Care (Signed)

## 2024-05-12 NOTE — Progress Notes (Signed)
  Scott Vargas   Type of Note: Materials engineer with patient and pt's father regarding insurance blocking MCD to cover pt's medications per pharmacy request in progression. Pt was given phone number for care manager for [facility].   Pt and father report pt's coverage through [facility]) was termed 03/27/24 but it can take up to 2 months for it to fall off when covering medications. Pt and father completing 3 way phone call with [employer] currently to update system again.  Father requesting phone call from pharmacy to discuss further.   Message sent to [facility].  Signed:  Zakeya Junker, LCSW-A 05/12/2024  3:56 PM

## 2024-05-12 NOTE — Group Note (Signed)
 Recreation Therapy Group Note   Group Topic:Team Building  Group Date: 05/12/2024 Start Time: 9062 End Time: 1015 Facilitators: Rishawn Walck-McCall, LRT,CTRS Location: [address] Dayroom   Group Topic: Communication, Team Building, Problem Solving  Goal Area(s) Addresses:  Patient will effectively work with peer towards shared goal.  Patient will identify skills used to make activity successful.  Patient will share challenges and verbalize solution-driven approaches used. Patient will identify how skills used during activity can be used to reach post d/c goals.   Behavioral Response: Moderate  Intervention: STEM Activity   Activity: [employer]. Patients were provided the following materials: 5 drinking straws, 5 rubber bands, 5 paper clips, 2 index cards and 2 drinking cups. Using the provided materials patients were asked to build a launching mechanism to launch a ping pong ball across the room, approximately 10 feet. Patients were divided into teams of 3-5. Instructions required all materials be incorporated into the device, functionality of items left to the peer group's discretion.  Education: Pharmacist, community, Scientist, physiological, Air cabin crew, Building control surveyor.   Education Outcome: Acknowledges education/In group clarification offered/Needs additional education.    Affect/Mood: Appropriate   Participation Level: Moderate   Participation Quality: Independent   Behavior: Appropriate   Speech/Thought Process: Focused   Insight: Moderate   Judgement: Moderate   Modes of Intervention: STEM Activity   Patient Response to Interventions:  Attentive   Education Outcome:  In group clarification offered    Clinical Observations/Individualized Feedback: Pt was offering ideas to his group on what/how they should build their launcher. Pt was attentive and focused as peers constructed launcher based on some of the suggestions he made.    Plan: Continue to engage patient  in RT group sessions 2-3x/week.   Uriah Philipson-McCall, LRT,CTRS 05/12/2024 1:08 PM

## 2024-05-13 MED ORDER — OXYMETAZOLINE HCL 0.05 % NA SOLN
1.0000 | Freq: Two times a day (BID) | NASAL | Status: DC
  Administered 2024-05-14 – 2024-05-15 (×2): 1 via NASAL

## 2024-05-13 NOTE — Progress Notes (Addendum)
 Shift Note   (Sleep Hours) - 7.5  (Any PRNs that were needed, meds refused, or side effects to meds)- Given PRN Trazodone  for insomnia, hydroxyzine  for anxiety   (Any disturbances and when (visitation, over night)- None  (Concerns raised by the patient)- Pt complaint of stuffy nose. He was made aware that his nasal spray ordered expired.   (SI/HI/AVH)- Denies SI/HI/AVH

## 2024-05-13 NOTE — Progress Notes (Signed)
 Patient presents with animated mood this morning stating he slept well last night. Patient denies SI,HI, and A/V/H with no plan or intent. Patient is med compliant and attended group. Patient remains cooperative in unit.
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
## 2024-05-13 NOTE — Progress Notes (Signed)
 Adult Psychoeducational Group Note  Date:  05/13/2024 Time:  1:05 PM  Group Topic/Focus:  Goals Group:   The focus of this group is to help patients establish daily goals to achieve during treatment and discuss how the patient can incorporate goal setting into their daily lives to aide in recovery.  Participation Level:  Active  Participation Quality:  Appropriate  Affect:  Appropriate  Cognitive:  Appropriate  Insight: Appropriate  Engagement in Group:  Engaged  Modes of Intervention:  Discussion  Additional Comments:  Pt stated he is feeling okay.  Pt goal for the day is to adjust to medications.  Daine Pillar D 05/13/2024, 1:05 PM

## 2024-05-13 NOTE — Group Note (Signed)
 LCSW Group Therapy Note   Group Date: 05/13/2024 Start Time: 1100 End Time: 1200   Participation:  patient was present.  He somewhat participated in the conversation.  Type of Therapy:  Group Therapy  Topic:   Healing Flames: Navigating Anger with Compassion  Objective:  Foster self-awareness and promote compassion toward oneself and others when dealing with anger.  Goals:  Help participants understand the underlying emotions and needs fueling anger. Provide coping strategies for healthier emotional expression and anger management.  Summary: This session explored anger as a volcano - an explosion driven by deeper feelings and unmet needs. Participants learned to identify anger triggers and underlying emotions, then practiced coping strategies like deep breathing, physical activity, and journaling. The group discussed healthy ways to manage anger before it escalates, using both personal reflection and shared experiences.  Therapeutic Modalities: Cognitive Behavioral Therapy (CBT): Challenging thoughts that fuel anger. Mindfulness: Increasing awareness of emotions and sensations.   Santanna Whitford O Debroah Shuttleworth, LCSWA 05/13/2024  6:14 PM

## 2024-05-13 NOTE — Plan of Care (Signed)
   Problem: Activity: Goal: Interest or engagement in activities will improve Outcome: Progressing   Problem: Health Behavior/Discharge Planning: Goal: Compliance with treatment plan for underlying cause of condition will improve Outcome: Progressing   Problem: Safety: Goal: Periods of time without injury will increase Outcome: Progressing

## 2024-05-14 ENCOUNTER — Encounter (HOSPITAL_COMMUNITY): Payer: Self-pay

## 2024-05-14 NOTE — Progress Notes (Signed)
 Patient denies SI,HI, and A/V/H with no plan or intent and states he is ready for discharge. Patient med compliant and observed socializing appropriately. Patient attended recreation and remains cooperative in unit.
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
## 2024-05-14 NOTE — Group Note (Signed)
 Date:  05/14/2024 Time:  5:10 AM  Group Topic/Focus:  Wrap-Up Group:   The focus of this group is to help patients review their daily goal of treatment and discuss progress on daily workbooks.    Additional Comments:  Pt was encouraged, but opted out of attending wrap up group this evening.   Scott Vargas 05/14/2024, 5:10 AM

## 2024-05-14 NOTE — Plan of Care (Signed)

## 2024-05-14 NOTE — Group Note (Signed)
 Date:  05/14/2024 Time:  10:14 AM  Group Topic/Focus:  Goals Group/Stages of changes:   The focus of this group is to help patients establish daily goals to achieve during treatment and discuss how the patient can incorporate goal setting into their daily lives to aide in recovery. Also discussed the stages of change they were in and how ready for change they are and what possible steps they could take to changing in the future     Participation Level:  Minimal  Participation Quality:  Sharing  Affect:  Flat  Cognitive:  Appropriate  Insight: Lacking  Engagement in Group:  Poor  Modes of Intervention:  Activity and Discussion  Additional Comments:  Pt had head on table shared a little then left group   Scott Vargas Scott Vargas 05/14/2024, 10:14 AM

## 2024-05-14 NOTE — Group Note (Signed)
 Recreation Therapy Group Note   Group Topic:Leisure Education  Group Date: 05/14/2024 Start Time: 0930 End Time: 0957 Facilitators: Leonie Amacher-McCall, LRT,CTRS Location: [address] Day[address]   Group Topic: Leisure Education    Goal Area(s) Addresses:  Patient will successfully identify benefits of leisure participation. Patient will successfully identify ways to access leisure activities. Patient will identify leisure activities available based on their geographical location in proximity to their home.   Behavioral Response:   Intervention: Poster Making   Activity: LRT and patients discussed leisure and its importance in a persons development. Patients had the option of working in groups or individually. Groups were given a poster board and writing utensils. Patients were to then create and advertisement that highlighted what leisure is, who can benefit and examples of leisure activities. Patients were to create their PSA in the form of a print ad or commercial. Patients presented their PSAs at the end of group.    Education:  Leisure Education, Publishing copy Outcome: Acknowledges education   Affect/Mood: N/A   Participation Level: Did not attend    Clinical Observations/Individualized Feedback:     Plan: Continue to engage patient in RT group sessions 2-3x/week.   Shariah Assad-McCall, LRT,CTRS 05/14/2024 12:22 PM

## 2024-05-14 NOTE — Group Note (Signed)
 Date:  05/14/2024 Time:  2:19 PM  Group Topic/Focus:  Questionnaire Ball The purpose of this group is to pass the questionnaire ball around the room and do the soothing activities for 10 seconds     Participation Level:  Did Not Attend  Beatris ONEIDA Hasten 05/14/2024, 2:19 PM

## 2024-05-14 NOTE — BH IP Treatment Plan (Signed)
 Interdisciplinary Treatment and Diagnostic Plan Update  05/14/2024 Time of Session: 12:15 PM - UPDATE Scott Vargas MRN: [id number]  Principal Diagnosis: Paranoid schizophrenia (HCC)  Secondary Diagnoses: Principal Problem:   Paranoid schizophrenia (HCC)   Current Medications:  Current Facility-Administered Medications  Medication Dose Route Frequency Provider Last Rate Last Admin   acetaminophen  (TYLENOL ) tablet 650 mg  650 mg Oral Q6H PRN White, Patrice L, NP       alum & mag hydroxide-simeth (MAALOX/MYLANTA) 200-200-20 MG/5ML suspension 30 mL  30 mL Oral Q4H PRN White, Patrice L, NP       amLODipine  (NORVASC ) tablet 5 mg  5 mg Oral Daily White, Patrice L, NP   5 mg at 05/14/24 0754   ARIPiprazole  (ABILIFY ) tablet 20 mg  20 mg Oral Daily Izediuno, Jerrell LABOR, MD   20 mg at 05/14/24 0753   ARIPiprazole  ER (ABILIFY  MAINTENA) injection 400 mg  400 mg Intramuscular Q28 days Izediuno, Jerrell LABOR, MD       ARIPiprazole  ER (ABILIFY  MAINTENA) injection 400 mg  400 mg Intramuscular Q28 days Izediuno, Jerrell LABOR, MD   400 mg at 05/13/24 9173   haloperidol  (HALDOL ) tablet 5 mg  5 mg Oral TID PRN White, Patrice L, NP       And   diphenhydrAMINE  (BENADRYL ) capsule 50 mg  50 mg Oral TID PRN White, Patrice L, NP       haloperidol  lactate (HALDOL ) injection 5 mg  5 mg Intramuscular TID PRN White, Patrice L, NP       And   diphenhydrAMINE  (BENADRYL ) injection 50 mg  50 mg Intramuscular TID PRN White, Patrice L, NP       And   LORazepam  (ATIVAN ) injection 2 mg  2 mg Intramuscular TID PRN White, Patrice L, NP       haloperidol  lactate (HALDOL ) injection 10 mg  10 mg Intramuscular TID PRN White, Patrice L, NP       And   diphenhydrAMINE  (BENADRYL ) injection 50 mg  50 mg Intramuscular TID PRN White, Patrice L, NP       And   LORazepam  (ATIVAN ) injection 2 mg  2 mg Intramuscular TID PRN White, Patrice L, NP       hydrOXYzine  (ATARAX ) tablet 25 mg  25 mg Oral TID PRN White, Patrice L, NP   25 mg at 05/13/24  2137   lamoTRIgine  (LAMICTAL  XR) 24 hour tablet 50 mg  50 mg Oral QHS White, Patrice L, NP   50 mg at 05/13/24 2137   magnesium  hydroxide (MILK OF MAGNESIA) suspension 30 mL  30 mL Oral Daily PRN White, Patrice L, NP       metFORMIN  (GLUCOPHAGE ) tablet 500 mg  500 mg Oral Q breakfast White, Patrice L, NP   500 mg at 05/14/24 0754   oxymetazoline  (AFRIN) 0.05 % nasal spray 1 spray  1 spray Each Nare BID Trudy Carwin, NP   1 spray at 05/14/24 0757   sertraline  (ZOLOFT ) tablet 25 mg  25 mg Oral Daily Izediuno, Vincent A, MD   25 mg at 05/14/24 0754   traZODone  (DESYREL ) tablet 50 mg  50 mg Oral QHS PRN White, Patrice L, NP   50 mg at 05/13/24 2137   triamcinolone  cream (KENALOG ) 0.1 % cream   Topical BID Izediuno, Vincent A, MD   Given at 05/14/24 0756   PTA Medications: Medications Prior to Admission  Medication Sig Dispense Refill Last Dose/Taking   amLODipine  (NORVASC ) 5 MG tablet Take 5 mg by  mouth daily.      buPROPion  (WELLBUTRIN  XL) 300 MG 24 hr tablet Take 300 mg by mouth every morning.      cetirizine (ZYRTEC) 10 MG tablet Take 10 mg by mouth daily as needed for allergies.      clonazePAM  (KLONOPIN ) 0.5 MG tablet Take 1 tablet (0.5 mg total) by mouth 2 (two) times daily. 60 tablet 1    hydrOXYzine  (VISTARIL ) 50 MG capsule Take 50 mg by mouth 3 (three) times daily as needed for anxiety.      lamoTRIgine  (LAMICTAL  XR) 50 MG 24 hour tablet Take 50 mg by mouth at bedtime.      metFORMIN  (GLUCOPHAGE ) 500 MG tablet Take 500 mg by mouth daily.      paliperidone  (INVEGA ) 3 MG 24 hr tablet Take 1 tablet (3 mg total) by mouth daily. (Patient taking differently: Take 3 mg by mouth daily as needed.) 30 tablet 1    Paliperidone  ER (INVEGA  SUSTENNA) injection Inject 117 mg into the muscle every 30 (thirty) days. 0.75 mL 2    tirzepatide (MOUNJARO) 2.5 MG/0.5ML Pen Inject 2.5 mg into the skin once a week. (Patient not taking: Reported on 05/07/2024)      triamcinolone  cream (KENALOG ) 0.1 % Apply 1  Application topically daily as needed (Apply to affected area).       Patient Stressors: Marital or family conflict   Medication change or noncompliance   Occupational concerns    Patient Strengths: Automotive engineer for treatment/growth  Supportive family/friends   Treatment Modalities: Medication Management, Group therapy, Case management,  1 to 1 session with clinician, Psychoeducation, Recreational therapy.   Physician Treatment Plan for Primary Diagnosis: Paranoid schizophrenia (HCC) Long Term Goal(s): Improvement in symptoms so as ready for discharge   Short Term Goals: Ability to identify changes in lifestyle to reduce recurrence of condition will improve Ability to verbalize feelings will improve Ability to disclose and discuss suicidal ideas Ability to demonstrate self-control will improve Ability to identify and develop effective coping behaviors will improve Ability to maintain clinical measurements within normal limits will improve Compliance with prescribed medications will improve Ability to identify triggers associated with substance abuse/mental health issues will improve  Medication Management: Evaluate patient's response, side effects, and tolerance of medication regimen.  Therapeutic Interventions: 1 to 1 sessions, Unit Group sessions and Medication administration.  Evaluation of Outcomes: Progressing  Physician Treatment Plan for Secondary Diagnosis: Principal Problem:   Paranoid schizophrenia (HCC)  Long Term Goal(s): Improvement in symptoms so as ready for discharge   Short Term Goals: Ability to identify changes in lifestyle to reduce recurrence of condition will improve Ability to verbalize feelings will improve Ability to disclose and discuss suicidal ideas Ability to demonstrate self-control will improve Ability to identify and develop effective coping behaviors will improve Ability to maintain clinical measurements within  normal limits will improve Compliance with prescribed medications will improve Ability to identify triggers associated with substance abuse/mental health issues will improve     Medication Management: Evaluate patient's response, side effects, and tolerance of medication regimen.  Therapeutic Interventions: 1 to 1 sessions, Unit Group sessions and Medication administration.  Evaluation of Outcomes: Progressing   RN Treatment Plan for Primary Diagnosis: Paranoid schizophrenia (HCC) Long Term Goal(s): Knowledge of disease and therapeutic regimen to maintain health will improve  Short Term Goals: Ability to remain free from injury will improve, Ability to verbalize frustration and anger appropriately will improve, Ability to verbalize feelings will improve, and  Ability to disclose and discuss suicidal ideas  Medication Management: RN will administer medications as ordered by provider, will assess and evaluate patient's response and provide education to patient for prescribed medication. RN will report any adverse and/or side effects to prescribing provider.  Therapeutic Interventions: 1 on 1 counseling sessions, Psychoeducation, Medication administration, Evaluate responses to treatment, Monitor vital signs and CBGs as ordered, Perform/monitor CIWA, COWS, AIMS and Fall Risk screenings as ordered, Perform wound care treatments as ordered.  Evaluation of Outcomes: Progressing   LCSW Treatment Plan for Primary Diagnosis: Paranoid schizophrenia (HCC) Long Term Goal(s): Safe transition to appropriate next level of care at discharge, Engage patient in therapeutic group addressing interpersonal concerns.  Short Term Goals: Engage patient in aftercare planning with referrals and resources, Increase ability to appropriately verbalize feelings, Facilitate acceptance of mental health diagnosis and concerns, and Identify triggers associated with mental health/substance abuse issues  Therapeutic  Interventions: Assess for all discharge needs, 1 to 1 time with Social worker, Explore available resources and support systems, Assess for adequacy in community support network, Educate family and significant other(s) on suicide prevention, Complete Psychosocial Assessment, Interpersonal group therapy.  Evaluation of Outcomes: Progressing   Progress in Treatment: Attending groups: Yes. Participating in groups: Yes. Taking medication as prescribed: Yes. Toleration medication: Yes. Family/Significant other contact made: Yes, contacted  Enrique Loureiro (father) [phone] Patient understands diagnosis: Yes. Discussing patient identified problems/goals with staff: Yes. Medical problems stabilized or resolved: Yes. Denies suicidal/homicidal ideation: Yes. Issues/concerns per patient self-inventory: No.   New problem(s) identified: No, Describe:  None   New Short Term/Long Term Goal(s): medication stabilization, elimination of SI thoughts, development of comprehensive mental wellness plan.    Patient Goals:  work on my agitation   Discharge Plan or Barriers: Patient recently admitted. CSW will continue to follow and assess for appropriate referrals and possible discharge planning.    Reason for Continuation of Hospitalization: Anxiety Medication stabilization Other; describe mood stabilization, discharge planning    Estimated Length of Stay: 2 - 3 days  Last 3 [place] Suicide Severity Risk Score: Flowsheet Row Ad[facility] (Current[facility] Most recent reading at 05/07/2024 10:46 PM ED from 05/07/2024 in [facility] Most recent reading at 05/07/2024  1:00 PM  C-SSRS RISK CATEGORY No Risk No Risk    Last PHQ 2/9 Scores:     No data to display          Scribe for Treatment Team: Jewelia Bocchino O Aldred Mase, LCSWA 05/14/2024 7:28 PM

## 2024-05-14 NOTE — Progress Notes (Signed)
 Shift Note  (Sleep Hours) - 6  (Any PRNs that were needed, meds refused, or side effects to meds)- PRN PO Trazodone  for anxiety, PRN PO Hydroxyzine    (Any disturbances and when (visitation, over night)- None  (Concerns raised by the patient)- Pt reports stuffy nose and wants his Afrin restarted.   (SI/HI/AVH)- Denies

## 2024-05-15 MED ORDER — BISACODYL 10 MG RE SUPP: 10.0000 mg | Freq: Once | RECTAL | Status: DC

## 2024-05-15 NOTE — Progress Notes (Signed)
 Forearm service [facility] MD Progress Note  05/15/2024 11:13 AM Alfio Loescher  MRN:  [id number] Subjective:   28 year old Caucasian male, single, unemployed, lives with his family.  Background history of schizophrenia.  Presented in company of his family on account of worsening psychosis.  Reported to have voiced violent thoughts towards his family and towards his past.  Has expressed thoughts of stabbing them.  Reported to routinely dig up graves in the backyard and then covered him off.  Reported to be preoccupied with religion and white supremacy.  Reported to have expressed violent thoughts towards black people age 10 to 29 years. Deteriorated after he weaned himself off Invega  which caused him to gain weight. Routine labs are essentially normal.  No alcohol and psychoactive substance on board.  Chart reviewed.  Patient discussed at multidisciplinary team meeting.  Nursing staff states that patient has been appropriate on the unit.  He slept for 7.25 hours.  He has been participating with unit groups and therapeutic activities.  He is grooming himself appropriately.  Good intake of food and fluids.  Adherent with treatment.  No PRNs.  We will observe response to internal stimuli.  Seen today.  Patient is looking forward to his parents visit today.  He tells me that he is in good spirits.  He is not endorsing any worries or any concerns.  He is not endorsing any hallucination.  He is not endorsing any delusion.  No rageful thoughts towards anyone or to himself.  States that he feels relaxed with his current regimen.  Patient states that he was in an AA meeting yesterday.  States that he triggered some anxiety for him.  He acknowledged use of beer and THC to relax himself.  States that he drinks in moderation.  He plans to quit [facility] as his parents have repeatedly told him he currently uses and stay with them.  Psychoeducation on role of THC in perpetuating schizophrenia was provided to him.  Patient  encouraged to stay of THC based products.   Principal Problem: Paranoid schizophrenia (HCC) Diagnosis: Principal Problem:   Paranoid schizophrenia (HCC)  Total Time spent with patient: 30 minutes  Past Psychiatric History:  Diagnoses: Schizoaffective disorder. Alcohol use disorder Outpatient Psychiatry: currently followed by [facility] Engaged in counseling with [facility]izations: Hospitalized at least 5 times in the past. Most recent hospitalization: 3 weeks ago in [place] for homicidal ideation (3-day stay). No past suicide attempts. Reports passive suicidal thoughts years ago (e.g., thoughts of shooting himself)  Current Psychiatric Medications: Invega  117 mg IM every 4 weeks (last dose: 05/05/24) Invega  3 mg PO as needed Klonopin  0.5 mg BID (last filled: 04/05/24, 30-day supply) Hydroxyzine  as needed Wellbutrin  300 mg daily Lamictal  50 mg at bedtime  Past Medications: Risperidone , Invega .   Past Medical History:  Past Medical History:  Diagnosis Date   Diabetes mellitus without complication (HCC)    Migraine    History reviewed. No pertinent surgical history. Family History: History reviewed. No pertinent family history. Family Psychiatric  History:  See H&P  Social History:  Social History   Substance and Sexual Activity  Alcohol Use Yes     Social History   Substance and Sexual Activity  Drug Use Yes   Types: Marijuana    Social History   Socioeconomic History   Marital status: Single    Spouse name: Not on file   Number of children: Not on file   Years of education: Not on file  Highest education level: Not on file  Occupational History   Not on file  Tobacco Use   Smoking status: Light Smoker    Types: Cigars   Smokeless tobacco: Never  Substance and Sexual Activity   Alcohol use: Yes   Drug use: Yes    Types: Marijuana   Sexual activity: Not Currently    Birth control/protection: None  Other  Topics Concern   Not on file  Social History Narrative   Not on file   Social Drivers of Health   Financial Resource Strain: Medium Risk (10/30/2020)   Received from [facility]   Overall Financial Resource Strain (CARDIA)    Difficulty of Paying Living Expenses: Somewhat hard  Food Insecurity: No Food Insecurity (05/08/2024)   Hunger Vital Sign    Worried About Running Out of Food in the Last Year: Never true    Ran Out of Food in the Last Year: Never true  Transportation Needs: No Transportation Needs (05/08/2024)   PRAPARE - Administrator, Civil Service (Medical): No    Lack of Transportation (Non-Medical): No  Physical Activity: Not on file  Stress: Stress Concern Present (10/30/2020)   Received from [facility] - Occupational Stress Questionnaire    Feeling of Stress : To some extent  Social Connections: Unknown (03/10/2022)   Received from [facility]   Social Network    Social Network: Not on file    Current Medications: Current Facility-Administered Medications  Medication Dose Route Frequency Provider Last Rate Last Admin   acetaminophen  (TYLENOL ) tablet 650 mg  650 mg Oral Q6H PRN White, Patrice L, NP       alum & mag hydroxide-simeth (MAALOX/MYLANTA) 200-200-20 MG/5ML suspension 30 mL  30 mL Oral Q4H PRN White, Patrice L, NP       amLODipine  (NORVASC ) tablet 5 mg  5 mg Oral Daily White, Patrice L, NP   5 mg at 05/15/24 9187   ARIPiprazole  (ABILIFY ) tablet 20 mg  20 mg Oral Daily Taraoluwa Thakur, Jerrell LABOR, MD   20 mg at 05/15/24 0813   ARIPiprazole  ER (ABILIFY  MAINTENA) injection 400 mg  400 mg Intramuscular Q28 days Kamali Nephew, Jerrell LABOR, MD       ARIPiprazole  ER (ABILIFY  MAINTENA) injection 400 mg  400 mg Intramuscular Q28 days Falynn Ailey, Jerrell LABOR, MD   400 mg at 05/13/24 9173   bisacodyl  (DULCOLAX) suppository 10 mg  10 mg Rectal Once Laurey Salser, Jerrell LABOR, MD       haloperidol  (HALDOL ) tablet 5 mg  5 mg Oral TID PRN  White, Patrice L, NP       And   diphenhydrAMINE  (BENADRYL ) capsule 50 mg  50 mg Oral TID PRN White, Patrice L, NP       haloperidol  lactate (HALDOL ) injection 5 mg  5 mg Intramuscular TID PRN White, Patrice L, NP       And   diphenhydrAMINE  (BENADRYL ) injection 50 mg  50 mg Intramuscular TID PRN White, Patrice L, NP       And   LORazepam  (ATIVAN ) injection 2 mg  2 mg Intramuscular TID PRN White, Patrice L, NP       haloperidol  lactate (HALDOL ) injection 10 mg  10 mg Intramuscular TID PRN White, Patrice L, NP       And   diphenhydrAMINE  (BENADRYL ) injection 50 mg  50 mg Intramuscular TID PRN White, Patrice L, NP       And   LORazepam  (ATIVAN )  injection 2 mg  2 mg Intramuscular TID PRN White, Patrice L, NP       hydrOXYzine  (ATARAX ) tablet 25 mg  25 mg Oral TID PRN White, Patrice L, NP   25 mg at 05/14/24 2047   lamoTRIgine  (LAMICTAL  XR) 24 hour tablet 50 mg  50 mg Oral QHS White, Patrice L, NP   50 mg at 05/14/24 2152   magnesium  hydroxide (MILK OF MAGNESIA) suspension 30 mL  30 mL Oral Daily PRN White, Patrice L, NP       metFORMIN  (GLUCOPHAGE ) tablet 500 mg  500 mg Oral Q breakfast White, Patrice L, NP   500 mg at 05/15/24 9187   oxymetazoline  (AFRIN) 0.05 % nasal spray 1 spray  1 spray Each Nare BID Trudy Carwin, NP   1 spray at 05/14/24 0757   sertraline  (ZOLOFT ) tablet 25 mg  25 mg Oral Daily Franklin Clapsaddle A, MD   25 mg at 05/15/24 9187   traZODone  (DESYREL ) tablet 50 mg  50 mg Oral QHS PRN White, Patrice L, NP   50 mg at 05/14/24 2225   triamcinolone  cream (KENALOG ) 0.1 % cream   Topical BID Hinda Jerrell LABOR, MD   Given at 05/14/24 0756    Lab Results:  No results found for this or any previous visit (from the past 48 hours).   Blood Alcohol level:  Lab Results  Component Value Date   [facility] <15 05/07/2024   ETH <10 08/25/2020    Metabolic Disorder Labs: Lab Results  Component Value Date   HGBA1C 5.5 05/07/2024   MPG 111 05/07/2024   No results found for:  PROLACTIN Lab Results  Component Value Date   CHOL 216 (H) 05/07/2024   TRIG 71 05/07/2024   HDL 49 05/07/2024   CHOLHDL 4.4 05/07/2024   VLDL 14 05/07/2024   LDLCALC 153 (H) 05/07/2024    Physical Findings: AIMS:  ,  ,  ,  ,  ,  ,   CIWA:    COWS:     Musculoskeletal: Strength & Muscle Tone: within normal limits Gait & Station: normal Patient leans: N/A  Psychiatric Specialty Exam:  Presentation  General Appearance and behavior:  Casually dressed, not in any distress, appropriate behavior, pleasant and polite.  No EPS.  Eye Contact: Good.  Speech: Spontaneous.  Normal rate, tone and volume.  Normal prosody of speech.  Mood and Affect  Mood: Euthymic. Affect: Full range and mood congruent.  Thought Process  Thought Processes: Linear and goal directed.  Descriptions of Associations:Intact  Orientation:Full (Time, Place and Person)  Thought Content: Future oriented.  No current suicidal thoughts.  No current homicidal thoughts.  No thoughts of violence.  No negative ruminative flooding.  No guilty ruminations.  No delusional theme.  No obsessions.  Hallucinations: No hallucination in any modality.  Sensorium  Memory: Good.  Judgment: Better.  Insight: Good  Executive Functions  Concentration: Good.  Attention Span: Good.  Recall: Good.  Fund of Knowledge: Good.  Language: Good   Psychomotor Activity  Normal psychomotor activity    Physical Exam: Physical Exam ROS Blood pressure 121/78, pulse 85, temperature 97.8 F (36.6 C), temperature source Oral, resp. rate 18, height 5' 10 (1.778 m), weight 89.4 kg, SpO2 99%. Body mass index is 28.27 kg/m.   Treatment Plan Summary: Patient is doing well on his regimen.  There are no residual psychotic symptoms.  No current dangerousness.  No evidence of depression.  He is tolerating his medications well.  We will maintain his current regimen.  His parents are visiting today and they  will give her some feedback.  Hopeful discharge tomorrow or with his parents today.  1.  Aripiprazole  to 20 mg daily for seven days. 2.  Abilify  maintainer 400 mg monthly.   3.  Sertraline  25 mg daily. 4.  Lamotrigine  XL 50 mg daily. 5.  Continue to monitor mood behavior and interaction with others. 6.  Continue to encourage unit groups and therapeutic activities. 7.  Social worker will coordinate discharge and aftercare planning.  Jerrell DELENA Forehand, MD 05/15/2024, 11:13 AM

## 2024-05-15 NOTE — Group Note (Signed)
 Date:  05/15/2024 Time:  12:00 AM  Group Topic/Focus:  Relapse Prevention Planning:   The focus of this group is to define relapse and discuss the need for planning to combat relapse.  Alcoholics Anonymous  Participation Level:  Active  Participation Quality:  Attentive  Affect:  Appropriate  Cognitive:  Appropriate  Insight: Appropriate  Engagement in Group:  Supportive  Modes of Intervention:  Support  Additional Comments:  Patient appeared engaged and attentive.  [name] 05/15/2024, 12:00 AM

## 2024-05-15 NOTE — Group Note (Signed)
 Date:  05/15/2024 Time:  12:33 PM  Group Topic/Focus:   Music Therapy:   The focus of this group is to utilize music lyric substitution to reframe challenges and promote change,acceptance, and resilience.   Participation Level:  Active  Participation Quality:  Appropriate and Sharing  Affect:  Appropriate  Cognitive:  Appropriate  Insight: Appropriate  Engagement in Group:  Developing/Improving  Modes of Intervention:  Exploration  Additional Comments:    Cruz JONETTA Mars 05/15/2024, 12:33 PM

## 2024-05-15 NOTE — [facility] Group Notes (Signed)
 [facility] Group Notes:  (Nursing/MHT/Case Management/Adjunct)  Date:  05/15/2024  Time:  9:33 PM  Type of Therapy:  Wrap-up group  Participation Level:  Active  Participation Quality:  Appropriate  Affect:  Appropriate  Cognitive:  Appropriate  Insight:  Appropriate  Engagement in Group:  Engaged  Modes of Intervention:  Education  Summary of Progress/Problems: Goal to get rest, met goal. Rated day 7/10.  Scott Vargas [place] 05/15/2024, 9:33 PM

## 2024-05-15 NOTE — Group Note (Signed)
 Date:  05/15/2024 Time:  9:40 AM  Group Topic/Focus:  Goals Group:   The focus of this group is to help patients establish daily goals to achieve during treatment and discuss how the patient can incorporate goal setting into their daily lives to aide in recovery. Orientation:   The focus of this group is to educate the patient on the purpose and policies of crisis stabilization and provide a format to answer questions about their admission.  The group details unit policies and expectations of patients while admitted.    Participation Level:  Minimal  Participation Quality:  Appropriate  Affect:  Appropriate  Cognitive:  Appropriate  Insight: Appropriate  Engagement in Group:  Developing/Improving and Limited  Modes of Intervention:  Discussion and Orientation  Additional Comments:    Scott Vargas D Marcelo Ickes 05/15/2024, 9:40 AM

## 2024-05-15 NOTE — Progress Notes (Signed)
(  Sleep Hours) - 7.25 (Any PRNs that were needed, meds refused, or side effects to meds)- Hydroxyzine , Trazadone (Any disturbances and when (visitation, over night)- none (Concerns raised by the patient)- none (SI/HI/AVH)- denied

## 2024-05-15 NOTE — Group Note (Signed)
 LDate/Time:  @TD @ 1:00-2:00  Type of Therapy and Topic:  Group Therapy:  Safety  Participation Level:  Minimal   Description of Group This process group involved a discussion with and between patients about Safety.   This group revolves around central idea: You need to stay safe, the patient will learn to cope safely, no matter what negative life events come their way. The patient will identify and describe their safe and unsafe environment.   This then was followed by a discussion about how the patient are committed to their self, what it is, how important it is, and why we choose the coping techniques.  Participants were encouraged to think about commitment as necessary and positive.  Therapeutic Goals Patient will identify and describe one their safety Patient will participate in generating ideas about safety and how it can impact their environment The patient are encourage to explore their safety and who they would want to be their safety person.   Summary of Patient Progress:  The patient expressed that he does not have a safe place, the patient  share that he does not understand his anger and he just want to his someone   Therapeutic Modalities Brief Solution-Focused Therapy Psychoeducation Glorianne Proctor O Xxavier Noon, LCSWA 05/15/2024  4:19 PM

## 2024-05-16 MED ORDER — SERTRALINE HCL 25 MG PO TABS: 25.0000 mg | ORAL_TABLET | Freq: Every day | ORAL | 0 refills | Status: DC

## 2024-05-16 MED ORDER — ARIPIPRAZOLE 20 MG PO TABS: 20.0000 mg | ORAL_TABLET | Freq: Every day | ORAL | 0 refills | Status: DC

## 2024-05-16 MED ORDER — ARIPIPRAZOLE ER 400 MG IM SRER: 400.0000 mg | INTRAMUSCULAR | 0 refills | Status: AC

## 2024-05-16 NOTE — Discharge Summary (Signed)
 Physician Discharge Summary Note  Patient:  Scott Vargas is an 28 y.o., male MRN:  [id number] DOB:  February 07, 1996 Patient phone:  [phone] (home)  Patient address:   [address] [place] [address],  Total Time spent with patient: 45 minutes  Date of Admission:  05/07/2024 Date of Discharge: 05/16/2024  Reason for Admission:    The patient reports I am feeling good today, I think you can let me go. WE discussed circumstances of the current admission. He admitted to waking up yesterday suddenly in the middle of the night having the intrusive thoughts about harming his family using a kitchen knife, thet he reported to his parents. He denies having such thoughts currently. Over the last three months, he reports a worsening in his mood and thought content. Symptoms include mood instability marked by anger, racing thoughts, and disrupted sleep. He notes an increase in appetite over the past two months, which he attributes to restarting his Invega  injection. He further reported experiencing recurrent on-and off violent thoughts over the past several months, particularly directed toward his family and his pastor. He attributes some of these thoughts to his upbringing in a strict Christian household. While he still believes in a higher power, he no longer identifies as [place]. In addition, Mr. Liebert shared that he has been experiencing racially charged intrusive thoughts directed at [facility] males aged 20-30. He described a recent gravitation toward [employer], which he believes developed during his stay with his grandfather, who holds racist views. He clarified that the violent ideation remains internal and has not translated into actions. He denies current homicidal thoughts, plans. He denies suicidal ideation and has no history of suicide attempts. He denies auditory or visual hallucinations and reports no recent or past episodes of physical aggression.  He also  mentioned that thinks he is depressed, based on episodes of low mood, feeling unhappy, unmotivated and tired. He is asking to start him on happy pill.   Mr. Manon receives outpatient psychiatric care at [facility]. His current medication regimen includes: Invega  117 mg IM every 4 weeks (last administered May 05, 2024) Invega  3 mg orally as needed Klonopin  0.5 mg twice daily (last filled April 05, 2024, 30-day supply) Hydroxyzine  as needed Wellbutrin  300 mg daily Lamictal  50 mg at bedtime He is also engaged in counseling services through [facility].  His medical history includes type 2 diabetes and hypertension. He takes metformin  (exact dosage unknown), Mounjaro as needed, and amlodipine  5 mg daily.   We discussed his psychotropic medications. I disclosed that in my opinion, Wellbutrin  may worsen or precipitate homicidal thoughts in rare cases and suggested to reduce the dose of the medication; patient agreed to try. Also, he may benefit from an addition of SSRI for treating his intrusive thoughts as well as depression. The patient disclosed he was thinking of trying Zoloft  for a while based on his own research.   Principal Problem: Paranoid schizophrenia [facility]) Discharge Diagnoses: Principal Problem:   Paranoid schizophrenia (HCC)   Past Psychiatric History:  Diagnoses: Schizoaffective disorder. Alcohol use disorder Outpatient Psychiatry: currently followed by [facility] Engaged in counseling with [facility]izations: Hospitalized at least 5 times in the past. Most recent hospitalization: 3 weeks ago in [place] for homicidal ideation (3-day stay). No past suicide attempts. Reports passive suicidal thoughts years ago (e.g., thoughts of shooting himself)  Current Psychiatric Medications: Invega  117 mg IM every 4 weeks (last dose: 05/05/24) Invega  3 mg PO as  needed Klonopin  0.5 mg BID (last filled: 04/05/24, 30-day  supply) Hydroxyzine  as needed Wellbutrin  300 mg daily Lamictal  50 mg at bedtime  Past Medications: Risperidone , Invega .    Past Medical History:  Past Medical History:  Diagnosis Date   Diabetes mellitus without complication (HCC)    Migraine    History reviewed. No pertinent surgical history. Family History: History reviewed. No pertinent family history. Family Psychiatric  History:   Social History:  Social History   Substance and Sexual Activity  Alcohol Use Yes     Social History   Substance and Sexual Activity  Drug Use Yes   Types: Marijuana    Social History   Socioeconomic History   Marital status: Single    Spouse name: Not on file   Number of children: Not on file   Years of education: Not on file   Highest education level: Not on file  Occupational History   Not on file  Tobacco Use   Smoking status: Light Smoker    Types: Cigars   Smokeless tobacco: Never  Substance and Sexual Activity   Alcohol use: Yes   Drug use: Yes    Types: Marijuana   Sexual activity: Not Currently    Birth control/protection: None  Other Topics Concern   Not on file  Social History Narrative   Not on file   Social Drivers of Health   Financial Resource Strain: Medium Risk (10/30/2020)   Received from [facility]   Overall Financial Resource Strain (CARDIA)    Difficulty of Paying Living Expenses: Somewhat hard  Food Insecurity: No Food Insecurity (05/08/2024)   Hunger Vital Sign    Worried About Running Out of Food in the Last Year: Never true    Ran Out of Food in the Last Year: Never true  Transportation Needs: No Transportation Needs (05/08/2024)   PRAPARE - Administrator, Civil Service (Medical): No    Lack of Transportation (Non-Medical): No  Physical Activity: Not on file  Stress: Stress Concern Present (10/30/2020)   Received from [facility] - Occupational Stress Questionnaire    Feeling of Stress :  To some extent  Social Connections: Unknown (03/10/2022)   Received from [facility]   Social Network    Social Network: Not on file    [facility] Course:   Patient was admitted on suicide precautions.  Home medications was reinstated.  We switched him from Invega  to aripiprazole .  We weaned him off bupropion  and clonazepam .  We decreased the dose of sertraline  to 25 mg daily.  We maintained lamotrigine  at the same dose. Patient responded well to aripiprazole .  He consented to the longer acting injectable.  He received Abilify  maintainer 400 mg on 05/13/2024.  He is due his next dose in a month from that date. As his medications kicked him, psychosis resolved.  Patient was no longer experiencing hallucinations.  The delusions loosened and resolved completely.  He maintained a normal sleep-wake cycle.  He participated with unit groups and therapeutic activities.  He did not require any psychiatric or medical emergency measures during his hospital stay.  His parents visited repeatedly and provided feedback.  Nursing staff reports that patient has been appropriate on the unit. Patient has been interacting well with peers. No behavioral issues.  Patient has not voiced any suicidal thoughts. Patient has not been observed to be internally stimulated. Patient has been adherent with treatment recommendations. Patient has been tolerating their  medication well.  I spoke to patient's father today.  He tells me that patient is doing really well.  Parents feel safe with him.  They have not observed any residual psychotic symptoms.  No current concerns about dangerousness.  Father will come pick him up today.  At interview with patient, he tells me that he is doing well.  He is in good spirits.  He is not endorsing any form of delusion.  He is not endorsing any hallucination.  No evidence of depression.  No overwhelming anxiety.  No manic symptoms.  No adverse effects from his medications.  Family, patient and  team agrees that he is back to his baseline.  Team agrees with discharge today.    Physical Findings: AIMS:  , ,  ,  ,  ,  ,   CIWA:    COWS:     Musculoskeletal: Strength & Muscle Tone: within normal limits Gait & Station: normal Patient leans: N/A   Psychiatric Specialty Exam:  Presentation  General Appearance:  Casually dressed, overweight, not in any distress, appropriate behavior, engaged politely.  No EPS.  Eye Contact: Good.  Speech: Spontaneous.  Normal rate, tone and volume.  Normal prosody of speech.  Mood and Affect  Mood: Euthymic.  Affect: Full range and appropriate.  Thought Process  Thought Processes: Linear and goal directed.  Descriptions of Associations:Intact  Orientation:Full (Time, Place and Person)  Thought Content: Future oriented.  No current suicidal thoughts.  No homicidal thoughts.  No thoughts of violence.  No negative ruminative flooding.  No guilty ruminations.  No delusional theme.  No obsessions.  Hallucinations: No hallucination in any modality.  Sensorium  Memory: Good.  Judgment: Good.  Insight: Good  Executive Functions  Concentration: Good.  Attention Span: Good.  Recall: Good.  Fund of Knowledge: Good.  Language: Good   Psychomotor Activity  Normal psychomotor activity    Physical Exam: Physical Exam ROS Blood pressure 129/86, pulse 75, temperature (!) 97.5 F (36.4 C), temperature source Oral, resp. rate 18, height 5' 10 (1.778 m), weight 89.4 kg, SpO2 99%. Body mass index is 28.27 kg/m.   Social History   Tobacco Use  Smoking Status Light Smoker   Types: Cigars  Smokeless Tobacco Never   Tobacco Cessation:  N/A, patient does not currently use tobacco products   Blood Alcohol level:  Lab Results  Component Value Date   [facility] <15 05/07/2024   ETH <10 08/25/2020    Metabolic Disorder Labs:  Lab Results  Component Value Date   HGBA1C 5.5 05/07/2024   MPG 111 05/07/2024    No results found for: PROLACTIN Lab Results  Component Value Date   CHOL 216 (H) 05/07/2024   TRIG 71 05/07/2024   HDL 49 05/07/2024   CHOLHDL 4.4 05/07/2024   VLDL 14 05/07/2024   LDLCALC 153 (H) 05/07/2024    See Psychiatric Specialty Exam and Suicide Risk Assessment completed by Attending Physician prior to discharge.  Discharge destination:  Home  Is patient on multiple antipsychotic therapies at discharge:  No   Has Patient had three or more failed trials of antipsychotic monotherapy by history:  No  Recommended Plan for Multiple Antipsychotic Therapies: NA  Discharge Instructions     Diet - low sodium heart healthy   Complete by: As directed    Increase activity slowly   Complete by: As directed       Allergies as of 05/16/2024   No Known Allergies  Medication List     STOP taking these medications    buPROPion  300 MG 24 hr tablet Commonly known as: WELLBUTRIN  XL   cetirizine 10 MG tablet Commonly known as: ZYRTEC   clonazePAM  0.5 MG tablet Commonly known as: KLONOPIN    hydrOXYzine  50 MG capsule Commonly known as: VISTARIL    Invega  Sustenna injection Generic drug: Paliperidone  ER   Mounjaro 2.5 MG/0.5ML Pen Generic drug: tirzepatide   paliperidone  3 MG 24 hr tablet Commonly known as: INVEGA        TAKE these medications      Indication  amLODipine  5 MG tablet Commonly known as: NORVASC  Take 5 mg by mouth daily.  Indication: High Blood Pressure   ARIPiprazole  20 MG tablet Commonly known as: ABILIFY  Take 1 tablet (20 mg total) by mouth daily for 7 days. Start taking on: May 17, 2024  Indication: Schizophrenia   ARIPiprazole  ER 400 MG Srer injection Commonly known as: ABILIFY  MAINTENA Inject 2 mLs (400 mg total) into the muscle every 28 (twenty-eight) days. Start taking on: June 10, 2024  Indication: Schizophrenia   lamoTRIgine  50 MG 24 hour tablet Commonly known as: LAMICTAL  XR Take 50 mg by mouth at bedtime.   Indication: Depressive Phase of Manic-Depression   metFORMIN  500 MG tablet Commonly known as: GLUCOPHAGE  Take 500 mg by mouth daily.  Indication: OBESITY   sertraline  25 MG tablet Commonly known as: ZOLOFT  Take 1 tablet (25 mg total) by mouth daily. Start taking on: May 17, 2024  Indication: Major Depressive Disorder   triamcinolone  cream 0.1 % Commonly known as: KENALOG  Apply 1 Application topically daily as needed (Apply to affected area).  Indication: Skin Disease Successfully Treated with Steroid Therapy        Follow-up Information     [facility] Follow up on 05/26/2024.   Specialty: Behavioral Health Why: You are schedule for therapy services on 05/26/24 at 4:00 pm.  You also have an appointment for medication management services on 06/01/24 at 10:00 am. Contact information: [address] [place]  [address] [phone]                Follow-up recommendations:   Patient will stay on his medications as recommended.  Patient will abstain from [facility] based products and alcohol.  No restrictions with respect to diet.  No restrictions with respect to level of activity.  Signed: Jerrell DELENA Forehand, MD 05/16/2024, 10:45 AM

## 2024-05-16 NOTE — Plan of Care (Signed)
?  Problem: Coping: ?Goal: Ability to verbalize frustrations and anger appropriately will improve ?Outcome: Progressing ?Goal: Ability to demonstrate self-control will improve ?Outcome: Progressing ?  ?Problem: Safety: ?Goal: Periods of time without injury will increase ?Outcome: Progressing ?  ?Problem: Health Behavior/Discharge Planning: ?Goal: Identification of resources available to assist in meeting health care needs will improve ?Outcome: Progressing ?Goal: Compliance with treatment plan for underlying cause of condition will improve ?Outcome: Progressing ?  ?

## 2024-05-16 NOTE — BH Assessment (Signed)
(  Sleep Hours) - 6.5 Trazodone  given for sleep. Tylenol  for a headache. Patient denies si,hi and avh. Patient did have a hard time sleeping last night. Patient was calm and pleasant.

## 2024-05-16 NOTE — Progress Notes (Signed)
 Patient discharged to home accompanied by family member. Discharge instructions, all required discharge documents and information about follow-up appointment given to pt with verbalization of understanding. All personal belongings returned to pt at time of discharge. Pt escorted to lobby by RN at 1140.  05/16/24 0915  Psych Admission Type (Psych Patients Only)  Admission Status Voluntary  Psychosocial Assessment  Patient Complaints Anxiety  Eye Contact Fair  Facial Expression Animated  Affect Appropriate to circumstance  Speech Logical/coherent  Interaction Assertive  Motor Activity Other (Comment) (wnl)  Appearance/Hygiene Unremarkable  Behavior Characteristics Cooperative  Mood Anxious;Pleasant  Thought Process  Coherency WDL  Content WDL  Delusions None reported or observed  Perception WDL  Hallucination None reported or observed  Judgment WDL  Confusion None  Danger to Self  Current suicidal ideation? Denies  Agreement Not to Harm Self Yes  Description of Agreement Verbal  Danger to Others  Danger to Others None reported or observed

## 2024-05-16 NOTE — Group Note (Signed)
 Date:  05/16/2024 Time:  9:48 AM  Group Topic/Focus:  Goals Group:   The focus of this group is to help patients establish daily goals to achieve during treatment and discuss how the patient can incorporate goal setting into their daily lives to aide in recovery.    Participation Level:  Did Not Attend

## 2024-05-16 NOTE — Progress Notes (Signed)
  [facility] Adult Case Management Discharge Plan :  Will you be returning to the same living situation after discharge:  Yes,    At discharge, do you have transportation home?: Yes,    Do you have the ability to pay for your medications: Yes,  [employer]  Release of information consent forms completed and in the chart;  Patient's signature needed at discharge.  Patient to Follow up at:  Follow-up Information     [facility] Follow up on 05/26/2024.   Specialty: Behavioral Health Why: You are schedule for therapy services on 05/26/24 at 4:00 pm.  You also have an appointment for medication management services on 06/01/24 at 10:00 am. Contact information: [address] [place]  [address] [phone]                Next level of care provider has access to [facility] Link:no  Safety Planning and Suicide Prevention discussed: Yes,  Enrique Prieur (father) [phone]    Has patient been referred to the Quitline?: Patient refused referral for treatment  Patient has been referred for addiction treatment: Patient refused referral for treatment. Pt denied addiction  Golda Louder, LCSWA 05/16/2024, 10:32 AM

## 2024-05-16 NOTE — Plan of Care (Signed)

## 2024-05-16 NOTE — BHH Suicide Risk Assessment (Signed)
 [facility] Discharge Suicide Risk Assessment   Principal Problem: Paranoid schizophrenia [facility]) Discharge Diagnoses: Principal Problem:   Paranoid schizophrenia (HCC)   Total Time spent with patient: 30 minutes  Musculoskeletal: Strength & Muscle Tone: within normal limits Gait & Station: normal Patient leans: N/A  Psychiatric Specialty Exam  Presentation  General Appearance:  Casually dressed, overweight, not in any distress, appropriate behavior, engaged politely.  No EPS.  Eye Contact: Good.  Speech: Spontaneous.  Normal rate, tone and volume.  Normal prosody of speech.  Mood and Affect  Mood: Euthymic.  Affect: Full range and appropriate.  Thought Process  Thought Processes: Linear and goal directed.  Descriptions of Associations:Intact  Orientation:Full (Time, Place and Person)  Thought Content: Future oriented.  No current suicidal thoughts.  No homicidal thoughts.  No thoughts of violence.  No negative ruminative flooding.  No guilty ruminations.  No delusional theme.  No obsessions.  Hallucinations: No hallucination in any modality.  Sensorium  Memory: Good.  Judgment: Good.  Insight: Good  Executive Functions  Concentration: Good.  Attention Span: Good.  Recall: Good.  Fund of Knowledge: Good.  Language: Good   Psychomotor Activity  Normal psychomotor activity   Physical Exam: Physical Exam ROS Blood pressure 129/86, pulse 75, temperature (!) 97.5 F (36.4 C), temperature source Oral, resp. rate 18, height 5' 10 (1.778 m), weight 89.4 kg, SpO2 99%. Body mass index is 28.27 kg/m.  Mental Status Per Nursing Assessment::   On Admission:  Thoughts of violence towards others  Demographic Factors:  Male and Adolescent or young adult  Loss Factors: NA  Historical Factors: Family history of mental illness or substance abuse  Risk Reduction Factors:   Living with another person, especially a relative, Positive social support,  Positive therapeutic relationship, and Positive coping skills or problem solving skills  Continued Clinical Symptoms:  Psychosis has completely resolved.  No evidence of mania.  No evidence of depression.  No overwhelming anxiety. Cognitive Features That Contribute To Risk:  None    Suicide Risk:  Minimal: No current suicidal thoughts.  No current homicidal thoughts.  No current thoughts of violence.  Modifiable risk factor targeted during this admission as psychosis.  Patient has responded well to antipsychotic medication.  He is currently on a long-acting injectable.  His family remains supportive.  No new psychosocial stressor. At this point in time, patient is stable for care at the lower setting.  Follow-up Information     [facility] Follow up on 05/26/2024.   Specialty: Behavioral Health Why: You are schedule for therapy services on 05/26/24 at 4:00 pm.  You also have an appointment for medication management services on 06/01/24 at 10:00 am. Contact information: [address] [place]   [address] [phone]                Plan Of Care/Follow-up recommendations:  Patient will stay on his medications as recommended.  Patient will abstain from [facility] based products and alcohol.  No restrictions with respect to diet.  No restrictions with respect to level of activity.  Jerrell DELENA Forehand, MD 05/16/2024, 10:42 AM

## 2024-05-26 ENCOUNTER — Ambulatory Visit (INDEPENDENT_AMBULATORY_CARE_PROVIDER_SITE_OTHER): Payer: MEDICAID | Admitting: Mental Health

## 2024-05-26 DIAGNOSIS — F259 Schizoaffective disorder, unspecified: Secondary | ICD-10-CM

## 2024-05-26 NOTE — Progress Notes (Signed)
 [facility] Note  Name: Scott Vargas Date:  05/26/24 MRN: [id number] DOB: 09-Sep-1996 PCP: Arloa Elsie SAUNDERS, MD  Time spent:   Treatment:  individual therapy  Virtual Visit via Telehealth Note Connected with patient by a telemedicine/telehealth application, with their informed consent, and verified patient privacy and that I am speaking with the correct person using two identifiers. I discussed the limitations, risks, security and privacy concerns of performing psychotherapy and the availability of in person appointments. I also discussed with the patient that there may be a patient responsible charge related to this service. The patient expressed understanding and agreed to proceed. I discussed the treatment planning with the patient. The patient was provided an opportunity to ask questions and all were answered. The patient agreed with the plan and demonstrated an understanding of the instructions. The patient was advised to call  our office if  symptoms worsen or feel they are in a crisis state and need immediate contact.   Therapist Location: office Patient Location: home   Mental Status Exam:    Appearance:    Casual     Behavior:   Appropriate  Motor:   WNL  Speech/Language:    Clear and Coherent  Affect:   Full range   Mood:   Euthymic, some sadness  Thought process:   normal  Thought content:     WNL  Sensory/Perceptual disturbances:     none  Orientation:   x4  Attention:   Good  Concentration:   Good  Memory:   Intact  Fund of knowledge:    Consistent with age and development  Insight:     Good  Judgment:    Good  Impulse Control:   Good   Reported Symptoms:  anxiety, depression, rumination, avoidant behavior (crowds at times)  Risk Assessment: Danger to Self:  No Self-injurious Behavior: No Danger to Others: Thoughts of harming others but denies any plan or intent to harm others or follow through meeting. Duty to Warn:no Physical  Aggression / Violence:No  Access to Firearms a concern: No  Gang Involvement:No  Patient / guardian was educated about steps to take if suicide or homicide risk level increases between visits: yes While future psychiatric events cannot be accurately predicted, the patient does not currently require acute inpatient psychiatric care and does not currently meet [place]  involuntary commitment criteria.  Diagnoses:  No diagnosis found.  Subjective:  Patient engaged in telehealth session via video.  Assessed progress, events since last visit.  He stated that he went to his psychiatric inpatient and was discharged about 2 weeks ago.  He shared how he was admitted due to violent thoughts- threatened his mother as he stated that he had delusional thinking and the sheriff was called but was able to go voluntarily to the hospital.  He stated that he was started on a new injectable medication to treat his symptoms and he is medication compliant with his other psychiatric medications.  He stated that he feels the med change has been helpful thus far, denies any recent delusional thinking, none observable in the session. He stated that he is trying to focus on self-care and wellness by exercising. He plans to stay with his grandfather 4 days/week to assist with providing care and assistance.  He also make some income.  He feels this will be a better fit as opposed to his engaging in part-time employment at this time.  Coping with some insomnia the last 2 days. Further identifying some  anxiety, feels he cannot fit in with others at times.  He shared other aspects of what he is coming to learn and understand about himself.  He continues to express wanting a girlfriend at some point and went on a day recently.  He stated that he also plans to allow this to be a process and not be in any hurry to get into a relationship.  Facilitated his identifying needs and ways to maintain stability and focus on self-care,  provide support and understanding throughout.  Patient plans to follow up with his psychiatric appointment at [facility] next week and plans to return to therapy in 2 weeks.  Invited him to contact our office between visits if needed.  Interventions:   Motivational interviewing, supportive therapy   Plan: Patient is to use CBT, mindfulness and coping skills to help decrease symptoms associated with their diagnosis.  Patient to continue to identify ways to cope and care for himself, attend doctor appointments about following through with his primary care physician as well as his psychiatry appointments.    Long-term goal:   Reduce overall level, frequency, and intensity of the feelings of anxiety in social situations for up to 3 months consecutively.   Decrease feelings of sadness and anxiety related to life stressors.   Short-term goal:  Patient to continue to work on identifying thoughts that make his anxiety increased in social situations and to continue to allow himself to engage in social situations to continue to try and work past his anxiety.   Patient to ground himself with rational, calming self talk when having intrusive thoughts. Increased feelings of self-worth by taking steps toward achieving more independence by finding and maintaining employment  Assessment of progress:  progressing   Lonni Fischer, [place] [place]

## 2024-06-01 ENCOUNTER — Ambulatory Visit: Payer: MEDICAID | Admitting: Psychiatry

## 2024-06-01 ENCOUNTER — Ambulatory Visit: Payer: MEDICAID | Admitting: Mental Health

## 2024-06-01 DIAGNOSIS — F259 Schizoaffective disorder, unspecified: Secondary | ICD-10-CM | POA: Diagnosis not present

## 2024-06-01 NOTE — Progress Notes (Addendum)
 [facility] Psychotherapy Note  Name: Griselda Tosh Date:  06/01/24 MRN: [id number] DOB: Mar 17, 1996 PCP: Arloa Elsie SAUNDERS, MD  Time spent:  46 minutes  Treatment:  individual therapy  Virtual Visit via Telehealth Note Connected with patient by a telemedicine/telehealth application, with their informed consent, and verified patient privacy and that I am speaking with the correct person using two identifiers. I discussed the limitations, risks, security and privacy concerns of performing psychotherapy and the availability of in person appointments. I also discussed with the patient that there may be a patient responsible charge related to this service. The patient expressed understanding and agreed to proceed. I discussed the treatment planning with the patient. The patient was provided an opportunity to ask questions and all were answered. The patient agreed with the plan and demonstrated an understanding of the instructions. The patient was advised to call  our office if  symptoms worsen or feel they are in a crisis state and need immediate contact.   Therapist Location: office Patient Location: home   Mental Status Exam:    Appearance:    Casual     Behavior:   Appropriate  Motor:   WNL  Speech/Language:    Clear and Coherent  Affect:   Full range   Mood:   Euthymic, some sadness  Thought process:   normal  Thought content:     WNL  Sensory/Perceptual disturbances:     none  Orientation:   x4  Attention:   Good  Concentration:   Good  Memory:   Intact  Fund of knowledge:    Consistent with age and development  Insight:     Good  Judgment:    Good  Impulse Control:   Good   Reported Symptoms:  anxiety, depression, rumination, avoidant behavior (crowds at times)  Risk Assessment: Danger to Self:  No Self-injurious Behavior: No Danger to Others: Thoughts of harming others but denies any plan or intent to harm others or follow through meeting. Duty to  Warn:no Physical Aggression / Violence:No  Access to Firearms a concern: No  Gang Involvement:No  Patient / guardian was educated about steps to take if suicide or homicide risk level increases between visits: yes While future psychiatric events cannot be accurately predicted, the patient does not currently require acute inpatient psychiatric care and does not currently meet [place]  involuntary commitment criteria.  Diagnoses:    ICD-10-CM   1. Schizoaffective disorder, unspecified type (HCC)  F25.9       Subjective:  Patient engaged in telehealth session via video.  Patient shared recent events where he currently is staying with his grandfather.  He reviewed how he plans to stay with him about 4 days/week, that his grandfather will pay him a sum of money each week which will be helpful as patient continues to be unemployed.  Encouraged him to use this time to cope and care for himself, follow through with doctor appointments.  He stated that he also has continued to exercise and has experienced benefits, feeling better about himself and healthier as a result.  He identified continued to have some thoughts that might manifest related to being frustrated with others.  He stated that there might be moments, particularly online where he recognizes his tendency toward feeling aggressive.  He denies any plan or intent to harm anyone and further expressed insight about how he is working to pay attention to how he feels, review his thoughts as opposed to being reactive.  He feels his  medications have been helpful, plans to follow up with his appointment next week.  Encouraged him to ask any questions of his doctor as he had a few about efficacy.  He stated that he has received positive attention from others, when out social, or going to church as examples where he has made some new friends and expressed interest at some point in dating again.  He continues to report some anxiety in social settings such  as in grocery stores.  Facilitated his framing thoughts to cope in these experiences where he was able to identify I know people are not looking at me as he stated that he will often think that people are. Invited patient to contact our office between visits if needed, also to continue to utilize his support system which consists of friends and family.  Interventions:   Motivational interviewing, supportive therapy   Plan: Patient is to use CBT, mindfulness and coping skills to help decrease symptoms associated with their diagnosis.  Patient to continue to identify ways to cope and care for himself, attend doctor appointments about following through with his primary care physician as well as his psychiatry appointments.    Long-term goal:   Reduce overall level, frequency, and intensity of the feelings of anxiety in social situations for up to 3 months consecutively.   Decrease feelings of sadness and anxiety related to life stressors.   Short-term goal:  Patient to continue to work on identifying thoughts that make his anxiety increased in social situations and to continue to allow himself to engage in social situations to continue to try and work past his anxiety.   Patient to ground himself with rational, calming self talk when having intrusive thoughts. Increased feelings of self-worth by taking steps toward achieving more independence by finding and maintaining employment  Assessment of progress:  progressing   Lonni Fischer, [facility]

## 2024-06-22 ENCOUNTER — Ambulatory Visit (INDEPENDENT_AMBULATORY_CARE_PROVIDER_SITE_OTHER): Payer: MEDICAID | Admitting: Mental Health

## 2024-06-22 DIAGNOSIS — F259 Schizoaffective disorder, unspecified: Secondary | ICD-10-CM

## 2024-06-22 NOTE — Progress Notes (Signed)
 [facility] Psychotherapy Note  Name: Houa Ackert Date:  06/22/24 MRN: [id number] DOB: 1996-04-24 PCP: Arloa Elsie SAUNDERS, MD  Time spent:  47 minutes  Treatment:  individual therapy  Virtual Visit via Telehealth Note Connected with patient by a telemedicine/telehealth application, with their informed consent, and verified patient privacy and that I am speaking with the correct person using two identifiers. I discussed the limitations, risks, security and privacy concerns of performing psychotherapy and the availability of in person appointments. I also discussed with the patient that there may be a patient responsible charge related to this service. The patient expressed understanding and agreed to proceed. I discussed the treatment planning with the patient. The patient was provided an opportunity to ask questions and all were answered. The patient agreed with the plan and demonstrated an understanding of the instructions. The patient was advised to call  our office if  symptoms worsen or feel they are in a crisis state and need immediate contact.   Therapist Location: office Patient Location: home   Mental Status Exam:    Appearance:    Casual     Behavior:   Appropriate  Motor:   WNL  Speech/Language:    Clear and Coherent  Affect:   Full range   Mood:   Euthymic, some sadness  Thought process:   normal  Thought content:     WNL  Sensory/Perceptual disturbances:     none  Orientation:   x4  Attention:   Good  Concentration:   Good  Memory:   Intact  Fund of knowledge:    Consistent with age and development  Insight:     Good  Judgment:    Good  Impulse Control:   Good   Reported Symptoms:  anxiety, depression, rumination, avoidant behavior (crowds at times)  Risk Assessment: Danger to Self:  No Self-injurious Behavior: No Danger to Others: Thoughts of harming others but denies any plan or intent to harm others or follow through meeting. Duty to  Warn:no Physical Aggression / Violence:No  Access to Firearms a concern: No  Gang Involvement:No  Patient / guardian was educated about steps to take if suicide or homicide risk level increases between visits: yes While future psychiatric events cannot be accurately predicted, the patient does not currently require acute inpatient psychiatric care and does not currently meet [place]  involuntary commitment criteria.  Diagnoses:    ICD-10-CM   1. Schizoaffective disorder, unspecified type (HCC)  F25.9        Subjective:  Patient engaged in telehealth session via video.  He shared how he has returned to his parents going on to share how his grandfather made him leave his home due to his having a night where he consumed alcohol excessively.  He stated that he was drinking and was unaware of the impact the alcohol was having on him, he feels this is important related to his current medications.  He verbalized how he does not plan on drinking again due to the effects.  He went on to share He received his injection about 3 weeks ago, initially it being his previous prescription injection and was able to receive his current prescribed injection 1 week later.  He stated that he brought this air immediately to the attention of the nurse.  He stated since receiving his appropriate injection his symptoms have ameliorated.  He denies any thoughts of SI or HI.  He continues to share how he has no access to any weapons or guns  in the home.  He wants to continue to work on his self-care by eating healthy, working out and continuing to reclaim aspects of his social life.  He stated he has engaged in martial arts classes, shared a recent sparring session where he stated that he felt angry after another partner had been aggressive during the sparring session.  He went on to further explore in session situations where he feels that his anger has been an issue.  He further shared how he feels he has had a challenge  with dating, at times having a negative view of the girls he dates and related them back to the conflictual relationship he has had with his mother.  He stated they have not had a meaningful discussion about some of their past challenges, maintains there is a distance in the relationship that persist presently.  He reports that current family relationship dynamics are going well, feels closest with his father and gets along with his brothers.  Patient was encouraged to recognize his steps toward identifying and maintaining aspects of his self-care and treatment.  Expressed intention to continue with his med management appointments and hopes to find part-time work as he is applying for 2 different positions.   Interventions:   Motivational interviewing, supportive therapy   Plan: Patient is to use CBT, mindfulness and coping skills to help decrease symptoms associated with their diagnosis.  Patient to continue to identify ways to cope and care for himself, attend doctor appointments about following through with his primary care physician as well as his psychiatry appointments.    Long-term goal:   Reduce overall level, frequency, and intensity of the feelings of anxiety in social situations for up to 3 months consecutively.   Decrease feelings of sadness and anxiety related to life stressors.   Short-term goal:  Patient to continue to work on identifying thoughts that make his anxiety increased in social situations and to continue to allow himself to engage in social situations to continue to try and work past his anxiety.   Patient to ground himself with rational, calming self talk when having intrusive thoughts. Increased feelings of self-worth by taking steps toward achieving more independence by finding and maintaining employment  Assessment of progress:  progressing   Lonni Fischer, [facility]

## 2024-07-06 ENCOUNTER — Ambulatory Visit (INDEPENDENT_AMBULATORY_CARE_PROVIDER_SITE_OTHER): Payer: MEDICAID | Admitting: Mental Health

## 2024-07-06 DIAGNOSIS — F259 Schizoaffective disorder, unspecified: Secondary | ICD-10-CM | POA: Diagnosis not present

## 2024-07-06 NOTE — Progress Notes (Signed)
 [facility] Psychotherapy Note  Name: Scott Vargas Date: 9/9//25 MRN: [id number] DOB: 1996-01-10 PCP: Arloa Elsie SAUNDERS, MD  Time spent:  44 minutes  Treatment:  individual therapy  Virtual Visit via Telehealth Note Connected with patient by a telemedicine/telehealth application, with their informed consent, and verified patient privacy and that I am speaking with the correct person using two identifiers. I discussed the limitations, risks, security and privacy concerns of performing psychotherapy and the availability of in person appointments. I also discussed with the patient that there may be a patient responsible charge related to this service. The patient expressed understanding and agreed to proceed. I discussed the treatment planning with the patient. The patient was provided an opportunity to ask questions and all were answered. The patient agreed with the plan and demonstrated an understanding of the instructions. The patient was advised to call  our office if  symptoms worsen or feel they are in a crisis state and need immediate contact.   Therapist Location: office Patient Location: home   Mental Status Exam:    Appearance:    Casual     Behavior:   Appropriate  Motor:   WNL  Speech/Language:    Clear and Coherent  Affect:   Full range   Mood:   Euthymic, some sadness  Thought process:   normal  Thought content:     WNL  Sensory/Perceptual disturbances:     none  Orientation:   x4  Attention:   Good  Concentration:   Good  Memory:   Intact  Fund of knowledge:    Consistent with age and development  Insight:     Good  Judgment:    Good  Impulse Control:   Good   Reported Symptoms:  anxiety, depression, rumination, avoidant behavior (crowds at times)  Risk Assessment: Danger to Self:  No Self-injurious Behavior: No Danger to Others: Thoughts of harming others but denies any plan or intent to harm others or follow through meeting. Duty to Warn:no Physical  Aggression / Violence:No  Access to Firearms a concern: No  Gang Involvement:No  Patient / guardian was educated about steps to take if suicide or homicide risk level increases between visits: yes While future psychiatric events cannot be accurately predicted, the patient does not currently require acute inpatient psychiatric care and does not currently meet [place]  involuntary commitment criteria.  Diagnoses:    ICD-10-CM   1. Schizoaffective disorder, unspecified type (HCC)  F25.9         Subjective:  Patient engaged in telehealth session via video.  Assessed recent events, progress.  He stated that his mood has been more subdued, relaxed over the last 2 to 3 weeks.  He stated that he is consistent with his injections from his psychiatrist and feels the medication has been helpful.  He stated that he copes with pressure headaches but his prescribed Klonopin  has been helpful which he takes most days. Explored outlets for self-care and health where he stated he continues his exercise regimen and martial arts classes he attends about 3 times per month.  He stated that he recently joined a Schizophrenia support group in [place], [place]- through the [employer] and has attended 1 group thus far and has found it beneficial. He notified the need to distance himself from certain churches once he identified as Copy churches.  He stated that he feels it would be better to attend a different church and has done so with his mother recently.  He stated that he did have some interest in a girl that he is known for a while but he stated that she often would not reciprocate therefore leaving him upset.  He stated that he did make a negative comment towards her online, resulting in his getting banned from the social media app.  He stated that he called her a derogatory name but did not make any threats toward her.  He expressed insight into how he should have handled the situation  differently and how he wants to refrain from getting upset particularly in the evenings at night where he may have a tendency to post comments online, often deleting them.  Discussed STOPP skill to be utilized as a way to set a interpersonal boundary for himself.    Interventions:   Motivational interviewing, supportive therapy   Plan: Patient is to use CBT, mindfulness and coping skills to help decrease symptoms associated with their diagnosis.  Patient to continue to identify ways to cope and care for himself, attend doctor appointments about following through with his primary care physician as well as his psychiatry appointments.    Long-term goal:   Reduce overall level, frequency, and intensity of the feelings of anxiety in social situations for up to 3 months consecutively.   Decrease feelings of sadness and anxiety related to life stressors.   Short-term goal:  Patient to continue to work on identifying thoughts that make his anxiety increased in social situations and to continue to allow himself to engage in social situations to continue to try and work past his anxiety.   Patient to ground himself with rational, calming self talk when having intrusive thoughts. Increased feelings of self-worth by taking steps toward achieving more independence by finding and maintaining employment  Assessment of progress:  progressing   Lonni Fischer, [facility]

## 2024-07-27 ENCOUNTER — Ambulatory Visit: Payer: MEDICAID | Admitting: Mental Health

## 2024-08-10 ENCOUNTER — Ambulatory Visit: Payer: MEDICAID | Admitting: Mental Health

## 2024-08-10 DIAGNOSIS — F259 Schizoaffective disorder, unspecified: Secondary | ICD-10-CM | POA: Diagnosis not present

## 2024-08-10 NOTE — Progress Notes (Signed)
 [facility] Psychotherapy Note  Name: Scott Vargas Date:  08/10/24 MRN: [id number] DOB: [birth year] PCP: Arloa Elsie SAUNDERS, MD  Time spent:  45 minutes  Treatment:  individual therapy  Virtual Visit via Telehealth Note Connected with patient by a telemedicine/telehealth application, with their informed consent, and verified patient privacy and that I am speaking with the correct person using two identifiers. I discussed the limitations, risks, security and privacy concerns of performing psychotherapy and the availability of in person appointments. I also discussed with the patient that there may be a patient responsible charge related to this service. The patient expressed understanding and agreed to proceed. I discussed the treatment planning with the patient. The patient was provided an opportunity to ask questions and all were answered. The patient agreed with the plan and demonstrated an understanding of the instructions. The patient was advised to call  our office if  symptoms worsen or feel they are in a crisis state and need immediate contact.   Therapist Location: office Patient Location: home   Mental Status Exam:    Appearance:    Casual     Behavior:   Appropriate  Motor:   WNL  Speech/Language:    Clear and Coherent  Affect:   Full range   Mood:   Euthymic, some sadness  Thought process:   normal  Thought content:     WNL  Sensory/Perceptual disturbances:     none  Orientation:   x4  Attention:   Good  Concentration:   Good  Memory:   Intact  Fund of knowledge:    Consistent with age and development  Insight:     Good  Judgment:    Good  Impulse Control:   Good   Reported Symptoms:  anxiety, depression, rumination, avoidant behavior (crowds at times)  Risk Assessment: Danger to Self:  No Self-injurious Behavior: No Danger to Others: Thoughts of harming others but denies any plan or intent to harm others or follow through meeting. Duty to  Warn:no Physical Aggression / Violence:No  Access to Firearms a concern: No  Gang Involvement:No  Patient / guardian was educated about steps to take if suicide or homicide risk level increases between visits: yes While future psychiatric events cannot be accurately predicted, the patient does not currently require acute inpatient psychiatric care and does not currently meet [place]  involuntary commitment criteria.  Diagnoses:    ICD-10-CM   1. Schizoaffective disorder, unspecified type (HCC)  F25.9          Subjective:  Patient engaged in telehealth session via video.  Assessed progress since last session.  He shared how he continues to see his psychiatrist and has any medication changes.  He continues to feel more stable but reports some ongoing moments with agitation.  He denies any plan or intent to harm anyone, more just noticed that his mood can be high and sometimes low.  He stated when he is feeling more elevated, this is when he can have increased agitation.  He is trying to continue to use healthy outlets such as continuing to engage in his martial arts class and also workouts.  Explored other ways to cope, outlets to destress where he enjoys listening to music which he states can be calming.  Encouraged him to consider other ways to ground himself such as going for walks.  He stated he has been attending [employer] which is a community support group focused on mental health which he has found helpful and enjoyable.  He continues to try to make time for his social life, spending time with a few friends.  He continues to express wanting to date and expresses how he tries to remain optimistic but also not let this be a source of self-imposed pressure.  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  He shared how he is hopeful that this job could be offered and if given the opportunity it would  help him financially.     Interventions:   Motivational interviewing, supportive therapy   Plan: Patient is to use CBT, mindfulness and coping skills to help decrease symptoms associated with their diagnosis.  Patient to continue to identify ways to cope and care for himself, attend doctor appointments about following through with his primary care physician as well as his psychiatry appointments.    Long-term goal:   Reduce overall level, frequency, and intensity of the feelings of anxiety in social situations for up to 3 months consecutively.   Decrease feelings of sadness and anxiety related to life stressors.   Short-term goal:  Patient to continue to work on identifying thoughts that make his anxiety increased in social situations and to continue to allow himself to engage in social situations to continue to try and work past his anxiety.   Patient to ground himself with rational, calming self talk when having intrusive thoughts. Increased feelings of self-worth by taking steps toward achieving more independence by finding and maintaining employment  Assessment of progress:  progressing   Lonni Fischer, [facility]

## 2024-08-22 NOTE — ED Triage Notes (Signed)
 Pt to ED with reports of headache on and off for the last five years and neck pain. Pt reports the headache is worsening and neck pain began Monday.

## 2024-08-24 ENCOUNTER — Ambulatory Visit: Payer: MEDICAID | Admitting: Mental Health

## 2024-08-24 DIAGNOSIS — F259 Schizoaffective disorder, unspecified: Secondary | ICD-10-CM | POA: Diagnosis not present

## 2024-08-24 NOTE — Progress Notes (Signed)
 [facility] Psychotherapy Note  Name: Louie Flenner Date:  08/24/24 MRN: [id number] DOB: 1996-10-26 PCP: Arloa Elsie SAUNDERS, MD  Time spent:  46 minutes  Treatment:  individual therapy  Virtual Visit via Telehealth Note Connected with patient by a telemedicine/telehealth application, with their informed consent, and verified patient privacy and that I am speaking with the correct person using two identifiers. I discussed the limitations, risks, security and privacy concerns of performing psychotherapy and the availability of in person appointments. I also discussed with the patient that there may be a patient responsible charge related to this service. The patient expressed understanding and agreed to proceed. I discussed the treatment planning with the patient. The patient was provided an opportunity to ask questions and all were answered. The patient agreed with the plan and demonstrated an understanding of the instructions. The patient was advised to call  our office if  symptoms worsen or feel they are in a crisis state and need immediate contact.   Therapist Location: office Patient Location: home   Mental Status Exam:    Appearance:    Casual     Behavior:   Appropriate  Motor:   WNL  Speech/Language:    Clear and Coherent  Affect:   Full range   Mood:   Some anxiety, pleasant  Thought process:   Logical, linear  Thought content:     WNL  Sensory/Perceptual disturbances:     none  Orientation:   x4  Attention:   Good  Concentration:   Good  Memory:   Intact  Fund of knowledge:    Consistent with age and development  Insight:     Good  Judgment:    Good  Impulse Control:   Good   Reported Symptoms:  anxiety, depression, rumination, avoidant behavior (crowds at times), intrusive thoughts  Risk Assessment: Danger to Self:  No Self-injurious Behavior: No Danger to Others: Thoughts of harming others but denies any plan or intent to harm others or follow through  meeting. Duty to Warn:no Physical Aggression / Violence:No  Access to Firearms a concern: No  Gang Involvement:No  Patient / guardian was educated about steps to take if suicide or homicide risk level increases between visits: yes While future psychiatric events cannot be accurately predicted, the patient does not currently require acute inpatient psychiatric care and does not currently meet [place]  involuntary commitment criteria.  Diagnoses:    ICD-10-CM   1. Schizoaffective disorder, unspecified type (HCC)  F25.9         Subjective:  Patient engaged in telehealth session via video.  He stated he had a lot of changes over the last 2 weeks.  He stated that he was coping with neck pain along with having a more severe headache.  He stated that he continues to cope with some anger issues.  Stated that he went online and made a comment to someone at his previous church.  He expressed regret about this decision, how he could have handled the situation differently, not making any comments online.  He stated that he plans to avoid social media.  He shared how he did not feel accepted at that church, going there and attending a few services a few months ago.  Since that time, he continues to go to church with his mother which she feels is a better fit for him.  Explored ways to cope, some specific emotional regulatory strategies toward recognizing his emotions especially when distressed.  He stated he decided to  try THC Gummies recently as he wanted increased energy and focus.  He stated that he did not have a positive response, feeling somewhat dissociated with more head pressure.  He stated that he no longer has them and does not plan to continue to take them.  We cautioned him about using any other illicit substances and how he can discuss further with his psychiatric provider.  He stated that he has a history of having head pressure on several occasions in the past over the years, also noting  that he has had neck pain where he feels that his neck has popped also occurring multiple times over the years.  He stated he followed up with a visit to [facility] where he was evaluated and referred to neurology; He plans to follow-up with this appointment.  He stated he has been consistent in taking his psychiatric medications, this was encouraged and reviewed with him office availability including after hours.  He stated that he is aware and also with his current psychiatric provider's office at [facility] which provides 24/7 support.  He stated that family relationships are going well overall, but his parents are aware of his recent use of THC and have shared their expectations of his discontinuing use; he expressed agreement.  He denies any plan to harm himself or others, plans to follow-up with his psychiatric appointment in about 2 weeks.    Interventions:   Motivational interviewing, supportive therapy   Plan: Patient is to use CBT, mindfulness and coping skills to help decrease symptoms associated with their diagnosis.  Patient to continue to identify ways to cope and care for himself, attend doctor appointments about following through with his primary care physician as well as his psychiatry appointments.    Long-term goal:   Reduce overall level, frequency, and intensity of the feelings of anxiety in social situations for up to 3 months consecutively.   Decrease feelings of sadness and anxiety related to life stressors.   Short-term goal:  Patient to continue to work on identifying thoughts that make his anxiety increased in social situations and to continue to allow himself to engage in social situations to continue to try and work past his anxiety.   Patient to ground himself with rational, calming self talk when having intrusive thoughts. Increased feelings of self-worth by understanding and accepting his mental health challenges and  life changes  Assessment of progress:  progressing   Lonni Fischer, [facility]

## 2024-08-27 ENCOUNTER — Encounter (HOSPITAL_COMMUNITY): Payer: Self-pay

## 2024-08-27 ENCOUNTER — Ambulatory Visit (HOSPITAL_COMMUNITY)
Admission: EM | Admit: 2024-08-27 | Discharge: 2024-08-27 | Disposition: A | Discharge status: Home / Self Care | Payer: MEDICAID | Attending: Behavioral Health | Admitting: Behavioral Health

## 2024-08-27 ENCOUNTER — Ambulatory Visit (HOSPITAL_COMMUNITY)
Admission: EM | Admit: 2024-08-27 | Discharge: 2024-08-27 | Disposition: A | Discharge status: Home / Self Care | Payer: MEDICAID | Attending: Emergency Medicine | Admitting: Emergency Medicine

## 2024-08-27 DIAGNOSIS — Z560 Unemployment, unspecified: Secondary | ICD-10-CM | POA: Insufficient documentation

## 2024-08-27 DIAGNOSIS — G43009 Migraine without aura, not intractable, without status migrainosus: Secondary | ICD-10-CM | POA: Diagnosis present

## 2024-08-27 DIAGNOSIS — Z113 Encounter for screening for infections with a predominantly sexual mode of transmission: Secondary | ICD-10-CM | POA: Diagnosis present

## 2024-08-27 DIAGNOSIS — Z79899 Other long term (current) drug therapy: Secondary | ICD-10-CM | POA: Insufficient documentation

## 2024-08-27 DIAGNOSIS — F200 Paranoid schizophrenia: Secondary | ICD-10-CM | POA: Insufficient documentation

## 2024-08-27 DIAGNOSIS — G43909 Migraine, unspecified, not intractable, without status migrainosus: Secondary | ICD-10-CM | POA: Insufficient documentation

## 2024-08-27 HISTORY — DX: Schizoaffective disorder, unspecified: F25.9

## 2024-08-27 LAB — HIV ANTIBODY (ROUTINE TESTING W REFLEX): HIV Screen 4th Generation wRfx: NONREACTIVE

## 2024-08-27 MED ORDER — IBUPROFEN 800 MG PO TABS
ORAL_TABLET | ORAL | Status: AC
  Filled 2024-08-27: qty 1

## 2024-08-27 MED ORDER — IBUPROFEN 600 MG PO TABS: 600.0000 mg | ORAL_TABLET | Freq: Three times a day (TID) | ORAL | 0 refills | Status: AC | PRN

## 2024-08-27 MED ORDER — IBUPROFEN 800 MG PO TABS: 800.0000 mg | ORAL_TABLET | Freq: Once | ORAL | Status: DC

## 2024-08-27 NOTE — Discharge Instructions (Addendum)
 Discharge recommendations:   Medications: Patient is to take medications as prescribed. No medication changes were made during your visit today. The patient or patient's guardian is to contact a medical professional and/or outpatient provider to address any new side effects that develop. The patient or the patient's guardian should update outpatient providers of any new medications and/or medication changes.   Outpatient Follow up: Please follow up with [facility] for medication management. Please follow up with your outpatient PCP at [facility] or go to the nearest urgent care or ED for medical evaluation.   [facility], [place] Address: [address], [place], [place] [address] Phone: [phone]  [facility] Address: [address] [place], [place], [place] [address] Phone: [phone]  Therapy: We recommend that patient participate in individual therapy to address mental health concerns. Please follow up with [facility] for counseling.   [facility] Address: [address], [place], [place] [address] Phone: [phone]  Atypical antipsychotics: If you are prescribed an atypical antipsychotic, it is recommended that your height, weight, BMI, blood pressure, fasting lipid panel, and fasting blood sugar be monitored by your outpatient providers.  Safety:   The following safety precautions should be taken:   No sharp objects. This includes scissors, razors, scrapers, and putty knives.   Chemicals should be removed and locked up.   Medications should be removed and locked up.   Weapons should be removed and locked up. This includes firearms, knives and instruments that can be used to cause injury.   The patient should abstain from use of illicit substances/drugs and abuse of any medications.  If symptoms worsen or do not continue to improve or if the  patient becomes actively suicidal or homicidal then it is recommended that the patient return to the closest hospital emergency department, the [facility], or call 911 for further evaluation and treatment. National Suicide Prevention Lifeline 1-800-SUICIDE or 337-044-2830.  About 988 988 offers 24/7 access to trained crisis counselors who can help people experiencing mental health-related distress. People can call or text 988 or chat 988lifeline.org for themselves or if they are worried about a loved one who may need crisis support.

## 2024-08-27 NOTE — ED Provider Notes (Signed)
 Behavioral Health Urgent Care Medical Screening Exam  Patient Name: Scott Vargas MRN: [id number] Date of Evaluation: 08/27/24 Chief Complaint:  Per Triage, Pt Scott Vargas is a 1Y male presenting to [facility] as a vol walk-in. Pt states he is here today because he wanted an assessment for his mental health and because he wants his blood work to be checked. Pt has a hx of Schizophrenia and is taking medication. Pt denies SI, HI, and substance use. Pt endorses AH as he hears.  Diagnosis:  Final diagnoses:  Paranoid schizophrenia (HCC)    History of Present illness: Scott Vargas is a 28 y.o. male patient with a past psychiatric history significant for schizoaffective disorder and paranoid schizophrenia who presented to the [facility] voluntary unaccompanied requesting a mental health exam and blood work.  Patient states that he has been experiencing weird thoughts and wanted to know if he is having delusional thinking because he has been thinking about friends from his childhood and for some reason he cannot get them out of his mind. He states that he has also been posting random things on Facebook and that a lot of his friends have unfollowed him. He does not elaborate on the random things that he has been posting on [employer]. When asked if he has made suicidal or homicidal statements, he states no. He denies suicidal or homicidal thoughts. He denies visual hallucinations. He endorses auditory hallucinations of hearing positive, negative for neutral things. He reports experiencing auditory hallucinations for 5 years since his mental health got worse and he was diagnosed with schizophrenia. He does not appear to be responding to internal or external stimuli on exam. He describes his mood as feeling a little depressed and endorses depressive symptoms of feeling like he does not have purpose and feeling stuck because he doesn't having anything going on in his life and he's  not able to work due to trying to get disability for his schizophrenia. He reports fair sleep. He reports a normal appetite. He reports outpatient psychiatry at [facility] with May Coleman and states that he is prescribed Klonopin  0.5 mg BID, olanzapine was increased today to 10 mg at bedtime, Abilify  injection next dose due November 12 and trazodone  for sleep. He reports medication compliance. He reports outpatient [facility] with Prentice Bruckner. He states that he lives with his parents in [place] and will also go stay with his grandfather in [place] . He is unemployed and is seeking disability. He denies drinking alcohol and states that he does not like to mix alcohol with taking his medications. He reports taking THC edibles 1 week ago. He denies using other illicit drugs.  He complains of having migraines and states that he has an appointment with a headache specialist at [facility] next week. He denies taking medications for migraines. He is requesting blood work and STD screening. Patient was advised to follow-up with his outpatient PCP or go to the closest medical urgent care. Patient does not appear to be in acute distress or have any acute medical complaints at this time that warrants emergent medical treatment.  Plan of care: I offered to contact the patient's parents to safety plan and obtain additional information. However he declined, I discussed with the patient to follow-up with his outpatient psychiatrist for medication management and counselor for therapy to address theatric concerns. Safety planning completed prior to discharge. Patient does not appear to be at imminent risk of dangerousness to self or others  at this time. While future psychiatric events cannot be accurately predicted, the patient does not necessitate nor desire further acute inpatient psychiatric care at this time. The patient does not meet [place]  involuntary commitment criteria  at this time.    Flowsheet Row ED from 08/27/2024 in [facility] Most recent reading at 08/27/2024  2:42 PM Ad[facility] (Discharged) [facility] Most recent reading at 05/07/2024 10:46 PM ED from 05/07/2024 in [facility] Most recent reading at 05/07/2024  1:00 PM  C-SSRS RISK CATEGORY No Risk No Risk No Risk    Psychiatric Specialty Exam  Presentation  General Appearance:Appropriate for Environment  Eye Contact:Fair  Speech:Clear and Coherent  Speech Volume:Normal  Handedness:Right   Mood and Affect  Mood: Anxious  Affect: Congruent   Thought Process  Thought Processes: Coherent  Descriptions of Associations:Circumstantial  Orientation:Full (Time, Place and Person)  Thought Content:Logical; Rumination  Diagnosis of Schizophrenia or Schizoaffective disorder in past: Yes  Duration of Psychotic Symptoms: Greater than six months.    Hallucinations:Auditory  Ideas of Reference:None  Suicidal Thoughts:No  Homicidal Thoughts:No   Sensorium  Memory: Immediate Fair; Recent Fair; Remote Fair  Judgment: Intact  Insight: Present   Executive Functions  Concentration: Fair  Attention Span: Fair  Recall: [employer] of Knowledge: Fair  Language: Fair   Psychomotor Activity  Psychomotor Activity: Normal   Assets  Assets: Manufacturing Systems Engineer; Desire for Improvement; Housing; Leisure Time; Social Support   Sleep  Sleep: Fair   Physical Exam: Physical Exam Cardiovascular:     Rate and Rhythm: Normal rate.  Pulmonary:     Effort: Pulmonary effort is normal.  Musculoskeletal:     Cervical back: Normal range of motion.  Neurological:     Mental Status: He is alert and oriented to person, place, and time.    Review of Systems  Constitutional: Negative.   HENT: Negative.    Respiratory: Negative.    Cardiovascular: Negative.    Musculoskeletal: Negative.   Neurological: Negative.   Psychiatric/Behavioral:  Positive for hallucinations.    Musculoskeletal: Strength & Muscle Tone: within normal limits Gait & Station: normal Patient leans: N/A   [facility] MSE Discharge Disposition for Follow up and Recommendations: Based on my evaluation the patient does not appear to have an emergency medical condition and can be discharged with resources and follow up care in outpatient services for Medication Management and Individual Therapy  Discharge recommendations:   Medications: Patient is to take medications as prescribed. No medication changes were made during your visit today. The patient or patient's guardian is to contact a medical professional and/or outpatient provider to address any new side effects that develop. The patient or the patient's guardian should update outpatient providers of any new medications and/or medication changes.   Outpatient Follow up: Please follow up with [facility] for medication management. Please follow up with your outpatient PCP at [facility] or go to the nearest urgent care or ED for medical evaluation.   [facility], [place] Address: [address], [place], [place] [address] Phone: [phone]  [facility] Address: [address] [place], [place], [place] [address] Phone: [phone]  Therapy: We recommend that patient participate in individual therapy to address mental health concerns. Please follow up with [facility] for counseling.   [facility] Address: [address], [place],  [place] [address] Phone: [phone]  Atypical antipsychotics: If you are prescribed an atypical antipsychotic, it is recommended that your height, weight, BMI, blood pressure, fasting lipid panel, and fasting blood sugar be monitored by your outpatient providers.  Safety:    The following safety precautions should be taken:   No sharp objects. This includes scissors, razors, scrapers, and putty knives.   Chemicals should be removed and locked up.   Medications should be removed and locked up.   Weapons should be removed and locked up. This includes firearms, knives and instruments that can be used to cause injury.   The patient should abstain from use of illicit substances/drugs and abuse of any medications.  If symptoms worsen or do not continue to improve or if the patient becomes actively suicidal or homicidal then it is recommended that the patient return to the closest hospital emergency department, the [facility], or call 911 for further evaluation and treatment. National Suicide Prevention Lifeline 1-800-SUICIDE or 364-393-9797.  About 988 988 offers 24/7 access to trained crisis counselors who can help people experiencing mental health-related distress. People can call or text 988 or chat 988lifeline.org for themselves or if they are worried about a loved one who may need crisis support.    Luiscarlos Kaczmarczyk L, NP 08/27/2024, 4:09 PM

## 2024-08-27 NOTE — ED Notes (Signed)
 Patient A&O x 4, ambulatory. Patient discharged in no acute distress. Patient denied SI/HI, A/VH upon discharge. Patient verbalized understanding of all discharge instructions reviewed on AVS via staff, to include follow up appointments, RX's and safety. Suicide safety plan completed and reviewed with clinical research associate. A copy given to pt.  Patient escorted to lobby via staff for transport to destination. Safety maintained.

## 2024-08-27 NOTE — Progress Notes (Signed)
   08/27/24 1438  [facility] Triage Screening (Walk-ins at [facility] only)  How Did You Hear About Us ? Family/Friend  What Is the Reason for Your Visit/Call Today? Pt Scott Vargas is a 66Y male presenting to [facility] as a vol walk-in. Pt states he is here today because he wanted an assessment for his mental health and because he wants his blood work to be checked. Pt has a hx of Schizophrenia and is taking medication. Pt denies SI, HI, and substance use. Pt endorses AH as he hears voices.  How Long Has This Been Causing You Problems? > than 6 months  Have You Recently Had Any Thoughts About Hurting Yourself? No  Are You Planning to Commit Suicide/Harm Yourself At This time? No  Have you Recently Had Thoughts About Hurting Someone [name]? No  Are You Planning To Harm Someone At This Time? No  Are you currently experiencing any auditory, visual or other hallucinations? Yes  Please explain the hallucinations you are currently experiencing: hears voices  Have You Used Any Alcohol or Drugs in the Past 24 Hours? No  What Do You Feel Would Help You the Most Today? Treatment for Depression or other mood problem  Determination of Need Routine (7 days)  Options For Referral [facility];Outpatient Therapy

## 2024-08-27 NOTE — Discharge Instructions (Addendum)
 Please continue ibuprofen every 6-8 hours as needed for headache.  Your next dose, if needed, can be taken around 11 PM tonight.  The results of your STD testing today which screens for gonorrhea, chlamydia, and trichomonas will be posted to your MyChart account once it is complete.  This typically takes 2 to 4 days.  Please abstain from sexual intercourse of any kind, vaginal, oral or anal, until you have received the results of your STD testing.      The results of your HIV and syphilis blood tests will be made available to you once they are complete.  They will initially be posted to your MyChart account which typically takes 2 to 3 days.      If any of your results are abnormal, you will receive a phone call regarding treatment.  Prescriptions, if any are needed, will be provided for you at your pharmacy.      Thank you for visiting [facility] today.  We appreciate the opportunity to participate in your care.

## 2024-08-27 NOTE — ED Provider Notes (Signed)
 [facility]    CSN: [id number] Arrival date & time: 08/27/24  1615    HISTORY   Chief Complaint  Patient presents with   Migraine   SEXUALLY TRANSMITTED DISEASE    Screening    HPI Scott Vargas is a pleasant, 28 y.o. male who presents to urgent care today. Patient complains of a recurrent headache today.  States he had similar headaches on and off for the past 5 years.  States that he was seen in the emergency department 5 days ago and given Compazine and Benadryl  for the headache.  Patient states this partially resolved the headache but it is back again.  Patient states that headache is across the front of his forehead, describes the pain as a gripping, stinging pressure.  Patient denies dizziness, vision changes.  Patient is also requesting routine STD screening, denies symptoms of or known exposure to STD.  The history is provided by the patient.    Past Medical History:  Diagnosis Date   Diabetes mellitus without complication (HCC)    Migraine    Schizoaffective disorder, unspecified (HCC)    Patient Active Problem List   Diagnosis Date Noted   Paranoid schizophrenia (HCC) 05/10/2024   History reviewed. No pertinent surgical history.  Home Medications    Prior to Admission medications   Medication Sig Start Date End Date Taking? Authorizing Provider  amLODipine  (NORVASC ) 5 MG tablet Take 5 mg by mouth daily.   Yes [provider]  ARIPiprazole  ER (ABILIFY  MAINTENA) 400 MG SRER injection Inject 2 mLs (400 mg total) into the muscle every 28 (twenty-eight) days. 06/10/24  Yes IzediunoJerrell LABOR, MD  buPROPion  (WELLBUTRIN  XL) 300 MG 24 hr tablet Take 300 mg by mouth every morning. 05/18/24  Yes [provider]  clonazePAM  (KLONOPIN ) 0.5 MG tablet Take 0.5 mg by mouth 2 (two) times daily as needed. 08/16/24  Yes [provider]  hydrOXYzine  (ATARAX ) 50 MG tablet Take 50 mg by mouth as needed.   Yes [provider]  ibuprofen  (ADVIL) 600 MG tablet Take 1 tablet (600 mg total) by mouth every 8 (eight) hours as needed for up to 30 doses for fever, headache, mild pain (pain score 1-3) or moderate pain (pain score 4-6) (Inflammation). Take 1 tablet 3 times daily as needed for inflammation of upper airways and/or pain. 08/27/24  Yes Joesph Shaver Scales, PA-C  lamoTRIgine  (LAMICTAL  XR) 100 MG 24 hour tablet Take 100 mg by mouth at bedtime. 08/16/24  Yes [provider]  metFORMIN  (GLUCOPHAGE ) 500 MG tablet Take 500 mg by mouth daily.   Yes [provider]  triamcinolone  cream (KENALOG ) 0.1 % Apply 1 Application topically daily as needed (Apply to affected area).   Yes [provider]    Family History History reviewed. No pertinent family history. Social History Social History   Tobacco Use   Smoking status: Light Smoker    Types: Cigars   Smokeless tobacco: Never  Substance Use Topics   Alcohol use: Yes   Drug use: Yes    Types: Marijuana   Allergies   Patient has no known allergies.  Review of Systems Review of Systems Pertinent findings revealed after performing a 14 point review of systems has been noted in the history of present illness.  Physical Exam Vital Signs BP (!) 145/88 (BP Location: Right Arm)   Pulse 89   Temp 97.8 F (36.6 C) (Oral)   Resp 18   Ht 5' 10 (1.778 m)  Wt 210 lb (95.3 kg)   SpO2 95%   BMI 30.13 kg/m   No data found.  Physical Exam Vitals and nursing note reviewed.  Constitutional:      General: He is awake. He is not in acute distress.    Appearance: Normal appearance. He is well-developed and well-groomed. He is not ill-appearing.  HENT:     Head: Normocephalic and atraumatic.     Salivary Glands: Right salivary gland is not diffusely enlarged or tender. Left salivary gland is not diffusely enlarged or tender.     Right Ear: Hearing, tympanic membrane, ear canal and external ear normal.     Left Ear: Hearing, tympanic membrane, ear  canal and external ear normal.     Nose: Nose normal.     Right Turbinates: Not enlarged, swollen or pale.     Left Turbinates: Not enlarged, swollen or pale.     Right Sinus: No maxillary sinus tenderness or frontal sinus tenderness.     Left Sinus: No maxillary sinus tenderness or frontal sinus tenderness.     Mouth/Throat:     Lips: Pink. No lesions.     Mouth: Mucous membranes are moist. No oral lesions.     Tongue: No lesions. Tongue does not deviate from midline.     Palate: No mass and lesions.     Pharynx: Oropharynx is clear. Uvula midline. No pharyngeal swelling, oropharyngeal exudate, posterior oropharyngeal erythema, uvula swelling or postnasal drip.     Tonsils: No tonsillar exudate. 0 on the right. 0 on the left.  Eyes:     General: Lids are normal.        Right eye: No discharge.        Left eye: No discharge.     Conjunctiva/sclera: Conjunctivae normal.     Right eye: Right conjunctiva is not injected.     Left eye: Left conjunctiva is not injected.  Neck:     Trachea: Trachea and phonation normal.  Cardiovascular:     Rate and Rhythm: Normal rate and regular rhythm.  Pulmonary:     Effort: Pulmonary effort is normal.     Breath sounds: Normal breath sounds.  Chest:     Chest wall: No tenderness.  Musculoskeletal:        General: Normal range of motion.     Cervical back: Full passive range of motion without pain, normal range of motion and neck supple. Normal range of motion.  Lymphadenopathy:     Cervical: No cervical adenopathy.  Skin:    General: Skin is warm and dry.     Findings: No erythema or rash.  Neurological:     General: No focal deficit present.     Mental Status: He is alert and oriented to person, place, and time. Mental status is at baseline.  Psychiatric:        Attention and Perception: Attention and perception normal.        Mood and Affect: Mood and affect normal.        Speech: Speech normal.        Behavior: Behavior normal. Behavior  is cooperative.        Thought Content: Thought content normal.        Cognition and Memory: Cognition and memory normal.        Judgment: Judgment normal.     Visual Acuity Right Eye Distance:   Left Eye Distance:   Bilateral Distance:    Right Eye Near:   Left  Eye Near:    Bilateral Near:     UC Couse / Diagnostics / Procedures:     Radiology No results found.  Procedures Procedures (including critical care time) EKG  Pending results:  Labs Reviewed  RPR  HIV ANTIBODY (ROUTINE TESTING W REFLEX)  CYTOLOGY, (ORAL, ANAL, URETHRAL) ANCILLARY ONLY    Medications Ordered in UC: Medications  ibuprofen (ADVIL) tablet 800 mg (has no administration in time range)    UC Diagnoses / Final Clinical Impressions(s)   I have reviewed the triage vital signs and the nursing notes.  Pertinent labs & imaging results that were available during my care of the patient were reviewed by me and considered in my medical decision making (see chart for details).    Final diagnoses:  Migraine without aura and without status migrainosus, not intractable  Screening examination for STD (sexually transmitted disease)  Based on history provided by patient and physical exam findings, do not believe patient is suffering from migraines at this time.When asked, patient states he has not tried taking ibuprofen for his headache.  Patient provided with ibuprofen 800 mg during his visit today along with a prescription for ibuprofen 600 mg that he can take every 6-8 hours as needed for recurrent headache.   STD screening was performed, patient advised that the results be posted to their MyChart and if any of the results are positive, they will be notified by phone, further treatment will be provided as indicated based on results of STD screening. Patient was advised to abstain from sexual intercourse until that they receive the results of their STD testing.  Patient was also advised to use condoms to protect  themselves from STD exposure. Return precautions advised.  Drug allergies reviewed, all questions addressed.   Please see discharge instructions below for details of plan of care as provided to patient. ED Prescriptions     Medication Sig Dispense Auth. Provider   ibuprofen (ADVIL) 600 MG tablet Take 1 tablet (600 mg total) by mouth every 8 (eight) hours as needed for up to 30 doses for fever, headache, mild pain (pain score 1-3) or moderate pain (pain score 4-6) (Inflammation). Take 1 tablet 3 times daily as needed for inflammation of upper airways and/or pain. 30 tablet Joesph Shaver Scales, PA-C      PDMP not reviewed this encounter.  Disposition Upon Discharge:  Condition: stable for discharge home  Patient presented with concern for an acute illness with associated systemic symptoms and significant discomfort requiring urgent management. In my opinion, this is a condition that a prudent lay person (someone who possesses an average knowledge of health and medicine) may potentially expect to result in complications if not addressed urgently such as respiratory distress, impairment of bodily function or dysfunction of bodily organs.   As such, the patient has been evaluated and assessed, work-up was performed and treatment was provided in alignment with urgent care protocols and evidence based medicine.  Patient/parent/caregiver has been advised that the patient may require follow up for further testing and/or treatment if the symptoms continue in spite of treatment, as clinically indicated and appropriate.  Routine symptom specific, illness specific and/or disease specific instructions were discussed with the patient and/or caregiver at length.  Prevention strategies for avoiding STD exposure were also discussed.  The patient will follow up with their current PCP if and as advised. If the patient does not currently have a PCP we will assist them in obtaining one.   The patient may need  specialty follow up if the symptoms continue, in spite of conservative treatment and management, for further workup, evaluation, consultation and treatment as clinically indicated and appropriate.  Patient/parent/caregiver verbalized understanding and agreement of plan as discussed.  All questions were addressed during visit.  Please see discharge instructions below for further details of plan.    Discharge Instructions      Please continue ibuprofen every 6-8 hours as needed for headache.  Your next dose, if needed, can be taken around 11 PM tonight.  The results of your STD testing today which screens for gonorrhea, chlamydia, and trichomonas will be posted to your MyChart account once it is complete.  This typically takes 2 to 4 days.  Please abstain from sexual intercourse of any kind, vaginal, oral or anal, until you have received the results of your STD testing.      The results of your HIV and syphilis blood tests will be made available to you once they are complete.  They will initially be posted to your MyChart account which typically takes 2 to 3 days.      If any of your results are abnormal, you will receive a phone call regarding treatment.  Prescriptions, if any are needed, will be provided for you at your pharmacy.      Thank you for visiting [facility] today.  We appreciate the opportunity to participate in your care.       This office note has been dictated using Teaching laboratory technician.  Unfortunately, this method of dictation can sometimes lead to typographical or grammatical errors.  I apologize for your inconvenience in advance if this occurs.  Please do not hesitate to reach out to me if clarification is needed.       Joesph Shaver Scales, PA-C 08/27/24 1729

## 2024-08-27 NOTE — ED Triage Notes (Signed)
 Patient presenting with headaches on and off for 5 years. Current flare started this past Sunday. Went to [place] and was given meds for migraines but states that has worn off. Patient states he did follow up with behavioral health and was cleared on that end.   Patient also wanting routine full panel STD testing including blood work.

## 2024-08-28 LAB — RPR: RPR Ser Ql: NONREACTIVE

## 2024-08-29 ENCOUNTER — Ambulatory Visit (HOSPITAL_COMMUNITY): Payer: Self-pay | Admitting: Emergency Medicine

## 2024-08-30 LAB — CYTOLOGY, (ORAL, ANAL, URETHRAL) ANCILLARY ONLY
Chlamydia: NEGATIVE
Comment: NEGATIVE
Comment: NEGATIVE
Comment: NORMAL
Neisseria Gonorrhea: NEGATIVE
Trichomonas: NEGATIVE

## 2024-09-02 NOTE — ED Provider Notes (Signed)
 ------------------------------------------------------------------------------- Attestation signed by Beverley Lynwood Goltz, MD at 09/02/2024  2:59 AM ATTENDING SUPERVISORY NOTE  I have personally seen and examined the patient, and discussed the plan of care with the resident.   I have reviewed the documentation of the resident and agree.   Patient presents for headache similar to prior.  No red flag symptoms, does not wake him up at night, not worse with lying flat or in the morning, no focal neurologic deficits on my exam, pupils are equal and reactive with 5 out of 5 strength bilateral upper and lower extremities.  He endorsed being agitated in the waiting room but denies any SI or HI.  Patient's father is with him and states he feels patient is safe at home and is not a threat to himself or anybody else.  Patient has follow-up with neurology in February, we will give him headache medications and reevaluate  I personally reevaluated patient who completely denies any SI or HI, states he feels much better would like to go home.  Patient's father is with him and will take him home and is comfortable with this disposition.  Patient's presentation is most consistent with acute presentation with potential threat to life or bodily function.     -------------------------------------------------------------------------------  [facility] Emergency Department Physician Note   Medical Decision Making   HPI/ROS:  Scott Vargas is a 28 y.o. male with a medical history as below History of Present Illness 28 year old male with recurrent severe headaches for 5 years, suspecting migraines. Current episode similar to previous ones. CT scan previously showed no abnormalities. No numbness, tingling, or difficulty with ambulation or eating. Mild nausea earlier, now resolved. Bright light exacerbates headache, no hallucinations. Neurologist appointment in 11/2024.  1-2 weeks ago,  experienced neck pain with popping sensation, now decreased but with stiffness and preserved mobility.    MDM Differentials considered: Tension, Migraine, Cluster, SAH, HTN, Encephalitis, CO, Arterial dissection, Acute glaucoma.  Physical exam is reassuring as patient is neurologically intact, hemodynamically stable.  On chart review patient has had CTs as recently as 11 days ago for his headaches that was unremarkable.  Given the chronicity of this problem and his clinical presentation believe most consistent with migraine.  Low concern for meningitis as afebrile.  No nuchal rigidity.  He does have follow-up with neurology.  Will provide migraine cocktail and on reassessment he feels much improved.  He remains neurologically intact does believe appropriate for discharge close follow-up with PCP.  Disposition:  DISCHARGE: Patient is felt to be medically appropriate for discharge at this time. Patient was informed of all pertinent physical exam, laboratory, and imaging findings. Patients suspected etiology of their symptom presentation was discussed with the patient and all questions were addressed. Patient was instructed to follow up with their primary care doctor for re-evaluation. Patient was given strict return precautions. The patient agreed with the discharge plan and verbalized understanding of return precautions. The patient was assured that we would be happy to reevaluate them at anytime if they had any concerns about their health regardless of whether it was related to this visit; and, ultimately, discharged in stable condition.   ED Disposition     ED Disposition  Discharge   Condition  Stable   Comment  --         The plan for this patient was discussed with Dr. Beverley Lynwood Goltz, MD , who voiced agreement and who oversaw evaluation and treatment of this patient.  Due to the patients current presenting symptoms, physical exam findings, and the workup stated above, it is  thought that the etiology of the patients current presentation is:  1. Migraine without status migrainosus, not intractable, unspecified migraine type      Clinical Complexity A medically appropriate history, review of systems, and physical exam was performed.  My independent interpretations of EKG, labs, and radiology are documented in the ED course above.    Patient's presentation is most consistent with exacerbation of chronic illness  This record was generated with the aid of voice dictation software, and may contain errors. Please contact me for any clarification or with any questions.    HPI/ROS      See MDM  Medical History[1]  Surgical History[2]    Physical Exam   Vitals:   09/01/24 2311 09/02/24 0000 09/02/24 0105 09/02/24 0225  BP: (!) 148/98 (!) 153/97 139/83 131/76  BP Location: Left arm     Patient Position: Sitting     Pulse: 99 97 90 80  Resp: 18 16 17 17   Temp: 97.9 F (36.6 C)     TempSrc:      SpO2: 100% 98% 98% 96%    Physical Exam Vitals and nursing note reviewed.  Constitutional:      Appearance: He is well-developed.  HENT:     Head: Normocephalic and atraumatic.     Mouth/Throat:     Mouth: Mucous membranes are moist.     Pharynx: Oropharynx is clear.  Eyes:     General: Lids are normal. Vision grossly intact. Gaze aligned appropriately.  Neck:     Trachea: Trachea and phonation normal.  Cardiovascular:     Rate and Rhythm: Normal rate and regular rhythm.  Pulmonary:     Effort: Pulmonary effort is normal.     Breath sounds: Normal breath sounds.  Abdominal:     General: Abdomen is flat. Bowel sounds are normal.     Palpations: Abdomen is soft.     Tenderness: There is no abdominal tenderness.  Skin:    General: Skin is warm.     Capillary Refill: Capillary refill takes less than 2 seconds.  Neurological:     Mental Status: He is alert and oriented to person, place, and time. Mental status is at baseline.     GCS: GCS eye  subscore is 4. GCS verbal subscore is 5. GCS motor subscore is 6.     Cranial Nerves: Cranial nerves 2-12 are intact.     Sensory: Sensation is intact.     Motor: Motor function is intact.     Coordination: Coordination is intact.     Gait: Gait is intact.      Procedures   If procedures were preformed on this patient, they are listed below:  Procedures     Please note that this documentation was produced with the assistance of voice-to-text technology and may contain errors.        [1] Past Medical History: Diagnosis Date  . Hypertension   [2] No past surgical history on file.

## 2024-09-07 ENCOUNTER — Ambulatory Visit (INDEPENDENT_AMBULATORY_CARE_PROVIDER_SITE_OTHER): Payer: MEDICAID | Admitting: Mental Health

## 2024-09-07 DIAGNOSIS — F411 Generalized anxiety disorder: Secondary | ICD-10-CM | POA: Diagnosis not present

## 2024-09-07 DIAGNOSIS — F259 Schizoaffective disorder, unspecified: Secondary | ICD-10-CM | POA: Diagnosis not present

## 2024-09-07 NOTE — Progress Notes (Signed)
 [facility]  Name: Scott Vargas Date: 09/07/2024 MRN: [id number] DOB: 02/27/1996 PCP: Arloa Elsie SAUNDERS, MD  Time spent: 47 minutes  Treatment:  individual therapy  Virtual Visit via Telehealth Note Connected with patient by a telemedicine/telehealth application, with their informed consent, and verified patient privacy and that I am speaking with the correct person using two identifiers. I discussed the limitations, risks, security and privacy concerns of performing psychotherapy and the availability of in person appointments. I also discussed with the patient that there may be a patient responsible charge related to this service. The patient expressed understanding and agreed to proceed. I discussed the treatment planning with the patient. The patient was provided an opportunity to ask questions and all were answered. The patient agreed with the plan and demonstrated an understanding of the instructions. The patient was advised to call  our office if  symptoms worsen or feel they are in a crisis state and need immediate contact.   Therapist Location: office Patient Location: home   Mental Status Exam:    Appearance:    Casual     Behavior:   Appropriate  Motor:   WNL  Speech/Language:    Clear and Coherent  Affect:   Full range   Mood:   Some anxiety, pleasant  Thought process:   Logical, linear  Thought content:     WNL  Sensory/Perceptual disturbances:     none  Orientation:   x4  Attention:   Good  Concentration:   Good  Memory:   Intact  Fund of knowledge:    Consistent with age and development  Insight:     Good  Judgment:    Good  Impulse Control:   Good   Reported Symptoms:  anxiety, depression, rumination, avoidant behavior (crowds at times), intrusive thoughts  Risk Assessment: Danger to Self:  No Self-injurious Behavior: No Danger to Others: Thoughts of harming others but denies any plan or intent to harm others or follow through  meeting. Duty to Warn:no Physical Aggression / Violence:No  Access to Firearms a concern: No  Gang Involvement:No  Patient / guardian was educated about steps to take if suicide or homicide risk level increases between visits: yes While future psychiatric events cannot be accurately predicted, the patient does not currently require acute inpatient psychiatric care and does not currently meet [place]  involuntary commitment criteria.  Diagnoses:    ICD-10-CM   1. Schizoaffective disorder, unspecified type (HCC)  F25.9     2. Generalized anxiety disorder  F41.1          Subjective:  Patient engaged in telehealth session via video.   He continues to cope with neck and head pain and took ativan  but it did not help as he was trying to reduce pain levels. He also used THC edible again which did not help; he stated he does not want to use them again.  He stated that he can pick up good or bad energy from people, feels this could be related to his mental health symptoms, which causes anxiety when in crowds; he expressed feeling his mood is stable, not as angry as often, but plans to talk with his provider about experiences and effectiveness of his psychiatric medication.   He stated that he went to [facility] ER 2 times over the last couple of weeks.  He stated that he initially went due to feeling anxious after having sexual intercourse with another man as he was worried he may have  contracted an STD .  He stated that he later learned from the hospital test.  He stated that he has met with this man more than 1 occasion over the last year.  He stated that he thinks he is bisexual, however this is in conflict with how he was raised and his religious beliefs about his feeling like he is making progress towards self-acceptance regarding his sexual orientation.   He stated that he had a second visit to [facility] due to the head/neck pain; he is scheduled to see a neurologist this  Thursday. He stated the headaches and neck pain have persisted for the last few years, even when his psychiatric meds have been helpful, therefore, he does not think its related to his mental health issues.  He has an appointment with his provider at [facility] tomorrow and plans to follow-up with his scheduled neurologist appointment. Related to self-care he continues to exercise as and spending time with a friend today.  He reports family relationships are going well and they are supportive as his father continues to attend some of his medical appointments.    Interventions:   Motivational interviewing, supportive therapy   Plan: Patient is to use CBT, mindfulness and coping skills to help decrease symptoms associated with their diagnosis.  Patient to continue to identify ways to cope and care for himself, attend doctor appointments about following through with his primary care physician as well as his psychiatry appointments.    Long-term goal:   Reduce overall level, frequency, and intensity of the feelings of anxiety in social situations for up to 3 months consecutively.   Decrease feelings of sadness and anxiety related to life stressors.   Short-term goal:  Patient to continue to work on identifying thoughts that make his anxiety increased in social situations and to continue to allow himself to engage in social situations to continue to try and work past his anxiety.   Patient to ground himself with rational, calming self talk when having intrusive thoughts. Increased feelings of self-worth by understanding and accepting his mental health challenges and life changes  Assessment of progress:  progressing   Lonni Fischer, [facility]

## 2024-09-21 ENCOUNTER — Ambulatory Visit: Payer: MEDICAID | Admitting: Mental Health

## 2024-09-21 DIAGNOSIS — F259 Schizoaffective disorder, unspecified: Secondary | ICD-10-CM

## 2024-09-21 DIAGNOSIS — F411 Generalized anxiety disorder: Secondary | ICD-10-CM

## 2024-09-21 NOTE — Progress Notes (Signed)
 [facility] Psych[facility] Note  Name: Scott Vargas Date: 09/07/2024 MRN: [id number] DOB: 17-Apr-1996 PCP: Arloa Elsie SAUNDERS, MD  Time spent: 40 minutes  Treatment:  individual therapy  Virtual Visit via Telehealth Note Connected with patient by a telemedicine/telehealth application, with their informed consent, and verified patient privacy and that I am speaking with the correct person using two identifiers. I discussed the limitations, risks, security and privacy concerns of performing psychotherapy and the availability of in person appointments. I also discussed with the patient that there may be a patient responsible charge related to this service. The patient expressed understanding and agreed to proceed. I discussed the treatment planning with the patient. The patient was provided an opportunity to ask questions and all were answered. The patient agreed with the plan and demonstrated an understanding of the instructions. The patient was advised to call  our office if  symptoms worsen or feel they are in a crisis state and need immediate contact.   Therapist Location: office Patient Location: home   Mental Status Exam:    Appearance:    Casual     Behavior:   Appropriate  Motor:   WNL  Speech/Language:    Clear and Coherent  Affect:   Full range   Mood:   Some anxiety, pleasant  Thought process:   Logical, linear  Thought content:     WNL  Sensory/Perceptual disturbances:     none  Orientation:   x4  Attention:   Good  Concentration:   Good  Memory:   Intact  Fund of knowledge:    Consistent with age and development  Insight:     Good  Judgment:    Good  Impulse Control:   Good   Reported Symptoms:  anxiety, depression, rumination, avoidant behavior (crowds at times), intrusive thoughts  Risk Assessment: Danger to Self:  No Self-injurious Behavior: No Danger to Others: Thoughts of harming others but denies any plan or intent to harm others or follow through  meeting. Duty to Warn:no Physical Aggression / Violence:No  Access to Firearms a concern: No  Gang Involvement:No  Patient / guardian was educated about steps to take if suicide or homicide risk level increases between visits: yes While future psychiatric events cannot be accurately predicted, the patient does not currently require acute inpatient psychiatric care and does not currently meet [place]  involuntary commitment criteria.  Diagnoses:    ICD-10-CM   1. Generalized anxiety disorder  F41.1     2. Schizoaffective disorder, unspecified type (HCC)  F25.9           Subjective:  Patient engaged in telehealth session via video.  He has been spending a couple of days with a  friend. About 2 weeks ago, he was at his grandfather's and his mother called to have a welfare check done on him.  When trying to explore reasons, he stated that he had difficulty remembering and how his short-term memory is challenging often.  He stated that he followed up with his prescribing provider a few days later where they discussed the matter as his mother had contacted the provider.  He stated that he is continuing to take psychiatric medications.  He stated that he continues to have social anxiety, feeling anxious and sometimes panicky when around others.  He stated this can happen at church, grocery stores and other places.  He stated that he has bad days where this can occur more frequently and other days where he has no anxiety in  the situations.  He had difficulty identifying contributing factors and encouraged him to consider between visits. He feels it could be related to his symptoms, how he continues to cope with some symptoms especially when around others where he identified, I get randomly emotional, referring to feeling anxious and feeling others energy. he acknowledged that this can be part of his symptoms and expressed hopefulness about his medications being even more helpful.  He denies  any feelings of anger, for the last month he stated that he feels his tendency to get angry it has been greatly diminished, attributes this to his use of THC Gummies at that time.  He continues to express wanting to abstain from use.  He denies any plan or intent to harm self or others reports that he feels his mood has been calm for the last several weeks. 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  Due to his stating that he is tired, which was noticeable, he elected to end the session early to get some rest.    Interventions:   Motivational interviewing, supportive therapy, motivational interviewing  Plan: Patient is to use CBT, mindfulness and coping skills to help decrease symptoms associated with their diagnosis.  Patient to continue to identify ways to cope and care for himself, attend doctor appointments about following through with his primary care physician as well as his psychiatry appointments.    Long-term goal:   Reduce overall level, frequency, and intensity of the feelings of anxiety in social situations for up to 3 months consecutively.   Decrease feelings of sadness and anxiety related to life stressors.   Short-term goal:  Patient to continue to work on identifying thoughts that make his anxiety increased in social situations and to continue to allow himself to engage in social situations to continue to try and work past his anxiety.   Patient to ground himself with rational, calming self talk when having intrusive thoughts. Increased feelings of self-worth by understanding and accepting his mental health challenges and life changes  Assessment of progress:  progressing   Lonni Fischer, [facility]

## 2024-10-05 ENCOUNTER — Ambulatory Visit: Payer: MEDICAID | Admitting: Mental Health

## 2024-10-05 DIAGNOSIS — F411 Generalized anxiety disorder: Secondary | ICD-10-CM | POA: Diagnosis not present

## 2024-10-05 NOTE — Progress Notes (Signed)
 [facility]  Name: Scott Vargas Date:  10/05/2024 MRN: [id number] DOB: 09/05/1996 PCP: Arloa Elsie SAUNDERS, MD  Time spent: 30 minutes  Treatment:  individual therapy  Virtual Visit via Telehealth Note Connected with patient by a telemedicine/telehealth application, with their informed consent, and verified patient privacy and that I am speaking with the correct person using two identifiers. I discussed the limitations, risks, security and privacy concerns of performing psychotherapy and the availability of in person appointments. I also discussed with the patient that there may be a patient responsible charge related to this service. The patient expressed understanding and agreed to proceed. I discussed the treatment planning with the patient. The patient was provided an opportunity to ask questions and all were answered. The patient agreed with the plan and demonstrated an understanding of the instructions. The patient was advised to call  our office if  symptoms worsen or feel they are in a crisis state and need immediate contact.   Therapist Location: office Patient Location: home   Mental Status Exam:    Appearance:    Casual     Behavior:   Appropriate  Motor:   WNL  Speech/Language:    Clear and Coherent  Affect:   Full range   Mood:   Some anxiety, pleasant  Thought process:   Logical, linear  Thought content:     WNL  Sensory/Perceptual disturbances:     none  Orientation:   x4  Attention:   Good  Concentration:   Good  Memory:   Intact  Fund of knowledge:    Consistent with age and development  Insight:     Good  Judgment:    Good  Impulse Control:   Good   Reported Symptoms:  anxiety, depression, rumination, avoidant behavior (crowds at times), intrusive thoughts  Risk Assessment: Danger to Self:  No Self-injurious Behavior: No Danger to Others: Thoughts of harming others but denies any plan or intent to harm others or follow through  meeting. Duty to Warn:no Physical Aggression / Violence:No  Access to Firearms a concern: No  Gang Involvement:No  Patient / guardian was educated about steps to take if suicide or homicide risk level increases between visits: yes While future psychiatric events cannot be accurately predicted, the patient does not currently require acute inpatient psychiatric care and does not currently meet [place]  involuntary commitment criteria.  Diagnoses:    ICD-10-CM   1. Generalized anxiety disorder  F41.1        Subjective:  Patient engaged in telehealth session via video.  Patient shared progress.  He stated that he follow through with his appointment with the neurologist and was prescribed a medication which has been very helpful to alleviate his migraine headaches.  He stated that he plans to attend his med management appointment tomorrow for his monthly injection.  Continues to report less agitation.  Stated that he has made efforts to spend time with a friend, get out of the house more often to try and work through some of his anxiety socially.  Facilitated his identifying associated experiences, thoughts where he stated that he sometimes continues to have moments where he feels bad energy from others.  He acknowledged how this can sometimes be associated with what he may perceive, how I communication transpired.  Discussed further how some experiences, perceptions he may have may not always be accurate where he acknowledged this being true.  He stated that he tries to be mindful of that when he  is in social situations.  He stated that he recently secured a part-time job and looks forward to the opportunity.   Interventions:   Motivational interviewing, supportive therapy, motivational interviewing  Plan: Patient is to use CBT, mindfulness and coping skills to help decrease symptoms associated with their diagnosis.  Patient to continue to identify ways to cope and care for himself, attend  doctor appointments about following through with his primary care physician as well as his psychiatry appointments.    Long-term goal:   Reduce overall level, frequency, and intensity of the feelings of anxiety in social situations for up to 3 months consecutively.   Decrease feelings of sadness and anxiety related to life stressors.   Short-term goal:  Patient to continue to work on identifying thoughts that make his anxiety increased in social situations and to continue to allow himself to engage in social situations to continue to try and work past his anxiety.   Patient to ground himself with rational, calming self talk when having intrusive thoughts. Increased feelings of self-worth by understanding and accepting his mental health challenges and life changes  Assessment of progress:  progressing   Lonni Fischer, [facility]

## 2024-10-19 ENCOUNTER — Ambulatory Visit: Payer: MEDICAID | Admitting: Mental Health

## 2024-11-10 ENCOUNTER — Ambulatory Visit: Payer: MEDICAID | Admitting: Mental Health

## 2024-11-10 DIAGNOSIS — F259 Schizoaffective disorder, unspecified: Secondary | ICD-10-CM | POA: Diagnosis not present

## 2024-11-10 DIAGNOSIS — F4010 Social phobia, unspecified: Secondary | ICD-10-CM | POA: Diagnosis not present

## 2024-11-10 NOTE — Progress Notes (Signed)
 [facility] Psych[facility] Note  Name: Lavan Imes Date:  11/10/24 MRN: [id number] DOB: March 31, 1996 PCP: Arloa Elsie SAUNDERS, MD  Time spent: 45 minutes  Treatment:  individual therapy  Virtual Visit via Telehealth Note Connected with patient by a telemedicine/telehealth application, with their informed consent, and verified patient privacy and that I am speaking with the correct person using two identifiers. I discussed the limitations, risks, security and privacy concerns of performing psychotherapy and the availability of in person appointments. I also discussed with the patient that there may be a patient responsible charge related to this service. The patient expressed understanding and agreed to proceed. I discussed the treatment planning with the patient. The patient was provided an opportunity to ask questions and all were answered. The patient agreed with the plan and demonstrated an understanding of the instructions. The patient was advised to call  our office if  symptoms worsen or feel they are in a crisis state and need immediate contact.   Therapist Location: office Patient Location: home   Mental Status Exam:    Appearance:    Casual     Behavior:   Appropriate  Motor:   WNL  Speech/Language:    Clear and Coherent  Affect:   Full range   Mood:   Some anxiety, pleasant  Thought process:   Logical, linear  Thought content:     WNL  Sensory/Perceptual disturbances:     none  Orientation:   x4  Attention:   Good  Concentration:   Good  Memory:   Intact  Fund of knowledge:    Consistent with age and development  Insight:     Good  Judgment:    Good  Impulse Control:   Good   Reported Symptoms:  anxiety, depression, rumination, avoidant behavior (crowds at times), intrusive thoughts  Risk Assessment: Danger to Self:  No Self-injurious Behavior: No Danger to Others: Thoughts of harming others but denies any plan or intent to harm others or follow through  meeting. Duty to Warn:no Physical Aggression / Violence:No  Access to Firearms a concern: No  Gang Involvement:No  Patient / guardian was educated about steps to take if suicide or homicide risk level increases between visits: yes While future psychiatric events cannot be accurately predicted, the patient does not currently require acute inpatient psychiatric care and does not currently meet [place]  involuntary commitment criteria.  Diagnoses:    ICD-10-CM   1. Social anxiety disorder  F40.10     2. Schizoaffective disorder, unspecified type (HCC)  F25.9         Subjective:  Patient engaged in telehealth session via video.  Addressed progress per patient shared how he obtained part-time employment about 1 month ago.  He stated that he has adjusted well to the job so far, has had some challenges with his social anxiety.  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  Overall, he considers the experience of the job as successful overall for himself.  He identified the need to allow himself to recharge when possible such as being off work and spending time with friends which he has done little of since getting the job due to his work hours.  Other ways to cope, discussing  diaphragmatic breathing to allow for him to offload stress and negative energy.  He feels this will be helpful to utilize as he can get stress and frustrated.  Assessed family relationships which he stated have been going well overall.  He reports his medications have been effective, continues to see his provider monthly.  Interventions:   Motivational interviewing, supportive therapy, motivational interviewing  Plan: Patient is to use CBT,  mindfulness and coping skills to help decrease symptoms associated with their diagnosis.  Patient to continue to identify ways to cope and care for himself, attend doctor appointments about following through with his primary care physician as well as his psychiatry appointments.    Long-term goal:   Reduce overall level, frequency, and intensity of the feelings of anxiety in social situations for up to 3 months consecutively.   Decrease feelings of sadness and anxiety related to life stressors.   Short-term goal:  Patient to continue to work on identifying thoughts that make his anxiety increased in social situations and to continue to allow himself to engage in social situations to continue to try and work past his anxiety.   Patient to ground himself with rational, calming self talk when having intrusive thoughts. Increased feelings of self-worth by understanding and accepting his mental health challenges and life changes  Assessment of progress:  progressing   Lonni Fischer, [facility]

## 2024-11-24 ENCOUNTER — Ambulatory Visit: Payer: MEDICAID | Admitting: Mental Health

## 2024-11-24 DIAGNOSIS — F4010 Social phobia, unspecified: Secondary | ICD-10-CM
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
## 2024-11-25 NOTE — Addendum Note (Signed)
 Addended by: BERNARDO BRUCKNER D on: 11/25/2024 09:41 AM   Modules accepted: Level of Service

## 2024-12-08 ENCOUNTER — Ambulatory Visit: Payer: MEDICAID | Admitting: Mental Health

## 2024-12-22 ENCOUNTER — Ambulatory Visit: Payer: MEDICAID | Admitting: Mental Health

## 2025-01-05 ENCOUNTER — Ambulatory Visit: Payer: MEDICAID | Admitting: Mental Health

## 2025-01-19 ENCOUNTER — Ambulatory Visit: Payer: MEDICAID | Admitting: Mental Health
